# Patient Record
Sex: Female | Born: 1955 | Hispanic: Yes | Marital: Single | Smoking: Never smoker
Health system: Southern US, Community
[De-identification: ages and names within clinical notes are randomized; demographics above are authoritative.]

## PROBLEM LIST (undated history)

## (undated) DIAGNOSIS — J479 Bronchiectasis, uncomplicated: Secondary | ICD-10-CM

## (undated) DIAGNOSIS — I10 Essential (primary) hypertension: Secondary | ICD-10-CM

## (undated) HISTORY — PX: PLACEMENT OF BREAST IMPLANTS: SHX6334

## (undated) HISTORY — DX: Essential (primary) hypertension: I10

---

## 2016-11-26 DIAGNOSIS — A150 Tuberculosis of lung: Secondary | ICD-10-CM

## 2016-11-26 HISTORY — DX: Tuberculosis of lung: A15.0

## 2020-02-20 ENCOUNTER — Ambulatory Visit: Payer: Self-pay | Attending: Internal Medicine

## 2020-02-20 DIAGNOSIS — Z23 Encounter for immunization: Secondary | ICD-10-CM

## 2020-02-20 NOTE — Progress Notes (Signed)
   Covid-19 Vaccination Clinic  Name:  Victoria Mendoza    MRN: [id number] DOB: June 07, 1956  02/20/2020  [name] was observed post Covid-19 immunization for 15 minutes without incident. She was provided with Vaccine Information Sheet and instruction to access the V-Safe system.   [name] was instructed to call 911 with any severe reactions post vaccine: Marland Kitchen Difficulty breathing  . Swelling of face and throat  . A fast heartbeat  . A bad rash all over body  . Dizziness and weakness   '

## 2020-03-14 ENCOUNTER — Other Ambulatory Visit: Payer: Self-pay

## 2020-03-14 ENCOUNTER — Ambulatory Visit: Payer: 59 | Admitting: Pulmonary Disease

## 2020-03-14 ENCOUNTER — Encounter: Payer: Self-pay | Admitting: Pulmonary Disease

## 2020-03-14 ENCOUNTER — Ambulatory Visit (INDEPENDENT_AMBULATORY_CARE_PROVIDER_SITE_OTHER): Payer: 59

## 2020-03-14 VITALS — BP 122/66 | HR 87 | Temp 98.9°F | Ht 61.0 in | Wt 111.2 lb

## 2020-03-14 DIAGNOSIS — R059 Cough, unspecified: Secondary | ICD-10-CM

## 2020-03-14 DIAGNOSIS — R05 Cough: Secondary | ICD-10-CM | POA: Diagnosis not present

## 2020-03-14 LAB — BASIC METABOLIC PANEL
BUN: 9 mg/dL (ref 6–23)
CO2: 32 mEq/L (ref 19–32)
Calcium: 9.4 mg/dL (ref 8.4–10.5)
Chloride: 93 mEq/L — ABNORMAL LOW (ref 96–112)
Creatinine, Ser: 0.58 mg/dL (ref 0.40–1.20)
GFR: 104.24 mL/min (ref >60.00)
Glucose, Bld: 117 mg/dL — ABNORMAL HIGH (ref 70–99)
Potassium: 3.9 mEq/L (ref 3.5–5.1)
Sodium: 131 mEq/L — ABNORMAL LOW (ref 135–145)

## 2020-03-14 LAB — HEPATIC FUNCTION PANEL
ALT: 17 U/L (ref 0–35)
AST: 19 U/L (ref 0–37)
Albumin: 3.8 g/dL (ref 3.5–5.2)
Alkaline Phosphatase: 77 U/L (ref 39–117)
Bilirubin, Direct: 0 mg/dL (ref 0.0–0.3)
Total Bilirubin: 0.3 mg/dL (ref 0.2–1.2)
Total Protein: 8.4 g/dL — ABNORMAL HIGH (ref 6.0–8.3)

## 2020-03-14 LAB — CBC WITH DIFFERENTIAL/PLATELET
Basophils Absolute: 0.2 10*3/uL — ABNORMAL HIGH (ref 0.0–0.1)
Basophils Relative: 1.2 % (ref 0.0–3.0)
Eosinophils Absolute: 0.2 10*3/uL (ref 0.0–0.7)
Eosinophils Relative: 1.3 % (ref 0.0–5.0)
HCT: 40.1 % (ref 36.0–46.0)
Hemoglobin: 13.3 g/dL (ref 12.0–15.0)
Lymphocytes Relative: 16.9 % (ref 12.0–46.0)
Lymphs Abs: 2.2 10*3/uL (ref 0.7–4.0)
MCHC: 33.2 g/dL (ref 30.0–36.0)
MCV: 87.1 fl (ref 78.0–100.0)
Monocytes Absolute: 1.3 10*3/uL — ABNORMAL HIGH (ref 0.1–1.0)
Monocytes Relative: 10.2 % (ref 3.0–12.0)
Neutro Abs: 9.2 10*3/uL — ABNORMAL HIGH (ref 1.4–7.7)
Neutrophils Relative %: 70.4 % (ref 43.0–77.0)
Platelets: 492 10*3/uL — ABNORMAL HIGH (ref 150.0–400.0)
RBC: 4.6 Mil/uL (ref 3.87–5.11)
RDW: 13.2 % (ref 11.5–15.5)
WBC: 13 10*3/uL — ABNORMAL HIGH (ref 4.0–10.5)

## 2020-03-14 NOTE — Progress Notes (Signed)
Victoria Mendoza    [id number]    1956-08-16  Primary Care Physician:Patient, No Pcp Per  Referring Physician: No referring provider defined for this encounter.  Chief complaint:   Patient with chronic cough, fatigue  HPI: Patient was diagnosed with tuberculosis in 2018 Was treated with 6 months of antituberculous medications in [place] She stated she was given the all clear following her treatment  Chest x-ray at the time did reveal left lower lobe scarring  Cough has been for almost a couple years No significant weight loss No fevers, no chills Appetite is good No night sweats  Cough is productive of small amount of yellow occasionally green phlegm in the early morning She is not feeling acutely ill at present  She has not been around anybody with a chronic cough or febrile illness  She works as an Astronomer  Never smoked Social alcohol use, glass of wine here and there  History of hypertension-well-controlled  Outpatient Encounter Medications as of 03/14/2020  Medication Sig  . bisoprolol-hydrochlorothiazide (ZIAC) 2.5-6.25 MG tablet Take 1 tablet by mouth daily.   No facility-administered encounter medications on file as of 03/14/2020.    Allergies as of 03/14/2020  . (Not on File)    Past Medical History:  Diagnosis Date  . Hypertension     History reviewed. No pertinent surgical history.  History reviewed. No pertinent family history.  Social History   Socioeconomic History  . Marital status: Single    Spouse name: Not on file  . Number of children: Not on file  . Years of education: Not on file  . Highest education level: Not on file  Occupational History  . Not on file  Tobacco Use  . Smoking status: Never Smoker  . Smokeless tobacco: Never Used  Substance and Sexual Activity  . Alcohol use: Not on file  . Drug use: Not on file  . Sexual activity: Not on file  Other Topics Concern  . Not on file  Social History Narrative  .  Not on file   Social Determinants of Health   Financial Resource Strain:   . Difficulty of Paying Living Expenses:   Food Insecurity:   . Worried About Charity fundraiser in the Last Year:   . Arboriculturist in the Last Year:   Transportation Needs:   . Film/video editor (Medical):   Marland Kitchen Lack of Transportation (Non-Medical):   Physical Activity:   . Days of Exercise per Week:   . Minutes of Exercise per Session:   Stress:   . Feeling of Stress :   Social Connections:   . Frequency of Communication with Friends and Family:   . Frequency of Social Gatherings with Friends and Family:   . Attends Religious Services:   . Active Member of Clubs or Organizations:   . Attends Archivist Meetings:   Marland Kitchen Marital Status:   Intimate Partner Violence:   . Fear of Current or Ex-Partner:   . Emotionally Abused:   Marland Kitchen Physically Abused:   . Sexually Abused:     Review of Systems  Constitutional: Positive for fatigue.  HENT: Negative.   Eyes: Negative.   Respiratory: Positive for cough. Negative for chest tightness and wheezing.   Cardiovascular: Negative.   Gastrointestinal: Negative.   Endocrine: Negative.     Vitals:   03/14/20 1437  BP: 122/66  Pulse: 87  Temp: 98.9 F (37.2 C)  SpO2: 97%  Physical Exam  Constitutional: She appears well-developed.  HENT:  Head: Normocephalic and atraumatic.  Eyes: Pupils are equal, round, and reactive to light. Conjunctivae and EOM are normal.  Neck: No tracheal deviation present. No thyromegaly present.  Cardiovascular: Normal rate.  Pulmonary/Chest: Effort normal and breath sounds normal. No respiratory distress. She has no wheezes. She has no rales. She exhibits no tenderness.  Abdominal: Soft.  Musculoskeletal:     Cervical back: Normal range of motion and neck supple.  Neurological: She is alert. No cranial nerve deficit.  Skin: Skin is warm and dry.  Psychiatric: She has a normal mood and affect.   Assessment:    Cough Fatigue -Context of previous treatment for TB -Chronic cough -No weight loss, appetite maintained, no fevers no chills  -Has tried holistic therapy to no avail  Possibility of recurrence of TB Pneumonia Chronic bronchitis less likely  Plan/Recommendations: Sputum for AFB x3 Obtain chest x-ray Obtain BMP, CBC, LFT  CT scan will be considered depending on chest x-ray findings  Bronchoscopy discussed as an option if not able to produce adequate sputum's  Tentative follow-up in 4 weeks   Virl Diamond MD [facility] 03/14/2020, 3:03 PM  CC: No ref. provider found

## 2020-03-14 NOTE — Patient Instructions (Signed)
Cough, fatigue Previous history of treated tuberculosis  Sputum follow-up AFB and culture Chest x-ray  CBC, BMP, LFT  I will see you back in 4 weeks  Depending on chest x-ray findings, we may need a CT scan of the chest Bronchoscopy will also be considered if you are not able to provide adequate sputum samples, having the CT scan of the chest will help guide bronchoscopy sampling

## 2020-03-15 ENCOUNTER — Telehealth: Payer: Self-pay | Admitting: Pulmonary Disease

## 2020-03-15 DIAGNOSIS — J849 Interstitial pulmonary disease, unspecified: Secondary | ICD-10-CM

## 2020-03-15 NOTE — Telephone Encounter (Signed)
HRCT ordered per Dr Kary Kos request  Will await pt's call back since he has already left msg  Will try her again 4/21

## 2020-03-15 NOTE — Telephone Encounter (Signed)
Called patient, left message on generic voicemail for patient to call back  Abnormal chest x-ray with scarring Unclear what is new  As discussed during the office visit  We will go ahead and order a CT scan of the chest   Kindly go ahead and order a high-resolution CT scan of the chest-done without contrast

## 2020-03-15 NOTE — Telephone Encounter (Signed)
Called [place] [facility] and spoke to [place]. Called for a call report on pt's CXR that was completed 4/19. Impression below:   IMPRESSION: 1. Findings suggestive of extensive underlying chronic lung disease, however no prior exams are available for comparison. 2. Bilateral upper lobe bulla, left greater than right. Possible not definite fluid level in the left apical bulla. 3. Diffuse bronchial thickening and interstitial coarsening, of unknown acuity. Streaky bilateral suprahilar opacities may be atelectasis or scarring. 4. Normal heart size with bilateral hilar retraction. 5. In the absence of prior exams, consider further evaluation with chest CT. High-resolution chest CT could be considered for evaluation of interstitial lung disease.   Electronically Signed   By: Narda Rutherford M.D.   On: 03/15/2020 01:58    Dr. Wynona Neat, please advise. Thanks.

## 2020-03-16 NOTE — Telephone Encounter (Signed)
Called and spoke to pt. Informed her of the results and recs per Dr. Wynona Neat. Pt aware order has already been placed and she will be contacted soon to get the scan scheduled. Nothing further needed at this time. Will sign off.

## 2020-03-16 NOTE — Telephone Encounter (Signed)
Pt called back for results, please return call

## 2020-03-17 ENCOUNTER — Other Ambulatory Visit: Payer: 59

## 2020-03-17 DIAGNOSIS — R05 Cough: Secondary | ICD-10-CM

## 2020-03-17 DIAGNOSIS — R059 Cough, unspecified: Secondary | ICD-10-CM

## 2020-03-18 ENCOUNTER — Other Ambulatory Visit: Payer: 59

## 2020-03-18 DIAGNOSIS — R059 Cough, unspecified: Secondary | ICD-10-CM

## 2020-03-18 DIAGNOSIS — R05 Cough: Secondary | ICD-10-CM

## 2020-03-19 ENCOUNTER — Telehealth: Payer: Self-pay | Admitting: Family Medicine

## 2020-03-19 ENCOUNTER — Telehealth: Payer: Self-pay | Admitting: Pulmonary Disease

## 2020-03-19 NOTE — Telephone Encounter (Signed)
[facility] is calling to report a positive sputum Cx. 4+ acid-fast bacilli seen using fluorochrome method on 03/17/20. AFB Cx pending.  Pt does not have a PCP. Reviewing notes, she has followed with pulmonologist and testing was done because of cough for a couple of years. She has been previously treated for TB.  I could not find information about pulmonologist or ID on call.  I am routing information to Dr.Olalere.  Rosene Pilling [place], MD

## 2020-03-19 NOTE — Telephone Encounter (Signed)
Called patient today  Called LAB to request for probes - was told it will reflex to probe for all positive samples

## 2020-03-19 NOTE — Telephone Encounter (Signed)
Advised patient to isolate  She lives alone  Not in contact with anybody at present she stated  We will call the lab to request for probes for Mycobacterium and Mycobacterium TB

## 2020-03-21 ENCOUNTER — Telehealth: Payer: Self-pay | Admitting: Pulmonary Disease

## 2020-03-21 NOTE — Telephone Encounter (Signed)
Dr. O, please advise on this. 

## 2020-03-22 ENCOUNTER — Telehealth: Payer: Self-pay | Admitting: Pulmonary Disease

## 2020-03-22 NOTE — Telephone Encounter (Signed)
Spoke with [name] at [facility]. Made aware that a culture is being done as the report states that this is preliminary report and the final report could take up to 6 weeks to get back.

## 2020-03-22 NOTE — Telephone Encounter (Signed)
Pt has a f/u scheduled 5/20.  Due to the positive AFB smear, is there any current tx that pt needs to do or wait until her f/u with you to discuss?

## 2020-03-22 NOTE — Telephone Encounter (Signed)
Yes.  Patient was fully treated 2 years ago, but, risk of recurrence exists

## 2020-03-23 NOTE — Telephone Encounter (Signed)
Need to wait for the AFB probes before any intervention

## 2020-03-23 NOTE — Telephone Encounter (Signed)
Spoke with [name] and notified of response per Dr Wynona Neat  She verbalized understanding  Nothing further needed

## 2020-03-25 ENCOUNTER — Inpatient Hospital Stay: Admission: RE | Admit: 2020-03-25 | Payer: 59 | Source: Ambulatory Visit

## 2020-04-14 ENCOUNTER — Encounter: Payer: Self-pay | Admitting: Pulmonary Disease

## 2020-04-14 ENCOUNTER — Ambulatory Visit: Payer: 59 | Admitting: Pulmonary Disease

## 2020-04-14 ENCOUNTER — Other Ambulatory Visit: Payer: Self-pay

## 2020-04-14 VITALS — BP 122/68 | HR 69 | Temp 98.3°F | Ht 62.0 in | Wt 109.6 lb

## 2020-04-14 DIAGNOSIS — R0600 Dyspnea, unspecified: Secondary | ICD-10-CM

## 2020-04-14 NOTE — Patient Instructions (Signed)
Mycobacterium avium infection  We will get a CT scan of the chest Consultation with infectious disease  Will need treated with 3 drug regimen  Will follow in about 6 weeks

## 2020-04-14 NOTE — Progress Notes (Signed)
Victoria Mendoza    [id number]    [birth year]  Primary Care Physician:Patient, No Pcp Per  Referring Physician: No referring provider defined for this encounter.  Chief complaint:   Patient with chronic cough, fatigue  HPI: Symptoms are unchanged since her last visit She is still profoundly fatigued, chronic cough with expectoration's in the mornings  patient was diagnosed with tuberculosis in 2018 Was treated with 6 months of antituberculous medications in [place] She stated she was given the all clear following her treatment  Chest x-ray at the time did reveal left lower lobe scarring  Cough has been for almost a couple years No significant weight loss No fevers, no chills  Appetite is good No night sweats  Continues to feel well with no acute illness  She has not been around anybody with a chronic cough or febrile illness  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 bisoprolol-hydrochlorothiazide (ZIAC) 2.5-6.25 MG tablet Take 1 tablet by mouth daily.   No facility-administered encounter medications on file as of 04/14/2020.    Allergies as of 04/14/2020  . (Not on File)    Past Medical History:  Diagnosis Date  . Hypertension     History reviewed. No pertinent surgical history.  History reviewed. No pertinent family history.  Social History   Socioeconomic History  . Marital status: Single    Spouse name: Not on file  . Number of children: Not on file  . Years of education: Not on file  . Highest education level: Not on file  Occupational History  . Not on file  Tobacco Use  . Smoking status: Never Smoker  . Smokeless tobacco: Never Used  Substance and Sexual Activity  . Alcohol use: Not on file  . Drug use: Not on file  . Sexual activity: Not on file  Other Topics Concern  .  Not on file  Social History Narrative  . Not on file   Social Determinants of Health   Financial Resource Strain:   . Difficulty of Paying Living Expenses:   Food Insecurity:   . Worried About Charity fundraiser in the Last Year:   . Arboriculturist in the Last Year:   Transportation Needs:   . Film/video editor (Medical):   Marland Kitchen Lack of Transportation (Non-Medical):   Physical Activity:   . Days of Exercise per Week:   . Minutes of Exercise per Session:   Stress:   . Feeling of Stress :   Social Connections:   . Frequency of Communication with Friends and Family:   . Frequency of Social Gatherings with Friends and Family:   . Attends Religious Services:   . Active Member of Clubs or Organizations:   . Attends Archivist Meetings:   Marland Kitchen Marital Status:   Intimate Partner Violence:   . Fear of Current or Ex-Partner:   . Emotionally Abused:   Marland Kitchen Physically Abused:   . Sexually Abused:     Review of Systems  Constitutional: Positive for fatigue.  HENT: Negative.   Eyes: Negative.   Respiratory: Positive for cough. Negative for chest tightness and wheezing.   Cardiovascular: Negative.   Gastrointestinal: Negative.   Endocrine: Negative.     Vitals:   04/14/20 1336  BP: 122/68  Pulse: 69  Temp: 98.3 F (  36.8 C)  SpO2: 95%     Physical Exam  Constitutional: She appears well-developed.  HENT:  Head: Normocephalic and atraumatic.  Eyes: Pupils are equal, round, and reactive to light. Conjunctivae and EOM are normal.  Neck: No tracheal deviation present. No thyromegaly present.  Cardiovascular: Normal rate.  Pulmonary/Chest: Effort normal and breath sounds normal. No respiratory distress. She has no wheezes. She has no rales. She exhibits no tenderness.  Decreased air movement bilaterally  Abdominal: Soft.   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 provider found

## 2020-04-15 ENCOUNTER — Ambulatory Visit (INDEPENDENT_AMBULATORY_CARE_PROVIDER_SITE_OTHER)
Admission: RE | Admit: 2020-04-15 | Discharge: 2020-04-15 | Disposition: A | Discharge status: Home / Self Care | Payer: 59 | Source: Ambulatory Visit | Attending: Pulmonary Disease | Admitting: Pulmonary Disease

## 2020-04-15 DIAGNOSIS — J849 Interstitial pulmonary disease, unspecified: Secondary | ICD-10-CM

## 2020-04-19 ENCOUNTER — Telehealth: Payer: Self-pay | Admitting: Pulmonary Disease

## 2020-04-19 NOTE — Telephone Encounter (Signed)
Called [facility] and spoke with Nicole Cella in regards to the results of pt's mycobacteria culture. Per Nicole Cella, sputum was collected 03/17/20 and it showed many (4+) acid-fast bacilli seen.  Isolate 1: mycobacterium, avium-intracellulare group Isolate 2: Acid-fast basillus  These results can be seen under lab tab of pt's chart. Status is showing still as preliminary.  Routing to Dr. Wynona Neat.

## 2020-04-19 NOTE — Telephone Encounter (Signed)
Noted. Nothing further needed. 

## 2020-04-19 NOTE — Telephone Encounter (Signed)
Aware of results  Patient already referred to infectious disease to assist with treatment of Mycobacterium avium infection

## 2020-04-21 ENCOUNTER — Encounter: Payer: Self-pay | Admitting: Internal Medicine

## 2020-04-21 ENCOUNTER — Other Ambulatory Visit: Payer: Self-pay

## 2020-04-21 ENCOUNTER — Ambulatory Visit (INDEPENDENT_AMBULATORY_CARE_PROVIDER_SITE_OTHER): Payer: 59 | Admitting: Internal Medicine

## 2020-04-21 DIAGNOSIS — I10 Essential (primary) hypertension: Secondary | ICD-10-CM

## 2020-04-21 DIAGNOSIS — A310 Pulmonary mycobacterial infection: Secondary | ICD-10-CM

## 2020-04-21 DIAGNOSIS — I70209 Unspecified atherosclerosis of native arteries of extremities, unspecified extremity: Secondary | ICD-10-CM | POA: Diagnosis not present

## 2020-04-21 DIAGNOSIS — Z8611 Personal history of tuberculosis: Secondary | ICD-10-CM | POA: Diagnosis not present

## 2020-04-21 MED ORDER — AZITHROMYCIN 500 MG PO TABS: 500.0000 mg | ORAL_TABLET | Freq: Every day | ORAL | 11 refills | Status: DC

## 2020-04-21 MED ORDER — ETHAMBUTOL HCL 400 MG PO TABS: ORAL_TABLET | ORAL | 11 refills | Status: DC

## 2020-04-21 MED ORDER — RIFAMPIN 300 MG PO CAPS: 600.0000 mg | ORAL_CAPSULE | Freq: Every day | ORAL | 11 refills | Status: DC

## 2020-04-21 NOTE — Progress Notes (Signed)
[facility]  Reason for Consult: Mycobacterium avium pneumonia Referring Provider: Dr. 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  Lab to order antibiotic susceptibilities for her isolate.  She will follow-up in 4 to 6 weeks.  Plan: 1. Start daily azithromycin, rifampin and ethambutol (I have talked to her about potential side effects) 2. Antibiotic susceptibilities 3. Follow-up in 4 to 6 weeks  Patient Active Problem List   Diagnosis Date Noted  . 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 History of tuberculosis 04/21/2020  . Hypertension 04/21/2020  . Atherosclerotic peripheral vascular disease (HCC) 04/21/2020    Patient's Medications  New Prescriptions   AZITHROMYCIN (ZITHROMAX) 500 MG TABLET    Take 1 tablet (500 mg total) by mouth daily.   ETHAMBUTOL (MYAMBUTOL) 400 MG TABLET    Take 2 tablets (800 mg total) by mouth daily, THEN 2 tablets (800 mg total) daily.   RIFAMPIN (RIFADIN) 300 MG CAPSULE    Take 2 capsules (600 mg total) by mouth daily.  Previous Medications   BISOPROLOL-HYDROCHLOROTHIAZIDE (ZIAC) 2.5-6.25 MG TABLET    Take 1 tablet by mouth daily.  Modified Medications   No medications on file  Discontinued Medications   No medications on file    HPI: Victoria Mendoza is a 64 y.o. female with a history of hypertension and tuberculosis.  She developed active tuberculosis in 2005 and received 6 months of multidrug treatment.  She was deemed to have been cured.  However, she had a relapse of tuberculosis in 2018 and received a second 36-month course of therapy, again with a believe that she was cured.  She received these treatments while living in her native [place].  She now  works locally as a Research officer, trade union.  She says other than her hypertension and tuberculosis she has been very healthy throughout her life.  She loves to exercise and stays very active but several months ago began to notice that her chronic cough was getting worse and it was more productive of green sputum.  She has also noticed worsening dyspnea on exertion and fatigue.  She has had to give up many of her routine activities.  She has not had any hemoptysis.  She was evaluated last month and a chest x-ray showed extensive chronic lung disease bilaterally.  Chest CT scan showed extensive bilateral cavitation and nodularity.  Sputum AFB culture has grown Mycobacterium avium.  Review of Systems: Review of Systems  Constitutional: Positive for malaise/fatigue and weight loss. Negative for chills, diaphoresis and fever.  HENT: Negative for sore throat.   Respiratory: Positive for cough, sputum production, shortness of breath and wheezing. Negative for hemoptysis.   Cardiovascular: Negative for chest pain.  Gastrointestinal: Negative for abdominal pain, diarrhea, nausea and vomiting.  Genitourinary: Negative for dysuria.  Musculoskeletal: Negative for back pain.  Skin: Negative for rash.  Neurological: Negative for headaches.  Psychiatric/Behavioral: Negative for depression.      Past Medical History:  Diagnosis Date  . Hypertension     Social History   Tobacco Use  . Smoking status: Never Smoker  . Smokeless tobacco: Never Used  Substance Use Topics  . Alcohol use: Never  . Drug use: Never    History reviewed. No pertinent family history. No Known Allergies  OBJECTIVE: Vitals:   04/21/20 1450  BP: 138/72  Pulse: 90  Temp: 97.8 F (36.6 C)  SpO2: 98%  Weight: 111 lb (50.3 kg)  Height: 5\' 1"  (1.549 m)   Body mass index is 20.97 kg/m.   Physical Exam Constitutional:      Comments: She is very pleasant and in no distress.  Cardiovascular:     Rate and Rhythm: Normal  rate and regular rhythm.     Heart sounds: No murmur.  Pulmonary:     Effort: Pulmonary effort is normal.     Breath sounds: Wheezing and rales present.  Abdominal:     Palpations: Abdomen is soft.     Tenderness: There is no abdominal tenderness.  Musculoskeletal:        General: No swelling or tenderness.  Skin:    Findings: No rash.  Neurological:     General: No focal deficit present.  Psychiatric:        Mood and Affect: Mood normal.     Michel Bickers, MD [facility] for [place] [phone] pager   [phone] cell 04/21/2020, 3:11 PM

## 2020-04-22 ENCOUNTER — Telehealth: Payer: Self-pay | Admitting: *Deleted

## 2020-04-22 MED FILL — rifAMPin 300 MG CAPS: 300 | 30 days supply | Qty: 60 | Fill #0

## 2020-04-22 MED FILL — AZITHROMYCIN 500 MG TABS: 500 | 30 days supply | Qty: 30 | Fill #0

## 2020-04-22 NOTE — Telephone Encounter (Signed)
Received notice from [facility] pharmacy that ethambutol is not on formulary. Will send prior authorization request to [facility]. Andree Coss, RN

## 2020-04-22 NOTE — Telephone Encounter (Signed)
PA initiated via covermymeds for myambutol. Andree Coss, RN

## 2020-04-26 MED FILL — ETHAMBUTOL HCL 400 MG TAB: 400 | 15 days supply | Qty: 60 | Fill #0

## 2020-04-26 NOTE — Telephone Encounter (Signed)
[facility] Patient Advocate Encounter  Response from [facility] indicates the medication needs to be processed generic. I ran a claim at [facility] and it went through for $5.00. Communicated this information to the pharmacy and they are reprocessing. The medication in stock at [facility] may alter the patient's copay amount.   A new prior authorization is not needed at this time. Patient assistance can be looked at if needed through [facility].

## 2020-05-17 ENCOUNTER — Telehealth: Payer: Self-pay | Admitting: Pulmonary Disease

## 2020-05-17 NOTE — Telephone Encounter (Signed)
Called [facility]  The sputum culture for mycobacteria  fluorchrome smear from 03/17/20 was pos for MAC  Will forward to Dr Ander Slade to make him aware

## 2020-05-18 MED FILL — ETHAMBUTOL HCL 400 MG TAB: 400 | 15 days supply | Qty: 60 | Fill #1

## 2020-05-18 MED FILL — rifAMPin 300 MG CAPS: 300 | 30 days supply | Qty: 60 | Fill #1

## 2020-05-18 MED FILL — AZITHROMYCIN 500 MG TABS: 500 | 30 days supply | Qty: 30 | Fill #1

## 2020-05-18 NOTE — Telephone Encounter (Signed)
aware

## 2020-05-18 NOTE — Telephone Encounter (Signed)
ATC [facility], was placed on a long hold. Will try back.

## 2020-05-18 NOTE — Telephone Encounter (Signed)
Dawn from [employer] [facility] is calling with lab results. Reference number is [id number] N, Phone number is [phone].

## 2020-05-19 ENCOUNTER — Telehealth: Payer: Self-pay | Admitting: Pulmonary Disease

## 2020-05-19 NOTE — Telephone Encounter (Signed)
error 

## 2020-06-06 ENCOUNTER — Other Ambulatory Visit: Payer: Self-pay

## 2020-06-06 ENCOUNTER — Encounter: Payer: Self-pay | Admitting: Internal Medicine

## 2020-06-06 ENCOUNTER — Ambulatory Visit (INDEPENDENT_AMBULATORY_CARE_PROVIDER_SITE_OTHER): Payer: 59 | Admitting: Internal Medicine

## 2020-06-06 DIAGNOSIS — A310 Pulmonary mycobacterial infection: Secondary | ICD-10-CM

## 2020-06-06 NOTE — Assessment & Plan Note (Signed)
She is tolerating her new 3 drug regimen well and is better.  She will continue her antibiotics submit a new sputum sample for repeat culture.  She will follow-up here in 4 to 6 weeks.

## 2020-06-06 NOTE — Progress Notes (Signed)
[facility]  Patient Active Problem List   Diagnosis Date Noted  . 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 History of tuberculosis 04/21/2020  . Hypertension 04/21/2020  . Atherosclerotic peripheral vascular disease (HCC) 04/21/2020    Patient's Medications  New Prescriptions   No medications on file  Previous Medications   AZITHROMYCIN (ZITHROMAX) 500 MG TABLET    Take 1 tablet (500 mg total) by mouth daily.   BISOPROLOL-HYDROCHLOROTHIAZIDE (ZIAC) 2.5-6.25 MG TABLET    Take 1 tablet by mouth daily.   ETHAMBUTOL (MYAMBUTOL) 400 MG TABLET    Take 2 tablets (800 mg total) by mouth daily, THEN 2 tablets (800 mg total) daily.   RIFAMPIN (RIFADIN) 300 MG CAPSULE    Take 2 capsules (600 mg total) by mouth daily.  Modified Medications   No medications on file  Discontinued Medications   No medications on file    Subjective: Victoria Mendoza is in for her initial follow-up visit.  She started taking daily azithromycin, ethambutol and rifampin on 04/21/2020 for her Mycobacterium avium pneumonia.  She had some nausea and diarrhea during the first week but it resolved spontaneously and she has had no further problems tolerating her antibiotics.  She is feeling much better.  Her cough has lessened and she is only bringing up very small amount of clear sputum.  She still has some dyspnea on exertion but that is better as well when she is back working out in the gym.  Review of Systems: Review of Systems  Constitutional: Negative for chills, diaphoresis and fever.  Respiratory: Positive for cough, sputum production and shortness of breath. Negative for hemoptysis.   Cardiovascular: Negative for chest pain.  Gastrointestinal: Negative for abdominal pain, diarrhea, nausea and vomiting.    Past Medical History:  Diagnosis Date  . Hypertension     Social History   Tobacco Use  . Smoking status: Never Smoker  . Smokeless tobacco:  Never Used  Substance Use Topics  . Alcohol use: Never  . Drug use: Never    No family history on file.  No Known Allergies  Objective: Vitals:   06/06/20 1524  BP: 137/81  Pulse: 85  SpO2: 92%  Weight: 112 lb 3.2 oz (50.9 kg)   Body mass index is 21.2 kg/m.  Physical Exam Constitutional:      Comments: She is very cheerful and talkative.  Cardiovascular:     Rate and Rhythm: Normal rate and regular rhythm.     Heart sounds: No murmur heard.   Pulmonary:     Effort: Pulmonary effort is normal.     Breath sounds: Rales present. No wheezing.     Comments: He has a few scattered crackles bilaterally. Skin:    Findings: No rash.  Psychiatric:        Mood and Affect: Mood normal.     Lab Results    Problem List Items Addressed This Visit      High   Mycobacterium avium infection (HCC)    She is tolerating her new 3 drug regimen well and is better.  She will continue her antibiotics submit a new sputum sample for repeat culture.  She will follow-up here in 4 to 6 weeks.      Relevant Orders   MYCOBACTERIA, CULTURE, WITH FLUOROCHROME SMEAR       Cliffton Asters, MD [facility] for Infect[facility] [phone] pager  [phone] cell 06/06/2020, 3:42 PM

## 2020-06-09 ENCOUNTER — Other Ambulatory Visit: Payer: Self-pay

## 2020-06-09 ENCOUNTER — Telehealth: Payer: Self-pay

## 2020-06-09 ENCOUNTER — Other Ambulatory Visit: Payer: 59

## 2020-06-09 DIAGNOSIS — A310 Pulmonary mycobacterial infection: Secondary | ICD-10-CM

## 2020-06-09 LAB — SUSCEPT AFB, RAPID REFLEX
Amikacin: 8 ug/mL
Ceoxitin: 32 ug/mL
Ciprofloxacin: >4 ug/mL
Clarithromycin: >16 ug/mL
Doxycycline: >16 ug/mL
Imipenem: 16 ug/mL
Linezolid: 16 ug/mL
Minocycline: >8 ug/mL
Moxifloxxacin: >8 ug/mL
Tigecycline: 0.5 ug/mL

## 2020-06-09 LAB — MYCOBACTERIA,CULT W/FLUOROCHROME SMEAR
MICRO NUMBER:: 10394547
SPECIMEN QUALITY:: ADEQUATE

## 2020-06-09 LAB — MYCOBACTERIAL IDENTIFICATION AND SUSCEPTIBILITY

## 2020-06-09 NOTE — Telephone Encounter (Signed)
Patient came in today to drop off sputum culture and requested to speak to a nurse. Patient complains of having intermittent  dyspnea and SOB. Patient reports taking her antibiotics and directed. Patient has been advised if the SOB and the dyspnea worsens she needs to go to the ER. Patient wanted to make sure that her symptoms are normal for her with her current diagnosis of MAC. Patient reassured that this is normal and to continue antibiotics. We will call with sputum culture results Jenie Parish T Brooks Sailors

## 2020-06-13 LAB — M. AVIUM MIC PANEL
AMIKACIN: 32
CIPROFLOXACIN: 16
CLARITHROMYCIN: 1
ETHAMBUTOL: 8
ETHIONAMIDE: 10
ISONIAZID: 4
LINEZOLID: 32
MOXIFLOXACIN: 4
RIFABUTIN: <=0.25
RIFAMPIN: 2
STREPTOMYCIN: 64

## 2020-06-20 ENCOUNTER — Telehealth: Payer: Self-pay

## 2020-06-20 MED FILL — rifAMPin 300 MG CAPS: 300 | 30 days supply | Qty: 60 | Fill #2

## 2020-06-20 MED FILL — ETHAMBUTOL HCL 400 MG TAB: 400 | 15 days supply | Qty: 60 | Fill #2

## 2020-06-20 MED FILL — AZITHROMYCIN 500 MG TABS: 500 | 30 days supply | Qty: 30 | Fill #2

## 2020-06-20 NOTE — Telephone Encounter (Signed)
Received preliminary Sputum culture results. Routing to Dr. Orvan Falconer to review. Valarie Cones

## 2020-06-23 ENCOUNTER — Telehealth (HOSPITAL_COMMUNITY): Payer: Self-pay | Admitting: Pulmonary Disease

## 2020-06-30 ENCOUNTER — Telehealth: Payer: Self-pay | Admitting: *Deleted

## 2020-06-30 NOTE — Telephone Encounter (Signed)
[facility] called regarding critical lab value, MAC found growing in sputum. 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 Landis Gandy, RN

## 2020-07-15 ENCOUNTER — Encounter (HOSPITAL_COMMUNITY): Payer: Self-pay

## 2020-07-15 ENCOUNTER — Other Ambulatory Visit: Payer: Self-pay

## 2020-07-15 ENCOUNTER — Ambulatory Visit (HOSPITAL_COMMUNITY)
Admission: EM | Admit: 2020-07-15 | Discharge: 2020-07-15 | Disposition: A | Discharge status: Home / Self Care | Payer: 59 | Attending: Family Medicine | Admitting: Family Medicine

## 2020-07-15 DIAGNOSIS — R300 Dysuria: Secondary | ICD-10-CM | POA: Insufficient documentation

## 2020-07-15 LAB — POCT URINALYSIS DIPSTICK, ED / UC
Bilirubin Urine: NEGATIVE
Glucose, UA: NEGATIVE mg/dL
Hgb urine dipstick: NEGATIVE
Ketones, ur: NEGATIVE mg/dL
Leukocytes,Ua: NEGATIVE
Nitrite: NEGATIVE
Protein, ur: NEGATIVE mg/dL
Specific Gravity, Urine: 1.02 (ref 1.005–1.030)
Urobilinogen, UA: 0.2 mg/dL (ref 0.0–1.0)
pH: 7 (ref 5.0–8.0)

## 2020-07-15 MED ORDER — TRIAMCINOLONE ACETONIDE 0.025 % EX OINT: 1.0000 "application " | TOPICAL_OINTMENT | Freq: Two times a day (BID) | CUTANEOUS | 0 refills | Status: DC

## 2020-07-15 MED ORDER — TRIAMCINOLONE ACETONIDE 0.025 % EX OINT: 1.0000 "application " | TOPICAL_OINTMENT | Freq: Two times a day (BID) | CUTANEOUS | 0 refills | Status: AC

## 2020-07-15 NOTE — ED Provider Notes (Signed)
[facility]    CSN: [id number] Arrival date & time: 07/15/20  1851      History   Chief Complaint Chief Complaint  Patient presents with  . Dysuria    HPI Victoria Mendoza is a 64 y.o. female.   HPI  Patient is a some burning with urine for about 3 days.  It is minor.  It is different for her since she is never had a bladder infection.  No vaginal discharge.  She has not noticed any suprapubic pressure pain.  No fever chills.  No flank pain. She is under the care of infectious disease for a Mycobacterium infection.  On multiple antibiotics.  Tolerating them well.  Past Medical History:  Diagnosis Date  . Hypertension     Patient Active Problem List   Diagnosis Date Noted  . 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 History of tuberculosis 04/21/2020  . Hypertension 04/21/2020  . Atherosclerotic peripheral vascular disease (HCC) 04/21/2020    History reviewed. No pertinent surgical history.  OB History   No obstetric history on file.      Home Medications    Prior to Admission medications   Medication Sig Start Date End Date Taking? Authorizing Provider  azithromycin (ZITHROMAX) 500 MG tablet Take 1 tablet (500 mg total) by mouth daily. 04/21/20  Yes Cliffton Asters, MD  ethambutol (MYAMBUTOL) 400 MG tablet Take 2 tablets (800 mg total) by mouth daily, THEN 2 tablets (800 mg total) daily. 04/21/20 04/21/22 Yes Cliffton Asters, MD  rifampin (RIFADIN) 300 MG capsule Take 2 capsules (600 mg total) by mouth daily. 04/21/20  Yes Cliffton Asters, MD  bisoprolol-hydrochlorothiazide [facility]) 2.5-6.25 MG tablet Take 1 tablet by mouth daily.    [provider]  triamcinolone (KENALOG) 0.025 % ointment Apply 1 application topically 2 (two) times daily. 07/15/20   Eustace Moore, MD    Family History History reviewed. No pertinent family history.  Social History Social History   Tobacco Use  . Smoking status: Never Smoker  . Smokeless tobacco: Never  Used  Substance Use Topics  . Alcohol use: Never  . Drug use: Never     Allergies   Patient has no known allergies.   Review of Systems Review of Systems See HPI  Physical Exam Triage Vital Signs ED Triage Vitals  Enc Vitals Group     BP 07/15/20 2019 (!) 157/84     Pulse Rate 07/15/20 2019 82     Resp 07/15/20 2019 18     Temp 07/15/20 2019 98.9 F (37.2 C)     Temp Source 07/15/20 2019 Oral     SpO2 07/15/20 2019 97 %     Weight --      Height --      Head Circumference --      Peak Flow --      Pain Score 07/15/20 2018 0     Pain Loc --      Pain Edu? --      Excl. in GC? --    No data found.  Updated Vital Signs BP (!) 157/84 (BP Location: Right Arm)   Pulse 82   Temp 98.9 F (37.2 C) (Oral)   Resp 18   SpO2 97%      Physical Exam Constitutional:      General: She is not in acute distress.    Appearance: She is well-developed and normal weight.     Comments: Articulate and pleasant  HENT:  Head: Normocephalic and atraumatic.     Mouth/Throat:     Comments: Mask is in place Eyes:     Conjunctiva/sclera: Conjunctivae normal.     Pupils: Pupils are equal, round, and reactive to light.  Cardiovascular:     Rate and Rhythm: Normal rate.  Pulmonary:     Effort: Pulmonary effort is normal. No respiratory distress.  Musculoskeletal:        General: Normal range of motion.     Cervical back: Normal range of motion.  Skin:    General: Skin is warm and dry.  Neurological:     Mental Status: She is alert.  Psychiatric:        Mood and Affect: Mood normal.        Behavior: Behavior normal.      UC Treatments / Results  Labs (all labs ordered are listed, but only abnormal results are displayed) Labs Reviewed  URINE CULTURE  POCT URINALYSIS DIPSTICK, ED / UC    EKG   Radiology No results found.  Procedures Procedures (including critical care time)  Medications Ordered in UC Medications - No data to display  Initial Impression /  Assessment and Plan / UC Course  I have reviewed the triage vital signs and the nursing notes.  Pertinent labs & imaging results that were available during my care of the patient were reviewed by me and considered in my medical decision making (see chart for details).     Urinalysis is negative.  Discussed other causes for dysuria.  May have some atrophic vaginitis.  Recommend a trial of Kenalog ointment.  Follow-up with PCP.  I will culture her urine based on her symptoms Final Clinical Impressions(s) / UC Diagnoses   Final diagnoses:  Dysuria     Discharge Instructions     Drink more water Use ointment 2 x a day as needed for discomfort A prescription has been sent to your pharmacy    ED Prescriptions    Medication Sig Dispense Auth. Provider   triamcinolone (KENALOG) 0.025 % ointment Apply 1 application topically 2 (two) times daily. 30 g Eustace Moore, MD     PDMP not reviewed this encounter.   Eustace Moore, MD 07/15/20 [phone]

## 2020-07-15 NOTE — Discharge Instructions (Addendum)
Drink more water Use ointment 2 x a day as needed for discomfort A prescription has been sent to your pharmacy

## 2020-07-15 NOTE — ED Triage Notes (Signed)
Pt c/o burning with urination for approx 3 days. Denies urgency, frequency, n/v/d, fever, chills, abdominal/flank/back pain or vaginal discharge.

## 2020-07-18 LAB — URINE CULTURE
Culture: 40000 — AB
Special Requests: NORMAL

## 2020-07-19 ENCOUNTER — Encounter: Payer: Self-pay | Admitting: Internal Medicine

## 2020-07-19 ENCOUNTER — Ambulatory Visit (INDEPENDENT_AMBULATORY_CARE_PROVIDER_SITE_OTHER): Payer: 59 | Admitting: Internal Medicine

## 2020-07-19 ENCOUNTER — Other Ambulatory Visit: Payer: Self-pay

## 2020-07-19 DIAGNOSIS — A310 Pulmonary mycobacterial infection: Secondary | ICD-10-CM | POA: Diagnosis not present

## 2020-07-19 DIAGNOSIS — A318 Other mycobacterial infections: Secondary | ICD-10-CM

## 2020-07-19 DIAGNOSIS — A319 Mycobacterial infection, unspecified: Secondary | ICD-10-CM

## 2020-07-19 NOTE — Progress Notes (Addendum)
Victoria Mendoza for Infectious Disease  Patient Active Problem List   Diagnosis Date Noted  . 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 Mycobacterium avium infection ([place]) 04/21/2020    Priority: High  . History of tuberculosis 04/21/2020  . Hypertension 04/21/2020  . Atherosclerotic peripheral vascular disease ([place]) 04/21/2020    Patient's Medications  New Prescriptions   No medications on file  Previous Medications   AZITHROMYCIN (ZITHROMAX) 500 MG TABLET    Take 1 tablet (500 mg total) by mouth daily.   BISOPROLOL-HYDROCHLOROTHIAZIDE (ZIAC) 2.5-6.25 MG TABLET    Take 1 tablet by mouth daily.   ETHAMBUTOL (MYAMBUTOL) 400 MG TABLET    Take 2 tablets (800 mg total) by mouth daily, THEN 2 tablets (800 mg total) daily.   RIFAMPIN (RIFADIN) 300 MG CAPSULE    Take 2 capsules (600 mg total) by mouth daily.   TRIAMCINOLONE (KENALOG) 0.025 % OINTMENT    Apply 1 application topically 2 (two) times daily.  Modified Medications   No medications on file  Discontinued Medications   No medications on file    Subjective: Victoria Mendoza is in for her routine follow-up visit.  She started on azithromycin, ethambutol and rifampin on 04/21/2020 for Mycobacterium avium pneumonia.  She has had some problems with diarrhea that occurs 1 to 2 hours after she takes her medication in the morning.  She is not having any problems with nausea or vomiting.  She says her cough is about 90% improved.  She is only bringing up a very scant amount of sputum.  She has not had any improvement in her dyspnea on exertion but is able to go to the gym to exercise.  Repeat sputum culture on 06/09/2020 grew Mycobacterium avium again.  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  Review of Systems: Review of Systems  Constitutional: Positive for malaise/fatigue. Negative for chills, diaphoresis, fever and weight loss.  Respiratory: Positive for cough, sputum production, shortness of breath and  wheezing. Negative for hemoptysis.   Cardiovascular: Negative for chest pain.  Gastrointestinal: Positive for diarrhea. Negative for abdominal pain, nausea and vomiting.    Past Medical History:  Diagnosis Date  . Hypertension     Social History   Tobacco Use  . Smoking status: Never Smoker  . Smokeless tobacco: Never Used  Substance Use Topics  . Alcohol use: Never  . Drug use: Never    No family history on file.  No Known Allergies  Objective: Vitals:   07/19/20 1537  BP: (!) 149/80  Pulse: 84  SpO2: 96%  Weight: 113 lb (51.3 kg)   Body mass index is 21.35 kg/m.  Physical Exam Constitutional:      Comments: She is in good spirits as usual.  Her weight is up 2 pounds.  Cardiovascular:     Rate and Rhythm: Normal rate and regular rhythm.     Heart sounds: No murmur heard.   Pulmonary:     Effort: Pulmonary effort is normal.     Breath sounds: Wheezing present. No rhonchi or rales.  Abdominal:     Palpations: Abdomen is soft.     Tenderness: There is no abdominal tenderness.  Psychiatric:        Mood and Affect: Mood normal.       Problem List Items Addressed This Visit      High   Mycobacterium avium infection ([place])    Her sputum culture was still positive for mycobacterium avium  6 weeks into therapy.  She feels like she cannot tolerate the side effects of a.m. diarrhea.  She will get a repeat sputum culture today and continue her current regimen.  She will follow-up in 2 months.      Relevant Orders   MYCOBACTERIA, CULTURE, WITH FLUOROCHROME SMEAR   Mycobacterium abscessus infection    Her situation has just become significantly more complicated by the isolation of Mycobacterium abscessus.  I will need to review antibiotic susceptibilities before making recommendations about a change in her antibiotic regimen.        Addendum: Issues the E consult I received from [facility]  Dear Dr. Megan Salon, The first step is to make sure this  patient does not have TB thoroughly with 3 sputum cultures for AFB, 2 of which should have MTB PCR and preferably Xpert to determine if there is rifampin resistance. Recurrent and prior TB is a risk factor for having drug-resistant TB. I would also have her family in [place] obtain the records of her treatment and also susceptibility results on her TB organisms. 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 You need further sputum cultures (by induction if necessary) ASAP also for her NTM management. There are 2 ways forward - the first is to stop antibiotics until several other sputa have been obtained to try to identify a dominate pathogen to target. The other is to continue your MAC treatment - ordinarily I would not recommend this because 1) your treatment may mask growth once started making it difficult to tell if you are dealing with MAC or Abscessus (or both), and also the risk of inducing M. abscessus macrolide resistance with an incomplete regimen. In her case, you may wish to continue MAC therapy because, 1) she seems to have extensive disease and delaying could worsen this, and 2) her abscessus seems to be macrolide resistant already. With regard to the latter you also should make efforts to identify the presence of a functional erm gene for inducible macrolide resistance (either by direct molecular detection or 14-day incubation of the macrolide - you should request that one or both be done on the specimen you have now and also others going forward). This is because the ability to use macrolide substantially increases antibiotic effectiveness. We do have a way to treat both abscessus and MAC if there is ongoing growth of both but it is a complicated regimen - you may contact me again in that scenario. She should also be started on regular twice daily use of flutter valve and hypertonic saline (with teaching by respiratory therapy) which is the management of bronchiectasis  regardless of NT M - with her cavities a vest is also indicated. Clearance should be done before eating/drinking or 3 hours after to avoid reflux and aspiration. You should obtain baseline audiogram and testing of visual acuity and color vision at treatment start and then vision testing every 3 months on ethambutol. She also has indication for IV amikacin (for both MAC and M abscessus) with her cavitary lesion - usually dosed at 64m/kg MWF with adjustment by drug levels. She should have monthly audiograms on IV amikacin. It is important to confirm your amikacin MIC for the MAC with subsequent cultures as 32 is intermediate (we do not use amikacin IV at MIC 64 but can still do inhaled). Cure is unlikely without surgical resection of the cavity which should be contemplated assessment takes into account the location/focality of  cavitary or severe bronchiectasis, pulmonary and cardiac function, amongst other factors. IV amikacin is most important 6-8 weeks before and 6-8 weeks after surgery (i.e., it is sometimes not started until a surgical date is made since risk of hearing loss increases with cumulative use). If she continues to grow abscessus contact me for treatment recommendations. I can be reached at [email or web address] or [phone].      Michel Bickers, MD [facility] [phone] pager   [phone] cell 07/19/2020, 4:09 PM

## 2020-07-19 NOTE — Assessment & Plan Note (Addendum)
Her sputum culture was still positive for mycobacterium avium 6 weeks into therapy.  She feels like she cannot tolerate the side effects of a.m. diarrhea.  She will get a repeat sputum culture today and continue her current regimen.  She will follow-up in 2 months.

## 2020-07-19 NOTE — Assessment & Plan Note (Signed)
Her situation has just become significantly more complicated by the isolation of Mycobacterium abscessus.  I will need to review antibiotic susceptibilities before making recommendations about a change in her antibiotic regimen.

## 2020-07-20 ENCOUNTER — Ambulatory Visit: Payer: 59 | Admitting: Pulmonary Disease

## 2020-07-20 MED FILL — ETHAMBUTOL HCL 400 MG TAB: 400 | 15 days supply | Qty: 60 | Fill #3

## 2020-07-20 MED FILL — rifAMPin 300 MG CAPS: 300 | 30 days supply | Qty: 60 | Fill #3

## 2020-07-20 MED FILL — AZITHROMYCIN 500 MG TABS: 500 | 30 days supply | Qty: 30 | Fill #3

## 2020-07-22 ENCOUNTER — Telehealth (HOSPITAL_COMMUNITY): Payer: Self-pay | Admitting: Emergency Medicine

## 2020-07-22 ENCOUNTER — Other Ambulatory Visit: Payer: Self-pay

## 2020-07-22 ENCOUNTER — Other Ambulatory Visit: Payer: 59

## 2020-07-22 DIAGNOSIS — A310 Pulmonary mycobacterial infection: Secondary | ICD-10-CM

## 2020-07-22 MED ORDER — NITROFURANTOIN MONOHYD MACRO 100 MG PO CAPS: 100.0000 mg | ORAL_CAPSULE | Freq: Two times a day (BID) | ORAL | 0 refills | Status: DC

## 2020-07-22 MED ORDER — SULFAMETHOXAZOLE-TRIMETHOPRIM 400-80 MG PO TABS: 1.0000 | ORAL_TABLET | Freq: Two times a day (BID) | ORAL | 0 refills | Status: AC

## 2020-07-22 MED FILL — SULFAMETHOXAZOLE-TMP SS TAB: 400-80 | 3 days supply | Qty: 6 | Fill #0

## 2020-07-22 NOTE — Addendum Note (Signed)
Addended by: Harley Alto on: 07/22/2020 09:36 AM   Modules accepted: Orders

## 2020-07-22 NOTE — Telephone Encounter (Signed)
Patient called and states her insurance will not cover Macrobid.  Sent in Bactrim DS per protocol instead.

## 2020-07-27 LAB — MYCOBACTERIA,CULT W/FLUOROCHROME SMEAR
MICRO NUMBER:: 10714394
SPECIMEN QUALITY:: ADEQUATE

## 2020-07-27 LAB — CP MYCOBACTERIAL IDENTIFICATION

## 2020-08-04 ENCOUNTER — Telehealth: Payer: Self-pay

## 2020-08-04 NOTE — Telephone Encounter (Signed)
Opened in error

## 2020-08-04 NOTE — Telephone Encounter (Signed)
Patient states she has been feeling faint at times and her head is feeling "foggy" denies any SOB, cough, n/v/d. States she feels very tired more recently over the past two weeks.  States she has been going to the gym lately and maybe should increase her water intake. She thinks it could possibly be the medication that is making her feel this way. Patient states she is taking the medication on an empty stomach and recently treated herself with probiotics at home for yeast infection. Denies any recent covid test. States she received J&J vaccine back in march 2020.  Routing to MD for possible related to medication Valarie Cones

## 2020-08-05 NOTE — Telephone Encounter (Signed)
Called patient to discuss. No answer, will try again Monday

## 2020-08-08 ENCOUNTER — Ambulatory Visit (HOSPITAL_COMMUNITY)
Admission: EM | Admit: 2020-08-08 | Discharge: 2020-08-08 | Disposition: A | Discharge status: Home / Self Care | Payer: 59 | Attending: Emergency Medicine | Admitting: Emergency Medicine

## 2020-08-08 ENCOUNTER — Other Ambulatory Visit: Payer: Self-pay

## 2020-08-08 ENCOUNTER — Encounter (HOSPITAL_COMMUNITY): Payer: Self-pay | Admitting: Emergency Medicine

## 2020-08-08 DIAGNOSIS — R5383 Other fatigue: Secondary | ICD-10-CM | POA: Diagnosis not present

## 2020-08-08 LAB — CBC WITH DIFFERENTIAL/PLATELET
Abs Immature Granulocytes: 0.03 10*3/uL (ref 0.00–0.07)
Basophils Absolute: 0.1 10*3/uL (ref 0.0–0.1)
Basophils Relative: 1 %
Eosinophils Absolute: 0.3 10*3/uL (ref 0.0–0.5)
Eosinophils Relative: 4 %
HCT: 43.3 % (ref 36.0–46.0)
Hemoglobin: 14.6 g/dL (ref 12.0–15.0)
Immature Granulocytes: 0 %
Lymphocytes Relative: 21 %
Lymphs Abs: 1.9 10*3/uL (ref 0.7–4.0)
MCH: 30.1 pg (ref 26.0–34.0)
MCHC: 33.7 g/dL (ref 30.0–36.0)
MCV: 89.3 fL (ref 80.0–100.0)
Monocytes Absolute: 0.7 10*3/uL (ref 0.1–1.0)
Monocytes Relative: 9 %
Neutro Abs: 5.7 10*3/uL (ref 1.7–7.7)
Neutrophils Relative %: 65 %
Platelets: 386 10*3/uL (ref 150–400)
RBC: 4.85 MIL/uL (ref 3.87–5.11)
RDW: 13.2 % (ref 11.5–15.5)
WBC: 8.7 10*3/uL (ref 4.0–10.5)
nRBC: 0 % (ref 0.0–0.2)

## 2020-08-08 LAB — TSH: TSH: 2.819 u[IU]/mL (ref 0.350–4.500)

## 2020-08-08 LAB — COMPREHENSIVE METABOLIC PANEL
ALT: 34 U/L (ref 0–44)
AST: 27 U/L (ref 15–41)
Albumin: 3.9 g/dL (ref 3.5–5.0)
Alkaline Phosphatase: 76 U/L (ref 38–126)
Anion gap: 10 (ref 5–15)
BUN: 9 mg/dL (ref 8–23)
CO2: 28 mmol/L (ref 22–32)
Calcium: 9.4 mg/dL (ref 8.9–10.3)
Chloride: 91 mmol/L — ABNORMAL LOW (ref 98–111)
Creatinine, Ser: 0.66 mg/dL (ref 0.44–1.00)
GFR calc Af Amer: >60 mL/min (ref >60)
GFR calc non Af Amer: >60 mL/min (ref >60)
Glucose, Bld: 87 mg/dL (ref 70–99)
Potassium: 3.7 mmol/L (ref 3.5–5.1)
Sodium: 129 mmol/L — ABNORMAL LOW (ref 135–145)
Total Bilirubin: 0.7 mg/dL (ref 0.3–1.2)
Total Protein: 8.3 g/dL — ABNORMAL HIGH (ref 6.5–8.1)

## 2020-08-08 NOTE — ED Triage Notes (Signed)
Complains of fatigue.  Patient reports taking strong antibiotics June 2nd and at that time started having weakness and fatigue.  Patient's primary provider knows of her complaints.  Patient says provider is going to change antibiotics (treating a lung infection).  Patient is on Ethambutol Rifampin Azithromycin  Patient has been on medications for 2-3 months.  Patient has felt worse recently.  No pain

## 2020-08-08 NOTE — ED Provider Notes (Signed)
[facility]    CSN: [id number] Arrival date & time: 08/08/20  1607      History   Chief Complaint Chief Complaint  Patient presents with   Dizziness    HPI Victoria Mendoza is a 64 y.o. female.   Victoria Mendoza presents with complaints of increased fatigue over the past 2 weeks. Today she felt like she was "going to faint."  Currently feels a bit better. Spoke with I/D Dr. Orvan Falconer today, he is going to change her medications.  No chest pain , no shortness of breath . Feels a heaviness to her shoulders. She has felt this way since onset of medications in June, but worsening. She is currently taking rifampin, azithromycin, and ethambutol for mycobacterium abscessus. Has been taking probioitics which has helped with diarrhea associated with these antibiotics. Her stools have significantly improved. Abdominal cramping has diminished. Cough has much improved. No vomiting. No fevers. No cold sensitivity. Has lost a few pounds but states she recently improved her diet. She does not have a PCP.    ROS per HPI, negative if not otherwise mentioned.      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 No pertinent surgical history.  OB History   No obstetric history on file.      Home Medications    Prior to Admission medications   Medication Sig Start Date End Date Taking? Authorizing Provider  azithromycin (ZITHROMAX) 500 MG tablet Take 1 tablet (500 mg total) by mouth daily. 04/21/20   Cliffton Asters, MD  bisoprolol-hydrochlorothiazide [facility]) 2.5-6.25 MG tablet Take 1 tablet by mouth daily.    [provider]  ethambutol (MYAMBUTOL) 400 MG tablet Take  2 tablets (800 mg total) by mouth daily, THEN 2 tablets (800 mg total) daily. 04/21/20 04/21/22  Cliffton Asters, MD  rifampin (RIFADIN) 300 MG capsule Take 2 capsules (600 mg total) by mouth daily. 04/21/20   Cliffton Asters, MD  triamcinolone (KENALOG) 0.025 % ointment Apply 1 application topically 2 (two) times daily. Patient not taking: Reported on 07/19/2020 07/15/20   Eustace Moore, MD    Family History Family History  Problem Relation Age of Onset   Cancer Mother    Heart attack Father     Social History Social History   Tobacco Use   Smoking status: Never Smoker   Smokeless tobacco: Never Used  Substance Use Topics   Alcohol use: Never   Drug use: Never     Allergies   Patient has no known allergies.   Review of Systems Review of Systems   Physical Exam Triage Vital Signs ED Triage Vitals  Enc Vitals Group     BP 08/08/20 1822 117/78     Pulse Rate 08/08/20 1822 77     Resp 08/08/20 1822 20     Temp 08/08/20 1822 98.4 F (36.9 C)     Temp Source 08/08/20 1822 Oral     SpO2 08/08/20 1822 97 %     Weight --      Height --      Head Circumference --      Peak Flow --      Pain Score 08/08/20 1818 0  Pain Loc --      Pain Edu? --      Excl. in GC? --    No data found.  Updated Vital Signs BP 117/78 (BP Location: Right Arm)    Pulse 77    Temp 98.4 F (36.9 C) (Oral)    Resp 20    SpO2 97%   Visual Acuity Right Eye Distance:   Left Eye Distance:   Bilateral Distance:    Right Eye Near:   Left Eye Near:    Bilateral Near:     Physical Exam Constitutional:      General: She is not in acute distress.    Appearance: She is well-developed.  Cardiovascular:     Rate and Rhythm: Normal rate.  Pulmonary:     Effort: Pulmonary effort is normal.  Skin:    General: Skin is warm and dry.  Neurological:     Mental Status: She is alert and oriented to person, place, and time.      UC Treatments / Results  Labs (all labs ordered are  listed, but only abnormal results are displayed) Labs Reviewed  COMPREHENSIVE METABOLIC PANEL - Abnormal; Notable for the following components:      Result Value   Sodium 129 (*)    Chloride 91 (*)    Total Protein 8.3 (*)    All other components within normal limits  CBC WITH DIFFERENTIAL/PLATELET  TSH    EKG   Radiology No results found.  Procedures Procedures (including critical care time)  Medications Ordered in UC Medications - No data to display  Initial Impression / Assessment and Plan / UC Course  I have reviewed the triage vital signs and the nursing notes.  Pertinent labs & imaging results that were available during my care of the patient were reviewed by me and considered in my medical decision making (see chart for details).     Fatigue and sensation of going to pass out earlier today. 2 weeks of symptoms. No other new complaints. She is on long term antibiotics and follows with infectious disease, related to mycobacterium. Basic labs and tsh collected to rule out metabolic sources of fatigue. Emphasized close follow up with her provider, and encouraged establish with PCP as well. Patient verbalized understanding and agreeable to plan.  Return precautions provided. Ambulatory out of clinic without difficulty.    Final Clinical Impressions(s) / UC Diagnoses   Final diagnoses:  Fatigue, unspecified type     Discharge Instructions     I am collecting a few blood tests today to evaluate your symptoms.  I will call you if there are any concerning findings.  If normal or otherwise without concern to your results, we will not call you. Please log on to your MyChart to review your results if interested in so.   Please follow up with your Infectious Disease team or establish with a primary care provider if symptoms persist.  Please return for any worsening of symptoms    ED Prescriptions    None     PDMP not reviewed this encounter.   Georgetta Haber,  NP 08/09/20 2149

## 2020-08-08 NOTE — Discharge Instructions (Addendum)
I am collecting a few blood tests today to evaluate your symptoms.  I will call you if there are any concerning findings.  If normal or otherwise without concern to your results, we will not call you. Please log on to your MyChart to review your results if interested in so.   Please follow up with your Infectious Disease team or establish with a primary care provider if symptoms persist.  Please return for any worsening of symptoms

## 2020-08-09 ENCOUNTER — Telehealth: Payer: Self-pay

## 2020-08-09 NOTE — Telephone Encounter (Signed)
Incoming call from [facility] with Prelim reports of mycobacteria culture isolate acid-fast bacill present. Identification to follow. Routing to MD to review results in epic.  Victoria Mendoza

## 2020-08-10 ENCOUNTER — Telehealth: Payer: Self-pay | Admitting: Internal Medicine

## 2020-08-10 NOTE — Telephone Encounter (Signed)
I called and spoke to daughter Victoria Mendoza today.  She was seen in urgent care 2 days ago because of increasing weakness.  She was found to be hyponatremic with a sodium of 129.  She still feels very weak.  She is trying to increase her sodium intake.  She has not had any fever or change in her chronic cough and shortness of breath.  She feels like she is tolerating her antibiotics well.  I talked to her again about how difficult her current situation is.  She has pneumonia and AFB cultures have grown Mycobacterium avium and a very multidrug-resistant Mycobacterium abscessus.  I wonder if her history of "tuberculosis" in 2005 in 2018 may have been first episodes of her current pneumonia. Regardless coming up with a safe and effective treatment regimen is going to be extremely difficult.  I have asked for an E consult from [facility] in [place], [place].  I told [place] Mendoza that I will call her as soon as I hear from them.

## 2020-08-11 NOTE — Telephone Encounter (Signed)
Where you able to connect with patient?

## 2020-08-11 NOTE — Telephone Encounter (Signed)
Thank you :)

## 2020-08-11 NOTE — Telephone Encounter (Addendum)
Yes! I called her on Monday and discussed with Dr. Orvan Falconer. He put a phone note in yesterday regarding her treatment.   Very hard situation with Epifania and her cultures. Dr. Orvan Falconer will consult [facility]. I recommended either:    - IV amikacin + IV cefoxitin + oral omadacycline + oral clofazimine  Or  - IV amikacin + IV tigecycline + oral clofazimine + oral bedaquilline  He will follow up once consult from [place] has returned.

## 2020-08-17 MED FILL — rifAMPin 300 MG CAPS: 300 | 30 days supply | Qty: 60 | Fill #4

## 2020-08-17 MED FILL — AZITHROMYCIN 500 MG TABLET: 500 | 30 days supply | Qty: 30 | Fill #4

## 2020-08-18 ENCOUNTER — Other Ambulatory Visit: Payer: Self-pay | Admitting: Internal Medicine

## 2020-08-18 DIAGNOSIS — A310 Pulmonary mycobacterial infection: Secondary | ICD-10-CM

## 2020-08-18 MED ORDER — ETHAMBUTOL HCL 400 MG PO TABS: ORAL_TABLET | ORAL | 11 refills | Status: DC

## 2020-08-18 MED ORDER — AZITHROMYCIN 500 MG PO TABS: 500.0000 mg | ORAL_TABLET | Freq: Every day | ORAL | 11 refills | Status: DC

## 2020-08-18 MED ORDER — RIFAMPIN 300 MG PO CAPS: 600.0000 mg | ORAL_CAPSULE | Freq: Every day | ORAL | 11 refills | Status: DC

## 2020-08-18 MED FILL — ETHAMBUTOL HCL 400 MG TAB: 400 | 15 days supply | Qty: 60 | Fill #0

## 2020-08-31 ENCOUNTER — Ambulatory Visit: Payer: 59 | Admitting: Pulmonary Disease

## 2020-08-31 ENCOUNTER — Encounter: Payer: Self-pay | Admitting: Pulmonary Disease

## 2020-08-31 ENCOUNTER — Other Ambulatory Visit: Payer: Self-pay

## 2020-08-31 VITALS — BP 126/76 | HR 80 | Temp 97.4°F | Ht 62.0 in | Wt 112.0 lb

## 2020-08-31 DIAGNOSIS — A318 Other mycobacterial infections: Secondary | ICD-10-CM

## 2020-08-31 DIAGNOSIS — R0602 Shortness of breath: Secondary | ICD-10-CM

## 2020-08-31 DIAGNOSIS — A310 Pulmonary mycobacterial infection: Secondary | ICD-10-CM | POA: Diagnosis not present

## 2020-08-31 DIAGNOSIS — Z8611 Personal history of tuberculosis: Secondary | ICD-10-CM | POA: Diagnosis not present

## 2020-08-31 DIAGNOSIS — A319 Mycobacterial infection, unspecified: Secondary | ICD-10-CM | POA: Diagnosis not present

## 2020-08-31 NOTE — Patient Instructions (Signed)
Mycobacterium avium Mycobacterium abscessus  Check in weekly with infectious disease for any updates  Both organisms need treated, the difficulty is how to find a combination of medications that does not place you at risk and be able to treat both  Consulting with other physicians in this situation is appropriate as the infectious disease doctor is doing as this are not commonly occurring infections and they involve complex management decisions especially where multiple antibiotics are involved  Call with any significant concerns  I will follow-up with you in about 6 months, sooner if you have any questions or concerns

## 2020-08-31 NOTE — Progress Notes (Signed)
Victoria Mendoza    [id number]    05-25-1956  Primary Care Physician:Patient, No Pcp Per  Referring Physician: No referring provider defined for this encounter.  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 Orvan Falconer requested for second opinion regarding treatment, awaiting to hear back  She is tired, feels the current medications are sapping her energy but she remains compliant  She does have a cough Sputum production is significantly improved  Has been on antibiotics now for about 4 months  patient was diagnosed with tuberculosis in 2018 Was treated with 6 months of antituberculous medications in [place] She stated she was given the all clear following her treatment  Chest x-ray at the time did reveal left lower lobe scarring  Cough has been for almost a couple years No significant weight loss No fevers, no chills  Appetite is good No night sweats  Continues to feel well with no acute illness  She has not been around anybody with a chronic cough or febrile illness  She works as an Equities trader  Never smoked Social alcohol use, glass of wine here and there  History of hypertension-well-controlled  Outpatient Encounter Medications as of 08/31/2020  Medication Sig  . azithromycin (ZITHROMAX) 500 MG tablet Take 1 tablet (500 mg total) by mouth daily.  . bisoprolol-hydrochlorothiazide (ZIAC) 2.5-6.25 MG tablet Take 1 tablet by mouth daily.  Marland Kitchen ethambutol (MYAMBUTOL) 400 MG tablet Take 2 tablets (800 mg total) by mouth daily, THEN 2 tablets (800 mg total) daily.  . rifampin (RIFADIN) 300 MG capsule Take 2 capsules (600 mg total) by mouth daily.  Marland Kitchen triamcinolone (KENALOG) 0.025 % ointment Apply 1 application topically 2 (two) times daily.   No facility-administered encounter medications on file as of 08/31/2020.    Allergies  as of 08/31/2020  . (No Known Allergies)    Past Medical History:  Diagnosis Date  . Hypertension     No past surgical history on file.  Family History  Problem Relation Age of Onset  . Cancer Mother   . Heart attack Father     Social History   Socioeconomic History  . Marital status: Single    Spouse name: Not on file  . Number of children: Not on file  . Years of education: Not on file  . Highest education level: Not on file  Occupational History  . Not on file  Tobacco Use  . Smoking status: Never Smoker  . Smokeless tobacco: Never Used  Substance and Sexual Activity  . Alcohol use: Never  . Drug use: Never  . Sexual activity: Not on file  Other Topics Concern  . Not on file  Social History Narrative  . Not on file   Social Determinants of Health   Financial Resource Strain:   . Difficulty of Paying Living Expenses: Not on file  Food Insecurity:   . Worried About Programme researcher, broadcasting/film/video in the Last Year: Not on file  . Ran Out of Food in the Last Year: Not on file  Transportation Needs:   . Lack of Transportation (Medical): Not on file  . Lack of Transportation (Non-Medical): Not on file  Physical Activity:   . Days of Exercise per Week: Not on file  . Minutes of Exercise per Session: Not on file  Stress:   . Feeling of Stress : Not on file  Social Connections:   . Frequency of Communication with Friends and Family: Not on file  . Frequency of Social Gatherings with Friends and Family: Not on file  . Attends Religious Services: Not on file  . Active Member of Clubs or Organizations: Not on file  . Attends Banker Meetings: Not on file  . Marital Status: Not on file  Intimate Partner Violence:   . Fear of Current or Ex-Partner: Not on file  . Emotionally Abused: Not on file  . Physically Abused: Not on file  . Sexually Abused: Not on file    Review of Systems  Constitutional: Positive for fatigue.  HENT: Negative.   Eyes: Negative.     Respiratory: Positive for cough. Negative for chest tightness and wheezing.   Cardiovascular: Negative.   Gastrointestinal: Negative.   Endocrine: Negative.     Vitals:   08/31/20 1205  BP: 126/76  Pulse: 80  Temp: (!) 97.4 F (36.3 C)  SpO2: 95%     Physical Exam Constitutional:      Appearance: She is well-developed.  HENT:     Head: Normocephalic and atraumatic.  Eyes:     Pupils: Pupils are equal, round, and reactive to light.  Neck:     Thyroid: No thyromegaly.     Trachea: No tracheal deviation.  Cardiovascular:     Rate and Rhythm: Normal rate and regular rhythm.  Pulmonary:     Effort: Pulmonary effort is normal. No respiratory distress.     Breath sounds: Normal breath sounds. No wheezing or rales.  Chest:     Chest wall: No tenderness.    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 Blair Dolphin notes reviewed   Virl Diamond MD [facility] 08/31/2020, 12:33 PM  CC: No ref. provider found

## 2020-09-07 ENCOUNTER — Other Ambulatory Visit: Payer: Self-pay

## 2020-09-07 ENCOUNTER — Telehealth: Payer: Self-pay | Admitting: Internal Medicine

## 2020-09-07 ENCOUNTER — Telehealth: Payer: Self-pay | Admitting: Pulmonary Disease

## 2020-09-07 ENCOUNTER — Encounter: Payer: Self-pay | Admitting: Internal Medicine

## 2020-09-07 ENCOUNTER — Ambulatory Visit (INDEPENDENT_AMBULATORY_CARE_PROVIDER_SITE_OTHER): Payer: 59 | Admitting: Internal Medicine

## 2020-09-07 DIAGNOSIS — R5383 Other fatigue: Secondary | ICD-10-CM

## 2020-09-07 DIAGNOSIS — A310 Pulmonary mycobacterial infection: Secondary | ICD-10-CM | POA: Diagnosis not present

## 2020-09-07 DIAGNOSIS — A318 Other mycobacterial infections: Secondary | ICD-10-CM

## 2020-09-07 DIAGNOSIS — A319 Mycobacterial infection, unspecified: Secondary | ICD-10-CM

## 2020-09-07 NOTE — Telephone Encounter (Signed)
Call patient  as a follow-up to her recent visit to Dr. Orvan Falconer    .  Needs to come to the office for evaluation for oxygen supplementation -Needs to be walked to see if she qualifies for oxygen supplementation-portable concentrator will likely work best  .  Prescription for nebulizer .  Hypertonic saline to be used via nebulizer 3 times daily to aid with expectoration of secretions  .  Prescription for flutter device-through DME company  .  Follow-up appointment with one of the APPs or myself

## 2020-09-07 NOTE — Telephone Encounter (Signed)
-----   Message from Tomma Lightning, MD sent at 09/07/2020  2:56 PM EDT ----- Regarding: RE: We can try go get her to come in to qualify for oxygen- not sure it'll do much for her as she is getting frail from general failure thriving.  Poor appetite.  She does not like to use medications generally- saline nebulizations will only help if having difficulty clearing secretions and she had said when I last saw her- she's not having a lot of mucus production.  Will try and put things in place, anything that may help her some.   Thanks.  [place]. ----- Message ----- From: Cliffton Asters, MD Sent: 09/07/2020   2:43 PM EDT To: Tomma Lightning, MD  Thanks for seeing Topaz recently.  I saw her again today after recently receiving a response to my E consult request [facility].  I routed my note to you today.  Today her room air oxygen saturation at rest was 93% but dropped to 88% during a slow stroll around our clinic.  Do you think that she would benefit from supplemental oxygen, aerosolized saline and/or a flutter valve?  Kiah Vanalstine [phone]

## 2020-09-07 NOTE — Assessment & Plan Note (Addendum)
As noted in Dr. Sharrie Rothman response to my E consult request, this is a very complex and difficult situation.  I feel quite certain that Victoria Mendoza is very adherent with her current 3 drug antibiotic regimen.  Her initial isolate did not express any macrolide resistance but she has continued to grow Mycobacterium avium 4 months into antibiotic therapy.  Repeat antibiotic susceptibility results are pending.  I will obtain lab work and a repeat sputum AFB culture today.  She will continue on her current 3 drug regimen.  She develops oxygen desaturation with minimal exertion.  I will reach out to her pulmonologist to see if she would benefit from supplemental oxygen, aerosolized hypertonic saline and/or flutter valve.  She has applied for Medicaid/Medicare and would like to hold off on ophthalmology evaluation (for possible color vision changes secondary to ethambutol) until she has insurance. She will follow-up in 3 weeks.

## 2020-09-07 NOTE — Assessment & Plan Note (Signed)
I suspect that her worsening fatigue is due more to the oxygen desaturation with minimal exertion rather than side effects of her antibiotic.

## 2020-09-07 NOTE — Assessment & Plan Note (Signed)
It is unclear what role Mycobacterium abscessus is playing.  I am surprised that Mycobacterium abscessus was not isolated on her most recent culture although she is currently not on therapy for it.  I will obtain a repeat culture today and I will discuss the case by phone with Dr. Link Snuffer before making a final decision on treatment for Mycobacterium abscessus.  She has applied for Medicaid/Medicare and would like to hold off on audiogram testing until she has insurance.

## 2020-09-07 NOTE — Progress Notes (Addendum)
[facility]  Patient Active Problem List   Diagnosis Date Noted  . Fatigue 09/07/2020    Priority: High  . 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 Mycobacterium avium infection ([place]) 04/21/2020    Priority: High  . History of tuberculosis 04/21/2020  . Hypertension 04/21/2020  . Atherosclerotic peripheral vascular disease ([place]) 04/21/2020    Patient's Medications  New Prescriptions   No medications on file  Previous Medications   AZITHROMYCIN (ZITHROMAX) 500 MG TABLET    Take 1 tablet (500 mg total) by mouth daily.   BISOPROLOL-HYDROCHLOROTHIAZIDE (ZIAC) 2.5-6.25 MG TABLET    Take 1 tablet by mouth daily.   ETHAMBUTOL (MYAMBUTOL) 400 MG TABLET    Take 2 tablets (800 mg total) by mouth daily, THEN 2 tablets (800 mg total) daily.   RIFAMPIN (RIFADIN) 300 MG CAPSULE    Take 2 capsules (600 mg total) by mouth daily.   TRIAMCINOLONE (KENALOG) 0.025 % OINTMENT    Apply 1 application topically 2 (two) times daily.  Modified Medications   No medications on file  Discontinued Medications   No medications on file    Subjective: Victoria Mendoza is in for her routine follow-up visit.  She started on azithromycin, ethambutol and rifampin on 04/21/2020 for Mycobacterium avium pneumonia.  She is not having any problems with nausea or vomiting.  She says her cough is about 90% improved.  She is only bringing up a very scant amount of sputum.  She has not had any improvement in her dyspnea on exertion.  She exercises regularly at home.  She says that overall she is better since starting antibiotics except for worsening fatigue.   She does not think that there is any way to obtain records of her TB isolate and treatment while she was living in her native [place], [place].  However she says that she does not recall being told that there was any difficulty in treating and curing her infection.  She was not ever told that there was any  resistance to her isolate.  Her recent sputum culture results are confusing.    03/17/2020 Mycobacterium avium was isolated and reported first.  Mycobacterium abscessus was isolated later and addended at the bottom of the report without a call to [place].   06/09/2020 Mycobacterium avium and Mycobacterium abscessus were isolated again. 07/22/2020 Mycobacterium avium was the only isolate (repeat susceptibilities are pending).   I recently received this correspondence back from Dr. Thayer Jew, from [facility] in [employer] [place], [place].  Dear Dr. Megan Salon, The first step is to make sure this patient does not have TB thoroughly with 3 sputum cultures for AFB, 2 of which should have MTB PCR and preferably Xpert to determine if there is rifampin resistance. Recurrent and prior TB is a risk factor for having drug-resistant TB. I would also have her family in [place] obtain the records of her treatment and also susceptibility results on her TB organisms. 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 You need further sputum cultures (by induction if necessary) ASAP also for her NTM management. There are 2 ways forward - the first is to stop antibiotics until several other sputa have been obtained to try to identify a dominate pathogen to target. The other is to continue your MAC treatment - ordinarily I would not recommend this because 1) your treatment may mask growth once started making  it difficult to tell if you are dealing with MAC or Abscessus (or both), and also the risk of inducing M. abscessus macrolide resistance with an incomplete regimen. In her case, you may wish to continue MAC therapy because, 1) she seems to have extensive disease and delaying could worsen this, and 2) her abscessus seems to be macrolide resistant already. With regard to the latter you also should make efforts to identify the presence of a functional erm gene for inducible macrolide resistance (either by direct  molecular detection or 14-day incubation of the macrolide - you should request that one or both be done on the specimen you have now and also others going forward). This is because the ability to use macrolide substantially increases antibiotic effectiveness. We do have a way to treat both abscessus and MAC if there is ongoing growth of both but it is a complicated regimen - you may contact me again in that scenario. She should also be started on regular twice daily use of flutter valve and hypertonic saline (with teaching by respiratory therapy) which is the management of bronchiectasis regardless of NT M - with her cavities a vest is also indicated. Clearance should be done before eating/drinking or 3 hours after to avoid reflux and aspiration. You should obtain baseline audiogram and testing of visual acuity and color vision at treatment start and then vision testing every 3 months on ethambutol. She also has indication for IV amikacin (for both MAC and M abscessus) with her cavitary lesion - usually dosed at 42m/kg MWF with adjustment by drug levels. She should have monthly audiograms on IV amikacin. It is important to confirm your amikacin MIC for the MAC with subsequent cultures as 32 is intermediate (we do not use amikacin IV at MIC 64 but can still do inhaled). Cure is unlikely without surgical resection of the cavity which should be contemplated assessment takes into account the location/focality of cavitary or severe bronchiectasis, pulmonary and cardiac function, amongst other factors. IV amikacin is most important 6-8 weeks before and 6-8 weeks after surgery (i.e., it is sometimes not started until a surgical date is made since risk of hearing loss increases with cumulative use). If she continues to grow abscessus contact me for treatment recommendations. I can be reached at [email or web address] or [phone] Dr. JThayer Jew   Review of Systems: Review of Systems  Constitutional: Positive for  malaise/fatigue. Negative for chills, diaphoresis, fever and weight loss.  Respiratory: Positive for cough, sputum production and shortness of breath. Negative for hemoptysis and wheezing.   Cardiovascular: Negative for chest pain.  Gastrointestinal: Positive for diarrhea. Negative for abdominal pain, nausea and vomiting.  Psychiatric/Behavioral: Negative for depression.    Past Medical History:  Diagnosis Date  . Hypertension     Social History   Tobacco Use  . Smoking status: Never Smoker  . Smokeless tobacco: Never Used  Substance Use Topics  . Alcohol use: Never  . Drug use: Never    Family History  Problem Relation Age of Onset  . Cancer Mother   . Heart attack Father     No Known Allergies  Objective: Vitals:   09/07/20 1338  BP: (!) 150/84  Pulse: 88  Temp: (!) 97.4 F (36.3 C)  TempSrc: Oral  Weight: 112 lb 8 oz (51 kg)   Body mass index is 20.58 kg/m.  Physical Exam Constitutional:      Comments: She is in good spirits as usual.  Her weight is unchanged.  Cardiovascular:     Rate and Rhythm: Normal rate and regular rhythm.     Heart sounds: No murmur heard.   Pulmonary:     Effort: Pulmonary effort is normal.     Breath sounds: No wheezing, rhonchi or rales.     Comments: Her room air oxygen saturation was 93% at rest and dropped to 88% with a gentle walk around the clinic. Abdominal:     Palpations: Abdomen is soft.     Tenderness: There is no abdominal tenderness.  Psychiatric:        Mood and Affect: Mood normal.       Problem List Items Addressed This Visit      High   Mycobacterium avium infection ([place])    As noted in Dr. Elease Hashimoto response to my E consult request, this is a very complex and difficult situation.  I feel quite certain that Eric is very adherent with her current 3 drug antibiotic regimen.  Her initial isolate did not express any macrolide resistance but she has continued to grow Mycobacterium avium 4 months into antibiotic  therapy.  Repeat antibiotic susceptibility results are pending.  I will obtain lab work and a repeat sputum AFB culture today.  She will continue on her current 3 drug regimen.  She develops oxygen desaturation with minimal exertion.  I will reach out to her pulmonologist to see if she would benefit from supplemental oxygen, aerosolized hypertonic saline and/or flutter valve.  She has applied for Medicaid/Medicare and would like to hold off on ophthalmology evaluation (for possible color vision changes secondary to ethambutol) until she has insurance. She will follow-up in 3 weeks.      Relevant Orders   CBC   Comprehensive metabolic panel   MYCOBACTERIA, CULTURE, WITH FLUOROCHROME SMEAR   Mycobacterium abscessus infection    It is unclear what role Mycobacterium abscessus is playing.  I am surprised that Mycobacterium abscessus was not isolated on her most recent culture although she is currently not on therapy for it.  I will obtain a repeat culture today and I will discuss the case by phone with Dr. Ludwig Clarks before making a final decision on treatment for Mycobacterium abscessus.  She has applied for Medicaid/Medicare and would like to hold off on audiogram testing until she has insurance.      Fatigue    I suspect that her worsening fatigue is due more to the oxygen desaturation with minimal exertion rather than side effects of her antibiotic.        Plan: 1. Continue current 3 drug antibiotic regimen for now 2. Await results of susceptibility testing on 07/22/2020 Mycobacterium avium isolate 3. Repeat sputum AFB culture today 4. Check CBC and CMP 5. Reviewed potential benefit of supplemental oxygen, aerosolized hypertonic saline and flutter valve with her pulmonologist, Dr.Adewale Olalere 6. Call and discuss the case with Dr. Minerva Fester 7. Hold off on ophthalmology exam and audiogram until she has insurance coverage 8. Follow-up here in 3 weeks     Michel Bickers, MD [facility] for  [place] [phone] pager   [phone] cell 09/07/2020, 2:38 PM

## 2020-09-08 ENCOUNTER — Other Ambulatory Visit: Payer: Self-pay | Admitting: Pulmonary Disease

## 2020-09-08 LAB — CBC
HCT: 44.2 % (ref 35.0–45.0)
Hemoglobin: 15.1 g/dL (ref 11.7–15.5)
MCH: 30.8 pg (ref 27.0–33.0)
MCHC: 34.2 g/dL (ref 32.0–36.0)
MCV: 90 fL (ref 80.0–100.0)
MPV: 9.7 fL (ref 7.5–12.5)
Platelets: 413 10*3/uL — ABNORMAL HIGH (ref 140–400)
RBC: 4.91 10*6/uL (ref 3.80–5.10)
RDW: 12.6 % (ref 11.0–15.0)
WBC: 8.3 10*3/uL (ref 3.8–10.8)

## 2020-09-08 LAB — COMPREHENSIVE METABOLIC PANEL
AG Ratio: 1.1 (calc) (ref 1.0–2.5)
ALT: 35 U/L — ABNORMAL HIGH (ref 6–29)
AST: 26 U/L (ref 10–35)
Albumin: 4.2 g/dL (ref 3.6–5.1)
Alkaline phosphatase (APISO): 79 U/L (ref 37–153)
BUN: 16 mg/dL (ref 7–25)
CO2: 30 mmol/L (ref 20–32)
Calcium: 9.7 mg/dL (ref 8.6–10.4)
Chloride: 96 mmol/L — ABNORMAL LOW (ref 98–110)
Creat: 0.64 mg/dL (ref 0.50–0.99)
Globulin: 3.7 g/dL (calc) (ref 1.9–3.7)
Glucose, Bld: 98 mg/dL (ref 65–99)
Potassium: 4.2 mmol/L (ref 3.5–5.3)
Sodium: 133 mmol/L — ABNORMAL LOW (ref 135–146)
Total Bilirubin: 0.6 mg/dL (ref 0.2–1.2)
Total Protein: 7.9 g/dL (ref 6.1–8.1)

## 2020-09-08 MED ORDER — SODIUM CHLORIDE 3 % IN NEBU: INHALATION_SOLUTION | RESPIRATORY_TRACT | 12 refills | Status: DC

## 2020-09-08 MED FILL — SODIUM CHLORIDE 3 % NEBU: 3 | 28 days supply | Qty: 720 | Fill #0

## 2020-09-08 NOTE — Telephone Encounter (Signed)
Spoke with the pt and notified of recommendations per Dr Wynona Neat  She verbalized understanding  Appt scheduled with [facility] to see if qualifies for o2  She will obtain flutter at her appt with Arlys John  Rx for hypertonic solution sent to [facility] and order to [facility] for nebs

## 2020-09-12 ENCOUNTER — Ambulatory Visit (INDEPENDENT_AMBULATORY_CARE_PROVIDER_SITE_OTHER): Payer: 59 | Admitting: Pulmonary Disease

## 2020-09-12 ENCOUNTER — Other Ambulatory Visit: Payer: Self-pay

## 2020-09-12 ENCOUNTER — Encounter: Payer: Self-pay | Admitting: Pulmonary Disease

## 2020-09-12 VITALS — BP 124/70 | HR 77 | Temp 97.2°F | Ht 62.0 in | Wt 113.0 lb

## 2020-09-12 DIAGNOSIS — A319 Mycobacterial infection, unspecified: Secondary | ICD-10-CM | POA: Diagnosis not present

## 2020-09-12 DIAGNOSIS — J479 Bronchiectasis, uncomplicated: Secondary | ICD-10-CM | POA: Diagnosis not present

## 2020-09-12 DIAGNOSIS — A310 Pulmonary mycobacterial infection: Secondary | ICD-10-CM | POA: Diagnosis not present

## 2020-09-12 DIAGNOSIS — A318 Other mycobacterial infections: Secondary | ICD-10-CM

## 2020-09-12 MED ORDER — SODIUM CHLORIDE 3 % IN NEBU: INHALATION_SOLUTION | Freq: Three times a day (TID) | RESPIRATORY_TRACT | 12 refills | Status: DC

## 2020-09-12 MED FILL — AZITHROMYCIN 500 MG TABS: 500 | 30 days supply | Qty: 30 | Fill #5

## 2020-09-12 MED FILL — ETHAMBUTOL HCL 400 MG TAB: 400 | 30 days supply | Qty: 60 | Fill #1

## 2020-09-12 MED FILL — rifAMPin 300 MG CAPS: 300 | 30 days supply | Qty: 60 | Fill #5

## 2020-09-12 NOTE — Patient Instructions (Addendum)
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 Megan Salon  Continue ethambutol Continue rifampin Continue azithromycin  When you obtain health insurance coverage please notify infectious disease as well as our office we would recommend at that time that you obtain ophthalmology evaluation as well as an audiology evaluation  3. Bronchiectasis without complication (HCC)  Bronchiectasis: This is the medical term which indicates that you have damage, dilated airways making you more susceptible to respiratory infection. Use a flutter valve 10 breaths three times a day to help clear mucus out Let [facility] Mendoza know if you have cough with change in mucus color or fevers or chills.  At that point you would need an antibiotic. Maintain a healthy nutritious diet, eating whole foods Take your medications as prescribed   Start hypertonic saline nebs every 8 hours  Send in prescription for nebulizer  Follow Up:    No follow-ups on file.   Notification of test results are managed in the following manner: If there are  any recommendations or changes to the  plan of care discussed in office today,  we will contact you and let you know what they are. If you do not hear from Victoria Mendoza, then your results are normal and you can view them through your  MyChart account , or a letter will be sent to you. Thank you again for trusting [facility] Mendoza with your care  - Thank you, [facility]    It is flu season:   >>> Best ways to protect herself from the flu: Receive the yearly flu vaccine, practice good hand hygiene washing with soap and also using hand sanitizer when available, eat a nutritious meals, get adequate rest, hydrate appropriately       Please contact the office if your symptoms worsen or you have concerns that you are not improving.   Thank you for choosing [facility] for  your healthcare, and for allowing [facility] to partner with you on your healthcare journey. I am thankful to be able to provide care to you today.   Wyn Quaker FNP-C    Bronchiectasis  Bronchiectasis is a condition in which the airways in the lungs (bronchi) are damaged and widened. The condition makes it hard for the lungs to get rid of mucus, and it causes mucus to gather in the bronchi. This condition often leads to lung infections, which can make the condition worse. What are the causes? You can be born with this condition or you can develop it later in life. Common causes of this condition include:  Cystic fibrosis.  Repeated lung infections, such as pneumonia or tuberculosis.  An object or other blockage in the lungs.  Breathing in fluid, food, or other objects (aspiration).  A problem with the immune system and lung structure that is present at birth (congenital). Sometimes the cause is not known. What are the signs or symptoms? Common symptoms of this condition include:  A daily cough that brings up mucus and lasts for more than 3 weeks.  Lung infections that happen often.  Shortness of breath and wheezing.  Weakness and fatigue. How is this diagnosed? This condition is diagnosed with tests, such as:  Chest X-rays or CT scans. These are done to check for changes in the lungs.  Breathing tests. These are done to check how well your lungs are working.  A test of a sample of  your saliva (sputum culture). This test is done to check for infection.  Blood tests and other tests. These are done to check for related diseases or causes. How is this treated? Treatment for this condition depends on the severity of the illness and its cause. Treatment may include:  Medicines that loosen mucus so it can be coughed up (expectorants).  Medicines that relax the muscles of the bronchi (bronchodilators).  Antibiotic medicines to prevent or treat infection.  Physical therapy to help  clear mucus from the lungs. Techniques may include: ? Postural drainage. This is when you sit or lie in certain positions so that mucus can drain by gravity. ? Chest percussion. This involves tapping the chest or back with a cupped hand. ? Chest vibration. For this therapy, a hand or special equipment vibrates your chest and back.  Surgery to remove the affected part of the lung. This may be done in severe cases. Follow these instructions at home: Medicines  Take over-the-counter and prescription medicines only as told by your health care provider.  If you were prescribed an antibiotic medicine, take it as told by your health care provider. Do not stop taking the antibiotic even if you start to feel better.  Avoid taking sedatives and antihistamines unless your health care provider tells you to take them. These medicines tend to thicken the mucus in the lungs. Managing symptoms  Perform breathing exercises or techniques to clear your lungs as told by your health care provider.  Consider using a cold steam vaporizer or humidifier in your room or home to help loosen secretions.  If you have a cough that gets worse at night, try sleeping in a semi-upright position. General instructions  Get plenty of rest.  Drink enough fluid to keep your urine clear or pale yellow.  Stay inside when pollution and ozone levels are high.  Stay up to date with vaccinations and immunizations.  Avoid cigarette smoke and other lung irritants.  Do not use any products that contain nicotine or tobacco, such as cigarettes and e-cigarettes. If you need help quitting, ask your health care provider.  Keep all follow-up visits as told by your health care provider. This is important. Contact a health care provider if:  You cough up more sputum than before and the sputum is yellow or green in color.  You have a fever.  You cannot control your cough and are losing sleep. Get help right away if:  You cough  up blood.  You have chest pain.  You have increasing shortness of breath.  You have pain that gets worse or is not controlled with medicines.  You have a fever and your symptoms suddenly get worse. Summary  Bronchiectasis is a condition in which the airways in the lungs (bronchi) are damaged and widened. The condition makes it hard for the lungs to get rid of mucus, and it causes mucus to gather in the bronchi.  Treatment usually includes therapy to help clear mucus from the lungs.  Stay up to date with vaccinations and immunizations. This information is not intended to replace advice given to you by your health care provider. Make sure you discuss any questions you have with your health care provider. Document Revised: 10/25/2017 Document Reviewed: 12/17/2016 Elsevier Patient Education  2020 [place].    Mycobacterium Avium Complex Mycobacterium avium complex (MAC) is an infection caused by two similar and very common types of bacteria. MAC causes two types of infection:  Local infection. This is limited  to one area. If you have a normal disease-fighting system (immune system), you are more likely to get this type of infection.  Disseminated infection. This is an infection that affects all parts of the body. It is more serious than a local infection. You are more likely to get this infection if you have a weak immune system. MAC infections most often affect the lungs. MAC is not contagious. This means that it does not spread from person to person. What are the causes? This condition is caused by two kinds of bacteria, Mycobacterium avium and Mycobacterium intracellulare. These bacteria are often found in drinking water, hot tubs, swimming pools, house dust, and animals. You may become infected from:  Breathing in water particles or dust particles that contain the bacteria (contaminated).  Eating or drinking something that is contaminated.  Drinking milk. MAC bacteria can  grow in pasteurized milk. What increases the risk? The following factors may make you more likely to develop this condition:  Having HIV or AIDS.  Being a child.  Being an older woman.  Smoking cigarettes.  Having long-term (chronic) lung diseases, such as: ? Tuberculosis. ? Chronic obstructive pulmonary disease (COPD). ? Lung cancer. ? Cystic fibrosis. What are the signs or symptoms? Symptoms vary depending on the type of MAC infection you have. Symptoms of a local infection  Lymph nodes that are bigger than usual (enlarged). Enlarged lymph nodes may be: ? On one side of the neck. ? Under the jaw. ? Around the ear.  Fatigue.  Shortness of breath.  A long-term cough that produces lots of mucus.  Abnormal sounds when breathing. A health care provider may hear this when listening to your lungs. Symptoms of a disseminated infection  Fever.  Night sweats.  Weight loss.  Loss of appetite.  Fatigue. How is this diagnosed? This condition may be diagnosed based on:  Your symptoms.  A physical exam.  Your medical history. You may also have tests, including:  Chest X-ray or CT scan.  Blood tests.  Test of mucus from your lungs (sputum) or other body fluids to see whether bacteria will grow (culture).  Removal of a piece of body tissue to be checked under a microscope (biopsy). How is this treated? Treatment for this condition depends on the type of infection. Treatment may include:  Taking antibiotic medicines. You may have to take antibiotics until MAC bacteria have stopped growing in cultures.  Surgery to remove infected lymph nodes. After the lymph nodes are removed, antibiotics are usually not needed. In some people with a disseminated infection, treatment with antibiotic medicines may continue for several years. Follow these instructions at home: Medicines   Take over-the-counter and prescription medicines only as told by your health care  provider.  Take your antibiotic medicine as told by your health care provider. Do not stop taking the antibiotic even if you start to feel better. Eating and drinking   Eat a healthy, well-balanced diet. Talk with your health care provider or a diet and nutrition specialist (dietitian) about what food choices are best for you.  Drink enough fluid to keep your urine pale yellow. General instructions  Seek medical care for any underlying health conditions you have. Follow your health care provider's instructions about how to manage those conditions.  Do not use any products that contain nicotine or tobacco, such as cigarettes and e-cigarettes. If you need help quitting, ask your health care provider.  Keep all follow-up visits as told by your health care provider.  This is important. How is this prevented?   Stay up-to-date on your vaccines.  Take all of your medicines as told by your health care provider, especially if you have problems with your immune system.  Avoid drinking water that may not be clean. [place], rivers, and other open water sources can contain germs that cause infections.  Wash your hands often with soap and water. If soap and water are not available, use hand sanitizer.  Practice safe food preparation, especially if your immune system is weak because of an underlying illness. ? Store foods at safe temperatures. ? Avoid eating raw or undercooked meats, poultry, fish, shellfish, and eggs. ? Wash and peel fruits and vegetables before eating or cooking them. ? Use separate food preparation surfaces and storage spaces for raw meat and for fruits and vegetables. ? Wash your hands, food preparation surfaces, and utensils thoroughly before and after you handle raw foods. Contact a health care provider if you:  Have chills or a fever.  Have a cough that does not go away.  Have loss of appetite or weight loss.  Feel very tired. Get help right away if you:  Have a  fever for more than 2-3 days.  Cough up blood.  Have trouble breathing.  Have chest pain. Summary  Mycobacterium avium complex (MAC) is an infection that is caused by two similar and very common types of bacteria.  If you have a normal disease-fighting system (immune system), you are more likely to get an infection that is limited to one area of your body (local infection).  If you have a weak immune system, you are likely to get infections in all parts of your body (disseminated infection).  Treatment for this condition is difficult because MAC does not respond to many common antibiotic medicines. Treatment depends on the type of infection, and may involve surgery or antibiotics. This information is not intended to replace advice given to you by your health care provider. Make sure you discuss any questions you have with your health care provider. Document Revised: 05/19/2018 Document Reviewed: 12/20/2017 Elsevier Patient Education  2020 [employer].

## 2020-09-12 NOTE — Assessment & Plan Note (Signed)
Plan: Start flutter valve Start hypertonic saline nebs 3 times daily Continue follow-up with infectious disease with sputum cultures Follow-up with our office in 2 months

## 2020-09-12 NOTE — Progress Notes (Signed)
@Patient  ID: , female    DOB: [birth year], 64 y.o.   MRN: [id number]  Chief Complaint  Patient presents with  . Follow-up    Patient has shortness of breath with exertion and feels tired/weak. Productive cough with clear sputum.     Referring provider: No ref. 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 76  09/12/2020  - Visit   64 year old female never smoker found our office for MAI. She is followed by Dr. 76. She was last seen in our office in October/2021. Plan of care from that office visit was as follows: Continue follow-up with infectious disease follow-up in 6 months. On 09/07/2020 is recommended that she obtain nebulizer and start hypertonic saline nebs 3 times daily. Start flutter valve. As well as walk in office to see if she will qualify for portable oxygen.  Patient was last seen by Dr. 09/09/2020 with infectious disease on 09/07/2020. The assessment and plan from that visit was as follows:  Plan: 1. Continue current 3 drug antibiotic regimen for now 2. Await results of susceptibility testing on 07/22/2020 Mycobacterium avium isolate 3. Repeat sputum AFB culture today 4. Check CBC and CMP 5. Reviewed potential benefit of supplemental oxygen, aerosolized hypertonic saline and flutter valve with her pulmonologist, Dr.Adewale Olalere 6. Call and discuss the case with Dr. 07/24/2020 7. Hold off on ophthalmology exam and audiogram until she has insurance coverage 8. Follow-up here in 3 weeks  Patient has not obtained a flutter valve yet also has not received her nebulizer or her hypertonic saline nebs prescription.  We will work on this today.  Patient is confused on how she is supposed to utilize these.  We will discuss this and review this today.  She  still is not obtained health insurance so is waiting for ophthalmology evaluation audiogram as indicated above.  She continues to report adherence to azithromycin, rifampin and ethambutol.  She still reports ongoing fatigue.  Patient was walked in office today completed 3 laps on room air without any oxygen desaturations.   Tests:   04/15/2020-CT chest high-res-very extensive clustered centrilobular nodularity throughout the lungs with areas of confluent nodularity and consolidation for example of the left lung base and lingula, there are areas of fibrotic bronchiectasis and large cavitary lesions in the upper lungs. Including a dominant cavitary lesion in the left pulmonary apex measuring at least 9 cm. Findings are in keeping with severe chronic atypical Mycobacterium infection. Aortic arthrosclerosis, prominent mediastinal lymph nodes   07/22/2020-Mycobacterium-Mycobacterium avium intracellular  06/09/2020-Mycobacterium-acid-fast bacillus, microtear nontuberculous, Mycobacterium avium intracellular group  FENO:  No results found for: NITRICOXIDE  PFT: No flowsheet data found.  WALK:  SIX MIN WALK 09/12/2020  Supplimental Oxygen during Test? (L/min) No  Tech Comments: Patient walked average pace and maintained good sats and completed all 3 laps    Imaging: No results found.  Lab Results:  CBC    Component Value Date/Time   WBC 8.3 09/07/2020 1410   RBC 4.91 09/07/2020 1410   HGB 15.1 09/07/2020 1410   HCT 44.2 09/07/2020 1410   PLT 413 (H) 09/07/2020 1410   MCV 90.0 09/07/2020 1410   MCH 30.8 09/07/2020 1410   MCHC 34.2 09/07/2020 1410   RDW 12.6 09/07/2020 1410   LYMPHSABS 1.9 08/08/2020 1844   MONOABS 0.7 08/08/2020 1844   EOSABS 0.3  08/08/2020 1844   BASOSABS 0.1 08/08/2020 1844    BMET    Component Value Date/Time   NA 133 (L) 09/07/2020 1410   K 4.2 09/07/2020 1410   CL 96 (L) 09/07/2020 1410   CO2 30 09/07/2020 1410   GLUCOSE 98 09/07/2020 1410   BUN 16  09/07/2020 1410   CREATININE 0.64 09/07/2020 1410   CALCIUM 9.7 09/07/2020 1410   GFRNONAA >60 08/08/2020 1844   GFRAA >60 08/08/2020 1844    BNP No results found for: BNP  ProBNP No results found for: PROBNP  Specialty Problems      Pulmonary Problems   Bronchiectasis without complication (HCC)      No Known Allergies  Immunization History  Administered Date(s) Administered  . Influenza,inj,Quad PF,6-35 Mos 08/27/2019  . Janssen (J&J) SARS-COV-2 Vaccination 02/20/2020    Past Medical History:  Diagnosis Date  . Hypertension     Tobacco History: Social History   Tobacco Use  Smoking Status Never Smoker  Smokeless Tobacco Never Used   Counseling given: Yes   Continue to not smoke  Outpatient Encounter Medications as of 09/12/2020  Medication Sig  . azithromycin (ZITHROMAX) 500 MG tablet Take 1 tablet (500 mg total) by mouth daily.  . bisoprolol-hydrochlorothiazide (ZIAC) 2.5-6.25 MG tablet Take 1 tablet by mouth daily.  Marland Kitchen ethambutol (MYAMBUTOL) 400 MG tablet Take 2 tablets (800 mg total) by mouth daily, THEN 2 tablets (800 mg total) daily.  . rifampin (RIFADIN) 300 MG capsule Take 2 capsules (600 mg total) by mouth daily.  . sodium chloride HYPERTONIC 3 % nebulizer solution 1 vial in neb three times per day  . triamcinolone (KENALOG) 0.025 % ointment Apply 1 application topically 2 (two) times daily.   No facility-administered encounter medications on file as of 09/12/2020.     Review of Systems  Review of Systems  Constitutional: Positive for fatigue (stable ). Negative for activity change and fever.  HENT: Positive for congestion. Negative for sinus pressure, sinus pain and sore throat.   Respiratory: Positive for cough. Negative for shortness of breath and wheezing.   Cardiovascular: Negative for chest pain and palpitations.  Gastrointestinal: Negative for diarrhea, nausea and vomiting.  Musculoskeletal: Negative for arthralgias.  Neurological:  Negative for dizziness.  Psychiatric/Behavioral: Negative for sleep disturbance. The patient is not nervous/anxious.      Physical Exam  BP 124/70 (BP Location: Left Arm, Patient Position: Sitting, Cuff Size: Normal)   Pulse 77   Temp (!) 97.2 F (36.2 C) (Temporal)   Ht 5\' 2"  (1.575 m)   Wt 113 lb (51.3 kg)   SpO2 96%   BMI 20.67 kg/m   Wt Readings from Last 5 Encounters:  09/12/20 113 lb (51.3 kg)  09/07/20 112 lb 8 oz (51 kg)  08/31/20 112 lb (50.8 kg)  07/19/20 113 lb (51.3 kg)  06/06/20 112 lb 3.2 oz (50.9 kg)    BMI Readings from Last 5 Encounters:  09/12/20 20.67 kg/m  09/07/20 20.58 kg/m  08/31/20 20.49 kg/m  07/19/20 21.35 kg/m  06/06/20 21.20 kg/m     Physical Exam Vitals and nursing note reviewed.  Constitutional:      General: She is not in acute distress.    Appearance: Normal appearance. She is normal weight.  HENT:     Head: Normocephalic and atraumatic.     Right Ear: External ear normal.     Left Ear: External ear normal.     Nose:     Comments: Deferred due to  masking requirement    Mouth/Throat:     Comments: Deferred due to masking requirement Eyes:     Pupils: Pupils are equal, round, and reactive to light.  Cardiovascular:     Rate and Rhythm: Normal rate and regular rhythm.     Pulses: Normal pulses.     Heart sounds: Normal heart sounds. No murmur heard.   Pulmonary:     Effort: Pulmonary effort is normal. No respiratory distress.     Breath sounds: No decreased air movement. No decreased breath sounds, wheezing or rales.     Comments: Scattered squeaks throughout exam, greater in right middle and right upper lobe Musculoskeletal:     Cervical back: Normal range of motion.  Skin:    General: Skin is warm and dry.     Capillary Refill: Capillary refill takes less than 2 seconds.  Neurological:     General: No focal deficit present.     Mental Status: She is alert and oriented to person, place, and time. Mental status is at  baseline.     Gait: Gait (Tolerated walk in office) normal.  Psychiatric:        Mood and Affect: Mood normal.        Behavior: Behavior normal.        Thought Content: Thought content normal.        Judgment: Judgment normal.       Assessment & Plan:   Bronchiectasis without complication (HCC) Plan: Start flutter valve Start hypertonic saline nebs 3 times daily Continue follow-up with infectious disease with sputum cultures Follow-up with our office in 2 months  Mycobacterium abscessus infection Plan: Continue to follow sputum culture results as managed by infectious disease Obtain ophthalmology and audiology evaluation once insurance is obtained Continue to work with infectious disease on status of Mycobacterium abscessus infection  Mycobacterium avium infection (HCC) Per infectious disease notes from October/2021 there is concerned about whether or not patient has been adherent with her 3 drug antibiotic regimen Initial isolate did not express any macrolide resistance but she is continue to grow Mycobacterium avium 4 months and antibiotic therapy Patient currently has pending sputum cultures from October/13/2021 that showed positive AFB for preliminary results She has not yet obtained hypertonic saline nebs She has not obtained a flutter valve Walk today in office patient did not qualify for oxygen Patient needs baseline eye exam as well as audiology evaluation when she obtains insurance  Plan: Continue ethambutol Continue azithromycin Continue rifampin Continue follow-up with infectious disease Start hypertonic saline nebs 3 times daily Obtain nebulizer Start flutter valve     Return in about 2 months (around 11/12/2020), or if symptoms worsen or fail to improve, for Follow up with Dr. Wynona Neat.   Coral Ceo, NP 09/12/2020   This appointment required 46 minutes of patient care (this includes precharting, chart review, review of results, face-to-face care,  etc.).

## 2020-09-12 NOTE — Assessment & Plan Note (Signed)
Plan: Continue to follow sputum culture results as managed by infectious disease Obtain ophthalmology and audiology evaluation once insurance is obtained Continue to work with infectious disease on status of Mycobacterium abscessus infection

## 2020-09-12 NOTE — Assessment & Plan Note (Signed)
Per infectious disease notes from October/2021 there is concerned about whether or not patient has been adherent with her 3 drug antibiotic regimen Initial isolate did not express any macrolide resistance but she is continue to grow Mycobacterium avium 4 months and antibiotic therapy Patient currently has pending sputum cultures from October/13/2021 that showed positive AFB for preliminary results She has not yet obtained hypertonic saline nebs She has not obtained a flutter valve Walk today in office patient did not qualify for oxygen Patient needs baseline eye exam as well as audiology evaluation when she obtains insurance  Plan: Continue ethambutol Continue azithromycin Continue rifampin Continue follow-up with infectious disease Start hypertonic saline nebs 3 times daily Obtain nebulizer Start flutter valve

## 2020-09-15 ENCOUNTER — Telehealth: Payer: Self-pay

## 2020-09-15 NOTE — Telephone Encounter (Signed)
Lab report as follows STATUS: PRELIMINARY P   SMEAR: Rare (1 +) acid-fast bacilli seen using the fluorochrome method.Abnormal P   ISOLATE 1: acid-fast bacillus Abnormal P   Comment: Acid-fast bacilli present in liquid culture media. Identification to follow.

## 2020-09-20 ENCOUNTER — Encounter: Payer: Self-pay | Admitting: Internal Medicine

## 2020-09-20 ENCOUNTER — Other Ambulatory Visit: Payer: Self-pay

## 2020-09-20 ENCOUNTER — Ambulatory Visit (INDEPENDENT_AMBULATORY_CARE_PROVIDER_SITE_OTHER): Payer: 59 | Admitting: Internal Medicine

## 2020-09-20 DIAGNOSIS — A319 Mycobacterial infection, unspecified: Secondary | ICD-10-CM | POA: Diagnosis not present

## 2020-09-20 DIAGNOSIS — A318 Other mycobacterial infections: Secondary | ICD-10-CM

## 2020-09-20 DIAGNOSIS — A310 Pulmonary mycobacterial infection: Secondary | ICD-10-CM

## 2020-09-20 NOTE — Progress Notes (Signed)
Patient requested Flu vaccine today. High does flu is currently on back order and is not available in the office today. Patient advised to contact pharmacy to schedule flu vaccine.  Valarie Cones

## 2020-09-20 NOTE — Assessment & Plan Note (Signed)
I will hold off on rearranging her antibiotic therapy to also include coverage for multidrug-resistant Mycobacterium abscessus pending further culture results and input from Dr. Illene Labrador.

## 2020-09-20 NOTE — Assessment & Plan Note (Signed)
Even though recent smears and cultures remain positive for Mycobacterium abscessus she is clinically improving on her current 3 drug regimen.  She will follow-up in 1 month.

## 2020-09-20 NOTE — Progress Notes (Signed)
[facility]  Patient Active Problem List   Diagnosis Date Noted  . 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 Bronchiectasis without complication ([employer]) 91/91/6606  . History of tuberculosis 04/21/2020  . Hypertension 04/21/2020  . Atherosclerotic peripheral vascular disease ([place]) 04/21/2020    Patient's Medications  New Prescriptions   No medications on file  Previous Medications   AZITHROMYCIN (ZITHROMAX) 500 MG TABLET    Take 1 tablet (500 mg total) by mouth daily.   BISOPROLOL-HYDROCHLOROTHIAZIDE (ZIAC) 2.5-6.25 MG TABLET    Take 1 tablet by mouth daily.   ETHAMBUTOL (MYAMBUTOL) 400 MG TABLET    Take 2 tablets (800 mg total) by mouth daily, THEN 2 tablets (800 mg total) daily.   RIFAMPIN (RIFADIN) 300 MG CAPSULE    Take 2 capsules (600 mg total) by mouth daily.   SODIUM CHLORIDE HYPERTONIC 3 % NEBULIZER SOLUTION    Take by nebulization every 8 (eight) hours.   TRIAMCINOLONE (KENALOG) 0.025 % OINTMENT    Apply 1 application topically 2 (two) times daily.  Modified Medications   No medications on file  Discontinued Medications   No medications on file    Subjective: Victoria Mendoza is in for her routine follow-up visit.  She started on azithromycin, ethambutol and rifampin on 04/21/2020 for Mycobacterium avium pneumonia.  She is not having any problems with nausea or vomiting.  She says her cough is about 90% improved.  She is only bringing up a very scant amount of sputum.  She has not had any improvement in her dyspnea on exertion.  She exercises regularly at home.  She says that overall she is better since starting antibiotics except for worsening fatigue.   She does not think that there is any way to obtain records of her TB isolate and treatment while she was living in [place] native [place], [place].  However she says that she does not recall being told  that there was any difficulty in treating and curing her infection.  She was not ever told that there was any resistance to her isolate.  Her recent sputum culture results are confusing.    03/17/2020 Mycobacterium avium was isolated and reported first.  Mycobacterium abscessus was isolated later and addended at the bottom of the report without a call to [place].   06/09/2020 Mycobacterium avium and Mycobacterium abscessus were isolated again. 07/22/2020 Mycobacterium avium was the only isolate (repeat susceptibilities are pending).  09/07/2020 AFB smear was 1+ positive.  A culture is pending   I recently received this correspondence back from Victoria Mendoza, from [facility] in [place], [place].  Dear Victoria Mendoza, The first step is to make sure this patient does not have TB thoroughly with 3 sputum cultures for AFB, 2 of which should have MTB PCR and preferably Xpert to determine if there is rifampin resistance. Recurrent and prior TB is a risk factor for having drug-resistant TB. I would also have her family in [place] obtain the records of her treatment and also susceptibility results on her TB organisms. 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 You need further sputum cultures (by induction if necessary) ASAP also for her NTM management. There are 2 ways forward - the first is to stop antibiotics until several other sputa have been obtained to try to identify a  dominate pathogen to target. The other is to continue your MAC treatment - ordinarily I would not recommend this because 1) your treatment may mask growth once started making it difficult to tell if you are dealing with MAC or Abscessus (or both), and also the risk of inducing M. abscessus macrolide resistance with an incomplete regimen. In her case, you may wish to continue MAC therapy because, 1) she seems to have extensive disease and delaying could worsen this, and 2) her abscessus seems to be macrolide  resistant already. With regard to the latter you also should make efforts to identify the presence of a functional erm gene for inducible macrolide resistance (either by direct molecular detection or 14-day incubation of the macrolide - you should request that one or both be done on the specimen you have now and also others going forward). This is because the ability to use macrolide substantially increases antibiotic effectiveness. We do have a way to treat both abscessus and MAC if there is ongoing growth of both but it is a complicated regimen - you may contact me again in that scenario. She should also be started on regular twice daily use of flutter valve and hypertonic saline (with teaching by respiratory therapy) which is the management of bronchiectasis regardless of NT M - with her cavities a vest is also indicated. Clearance should be done before eating/drinking or 3 hours after to avoid reflux and aspiration. You should obtain baseline audiogram and testing of visual acuity and color vision at treatment start and then vision testing every 3 months on ethambutol. She also has indication for IV amikacin (for both MAC and M abscessus) with her cavitary lesion - usually dosed at 2m/kg MWF with adjustment by drug levels. She should have monthly audiograms on IV amikacin. It is important to confirm your amikacin MIC for the MAC with subsequent cultures as 32 is intermediate (we do not use amikacin IV at MIC 64 but can still do inhaled). Cure is unlikely without surgical resection of the cavity which should be contemplated assessment takes into account the location/focality of cavitary or severe bronchiectasis, pulmonary and cardiac function, amongst other factors. IV amikacin is most important 6-8 weeks before and 6-8 weeks after surgery (i.e., it is sometimes not started until a surgical date is made since risk of hearing loss increases with cumulative use). If she continues to grow abscessus contact me for  treatment recommendations. I can be reached at [email or web address] or [phone] Victoria Mendoza I emailed Dr. EMinerva Fester1 week ago asking for more guidance about treatment but have not heard back from him yet.  She was recently evaluated by pulmonary and started on aerosolized saline with a flutter valve.  She was also evaluated for the possible need for supplemental oxygen but was found that she did not.  She is feeling better.  She is not as fatigued and notes that her cough is better.  She is not bringing up as much sputum.  She is not having any problems tolerating her antibiotics    Review of Systems: Review of Systems  Constitutional: Negative for chills, diaphoresis, fever and weight loss.  Respiratory: Positive for cough, sputum production and shortness of breath. Negative for hemoptysis and wheezing.   Cardiovascular: Negative for chest pain.  Gastrointestinal: Negative for abdominal pain, diarrhea, nausea and vomiting.  Psychiatric/Behavioral: Negative for depression.    Past Medical History:  Diagnosis Date  . Hypertension     Social History   Tobacco  Use  . Smoking status: Never Smoker  . Smokeless tobacco: Never Used  Vaping Use  . Vaping Use: Never used  Substance Use Topics  . Alcohol use: Never  . Drug use: Never    Family History  Problem Relation Age of Onset  . Cancer Mother   . Heart attack Father     No Known Allergies  Objective: Vitals:   09/20/20 1451  BP: 132/79  Pulse: 85  Temp: 98 F (36.7 C)  SpO2: 96%  Weight: 112 lb (50.8 kg)  Height: _0  (1.575 m)   Body mass index is 20.49 kg/m.  Physical Exam Constitutional:      Comments: She is in good spirits as usual.  Her weight is unchanged.  Cardiovascular:     Rate and Rhythm: Normal rate and regular rhythm.     Heart sounds: No murmur heard.   Pulmonary:     Effort: Pulmonary effort is normal.     Breath sounds: Normal breath sounds. No wheezing, rhonchi or rales.      Comments: Her room air oxygen saturation was 93% at rest and dropped to 88% with a gentle walk around the clinic. Abdominal:     Palpations: Abdomen is soft.     Tenderness: There is no abdominal tenderness.  Psychiatric:        Mood and Affect: Mood normal.       Problem List Items Addressed This Visit      High   Mycobacterium avium infection ([place])    Even though recent smears and cultures remain positive for Mycobacterium abscessus she is clinically improving on her current 3 drug regimen.  She will follow-up in 1 month.      Mycobacterium abscessus infection    I will hold off on rearranging her antibiotic therapy to also include coverage for multidrug-resistant Mycobacterium abscessus pending further culture results and input from Victoria Mendoza.        Plan: 1. Continue current 3 drug antibiotic regimen for now 2. Await results of susceptibility testing on 07/22/2020 Mycobacterium avium isolate 3. Repeat sputum AFB culture today 4. Check CBC and CMP 5. Reviewed potential benefit of supplemental oxygen, aerosolized hypertonic saline and flutter valve with her pulmonologist, Victoria Mendoza 6. Call and discuss the case with Victoria Mendoza 7. Hold off on ophthalmology exam and audiogram until she has insurance coverage 8. Follow-up here in 3 weeks     Victoria Bickers, MD [facility] [phone] pager   [phone] cell 09/20/2020, 3:07 PM

## 2020-09-27 ENCOUNTER — Telehealth: Payer: Self-pay

## 2020-09-27 NOTE — Telephone Encounter (Signed)
Call from [facility] regarding  MYCOBACTERIA, CULTURE, WITH FLUOROCHROME SMEAR  STATUS: PRELIMINARY P  SMEAR: Rare (1 +) acid-fast bacilli seen using the fluorochrome method.Abnormal P  ISOLATE 1: mycobacterium, non-tuberculosis Abnormal P  Comment: Mycobacterium species, not M.tuberculosis complex Isolate forwarded to [facility] for Identification. DNA probe result negative for M. avium complex.  Valarie Cones

## 2020-09-28 ENCOUNTER — Telehealth: Payer: Self-pay | Admitting: Internal Medicine

## 2020-09-28 NOTE — Telephone Encounter (Signed)
I called and spoke with Dr. Sigurd Sos, at [facility] in [place], today.  He had provided an E consult for me about 1 month ago.  He agreed that Victoria Mendoza's case was quite complex.  He said that when two nontuberculous mycobacteria grow from sputum, one of the isolates is usually more important.  Given that Mycobacterium abscesses grew relatively late on the initial culture it may be there in lower quantities.  This is also supported by the fact that the repeat sputum culture in August did not yield Mycobacterium abscessus.  He feels it is reasonable to continue her current 3 drug regimen focusing on Mycobacterium avium.  He also emphasized the importance of airway clearance.  She is currently getting some improvement since starting aerosolized saline and use of a flutter valve.  He said that in patients with reduced FEV1 they use the Valera machine, sometimes in combination with a vibratory vest.  If she worsens and continues to isolate both organisms he would like to have her on at least 3 active drugs for each isolate.  If I decide to treat both isolates he suggested the following regimen:  Azithromycin (targeting M avium) Ethambutol (targeting M avium) Clofazimine (targeting both) IV amikacin (targeting M abscessus) and transitioning to aerosolized amikacin after several months IV imipenem (targeting M abscessus) and transitioning to oral omadacycline after several months  He emphasized the need for monthly sputum cultures, audiograms and frequent eye exams.  He offered further phone consultation in the future.  I called and reviewed this plan with [name].

## 2020-10-11 MED FILL — AZITHROMYCIN 500 MG TABS: 500 | 30 days supply | Qty: 30 | Fill #6

## 2020-10-11 MED FILL — ETHAMBUTOL HCL 400 MG TAB: 400 | 30 days supply | Qty: 60 | Fill #2

## 2020-10-11 MED FILL — rifAMPin 300 MG CAPS: 300 | 30 days supply | Qty: 60 | Fill #6

## 2020-10-17 ENCOUNTER — Telehealth: Payer: Self-pay | Admitting: *Deleted

## 2020-10-17 NOTE — Telephone Encounter (Signed)
[facility] calling regarding preliminary positive sputum culture.  Patient on treatment, following with Dr Orvan Falconer this month. Andree Coss, RN

## 2020-10-18 ENCOUNTER — Ambulatory Visit (INDEPENDENT_AMBULATORY_CARE_PROVIDER_SITE_OTHER): Payer: 59 | Admitting: Internal Medicine

## 2020-10-18 ENCOUNTER — Other Ambulatory Visit: Payer: Self-pay

## 2020-10-18 DIAGNOSIS — A318 Other mycobacterial infections: Secondary | ICD-10-CM

## 2020-10-18 DIAGNOSIS — A319 Mycobacterial infection, unspecified: Secondary | ICD-10-CM

## 2020-10-18 NOTE — Progress Notes (Addendum)
[facility]  Patient Active Problem List   Diagnosis Date Noted  . 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 Bronchiectasis without complication ([place]) [id number]/[id number]  . History of tuberculosis 04/21/2020  . Hypertension 04/21/2020  . Atherosclerotic peripheral vascular disease ([place]) 04/21/2020    Patient's Medications  New Prescriptions   No medications on file  Previous Medications   AZITHROMYCIN (ZITHROMAX) 500 MG TABLET    Take 1 tablet (500 mg total) by mouth daily.   BISOPROLOL-HYDROCHLOROTHIAZIDE (ZIAC) 2.5-6.25 MG TABLET    Take 1 tablet by mouth daily.   ETHAMBUTOL (MYAMBUTOL) 400 MG TABLET    Take 2 tablets (800 mg total) by mouth daily, THEN 2 tablets (800 mg total) daily.   RIFAMPIN (RIFADIN) 300 MG CAPSULE    Take 2 capsules (600 mg total) by mouth daily.   SODIUM CHLORIDE HYPERTONIC 3 % NEBULIZER SOLUTION    Take by nebulization every 8 (eight) hours.   TRIAMCINOLONE (KENALOG) 0.025 % OINTMENT    Apply 1 application topically 2 (two) times daily.  Modified Medications   No medications on file  Discontinued Medications   No medications on file    Subjective: Victoria Mendoza is in for her routine follow-up visit.  She started on azithromycin, ethambutol and rifampin on 04/21/2020 for Mycobacterium avium pneumonia. I was not notified of the late report of coinfection with Mycobacterium abscessus until she was 2 months into therapy.  She also has a history of treatment for active TB pneumonia several years ago while living in [place] native [place], [place].  She does not recall being told that there was any difficulty in treating and curing her infection.  She was not ever told that there was any resistance to her isolate.  Her recent sputum culture results are confusing.    03/17/2020 Mycobacterium avium was isolated and reported first.  Mycobacterium  abscessus was isolated later and addended at the bottom of the report without a call to [place].   06/09/2020 Mycobacterium avium and Mycobacterium abscessus were isolated again. 07/22/2020 Mycobacterium avium was the only isolate.  09/07/2020 Mycobacterium abscessus (repeat susceptibilities are pending).   I recently received this correspondence back from Dr. Thayer Jew, from [facility] in [place], [place].  Dear Dr. Megan Salon, The first step is to make sure this patient does not have TB thoroughly with 3 sputum cultures for AFB, 2 of which should have MTB PCR and preferably Xpert to determine if there is rifampin resistance. Recurrent and prior TB is a risk factor for having drug-resistant TB. I would also have her family in [place] obtain the records of her treatment and also susceptibility results on her TB organisms. 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 You need further sputum cultures (by induction if necessary) ASAP also for her NTM management. There are 2 ways forward - the first is to stop antibiotics until several other sputa have been obtained to try to identify a dominate pathogen to target. The other is to continue your MAC treatment - ordinarily I would not recommend this because 1) your treatment may mask growth once started making it difficult to tell if you are dealing with MAC or Abscessus (or both), and also the risk of inducing M. abscessus macrolide resistance with an incomplete regimen. In her case, you may wish to  continue MAC therapy because, 1) she seems to have extensive disease and delaying could worsen this, and 2) her abscessus seems to be macrolide resistant already. With regard to the latter you also should make efforts to identify the presence of a functional erm gene for inducible macrolide resistance (either by direct molecular detection or 14-day incubation of the macrolide - you should request that one or both be done on the specimen you  have now and also others going forward). This is because the ability to use macrolide substantially increases antibiotic effectiveness. We do have a way to treat both abscessus and MAC if there is ongoing growth of both but it is a complicated regimen - you may contact me again in that scenario. She should also be started on regular twice daily use of flutter valve and hypertonic saline (with teaching by respiratory therapy) which is the management of bronchiectasis regardless of NT M - with her cavities a vest is also indicated. Clearance should be done before eating/drinking or 3 hours after to avoid reflux and aspiration. You should obtain baseline audiogram and testing of visual acuity and color vision at treatment start and then vision testing every 3 months on ethambutol. She also has indication for IV amikacin (for both MAC and M abscessus) with her cavitary lesion - usually dosed at 77m/kg MWF with adjustment by drug levels. She should have monthly audiograms on IV amikacin. It is important to confirm your amikacin MIC for the MAC with subsequent cultures as 32 is intermediate (we do not use amikacin IV at MIC 64 but can still do inhaled). Cure is unlikely without surgical resection of the cavity which should be contemplated assessment takes into account the location/focality of cavitary or severe bronchiectasis, pulmonary and cardiac function, amongst other factors. IV amikacin is most important 6-8 weeks before and 6-8 weeks after surgery (i.e., it is sometimes not started until a surgical date is made since risk of hearing loss increases with cumulative use). If she continues to grow abscessus contact me for treatment recommendations. I can be reached at [email or web address] or [phone] Dr. JThayer Jew          [facility]  He agreed that Victoria Mendoza's case was quite complex.  He said that when two  nontuberculous mycobacteria grow from sputum, one of the isolates is usually more important. Given that Mycobacterium abscesses grew relatively late on the initial culture it may be there in lower quantities.  This is also supported by the fact that the repeat sputum culture in August did not yield Mycobacterium abscessus.  He feels it is reasonable to continue her current 3 drug regimen focusing on Mycobacterium avium.  He also emphasized the importance of airway clearance.  She is currently getting some improvement since starting aerosolized saline and use of a flutter valve.  He said that in patients with reduced FEV1 they use the Valera machine, sometimes in combination with a vibratory vest.  If she worsens and continues to isolate both organisms he would like to have her on at least 3 active drugs for each isolate.  If I decide to treat both isolates he suggested the following regimen:  Azithromycin (targeting M avium) Ethambutol (targeting M avium) Clofazimine (targeting both) IV amikacin (targeting M abscessus) and transitioning to aerosolized amikacin after several months IV imipenem (targeting M abscessus) and transitioning to oral omadacycline after several months  He emphasized  the need for monthly sputum cultures, audiograms and frequent eye exams.  He offered further phone consultation in the future.        She was recently evaluated by pulmonary and started on aerosolized saline with a flutter valve.  She was also evaluated for the possible need for supplemental oxygen but was found that she did not.  She does not feel like the aerosolized saline is helping very much and is not using it on a regular basis.  She does use the flutter valve.  She is feeling much weaker.  She is still walking 1 to 2 miles each day on a treadmill but says that she must pace herself to manage her dyspnea.  She is coughing more and bringing up more clear sputum.  She has not had any hemoptysis, fever,  chills or sweats.  She has tolerating her current antibiotics.   I called Dr. Minerva Fester on 09/28/2020. He agreed that Victoria Mendoza's case was quite complex. He said that went to nontuberculous mycobacteria grow from sputum, one of the isolate is usually more important. At the time that I spoke with him the latest culture result was not back yet. He recommended continuing therapy targeting Mycobacterium avium since Mycobacterium abscessus was not isolated on the culture in August.. He also emphasized the importance of airway clearance. She recently started using aerosolized saline and a flutter valve.  He suggested that if she worsens over continues to grow Mycobacterium abscessus, ideally, she would be receiving at least 3 active drugs for each isolate. He suggested the following regimen.  Azithromycin (targeting Mycobacterium avium) Ethambutol (targeting Mycobacterium avium) Clofazimine (targeting both) IV amikacin (targeting Mycobacterium abscessus) and transitioning to aerosolized amikacin after several months The imipenem (targeting Mycobacterium abscessus) and transitioning to oral omadacycline after several months  He emphasized the need for monthly sputum cultures, audiograms and frequent eye exams.  She is feeling worse today. She is having more problems with fatigue, cough productive of large amounts of white phlegm and shortness of breath.   Review of Systems: Review of Systems  Constitutional: Negative for chills, diaphoresis, fever and weight loss.  Respiratory: Positive for cough, sputum production and shortness of breath. Negative for hemoptysis and wheezing.   Cardiovascular: Negative for chest pain.  Gastrointestinal: Negative for abdominal pain, diarrhea, nausea and vomiting.  Psychiatric/Behavioral: Negative for depression.    Past Medical History:  Diagnosis Date  . Hypertension     Social History   Tobacco Use  . Smoking status: Never Smoker  . Smokeless tobacco: Never Used   Vaping Use  . Vaping Use: Never used  Substance Use Topics  . Alcohol use: Never  . Drug use: Never    Family History  Problem Relation Age of Onset  . Cancer Mother   . Heart attack Father     No Known Allergies  Objective: Vitals:   10/18/20 1439  BP: 140/80  Pulse: 82  Temp: (!) 97.5 F (36.4 C)  TempSrc: Oral  SpO2: 95%  Weight: 114 lb (51.7 kg)   Body mass index is 20.85 kg/m.  Physical Exam Constitutional:      Comments: Her weight is unchanged.  Cardiovascular:     Rate and Rhythm: Normal rate and regular rhythm.     Heart sounds: No murmur heard.   Pulmonary:     Effort: Pulmonary effort is normal.     Breath sounds: Normal breath sounds. No wheezing, rhonchi or rales.  Abdominal:     Palpations: Abdomen is soft.  Tenderness: There is no abdominal tenderness.  Psychiatric:        Mood and Affect: Mood normal.    Sputum culture 09/07/2020 Growing Mycobacterium abscessus again with susceptibilities pending   Problem List Items Addressed This Visit      High   Mycobacterium abscessus infection    Unfortunately her most recent sputum culture has grown Mycobacterium abscessus again.  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  I reiterated that her infection is not curable and that the best we can hope for is to simply try to moderate her symptoms.  She has new health insurance but says that it does not cover eye exams or audiograms.  She says that she does not believe that she can tolerate a combination of oral and IV antibiotic therapy to treat both organisms.  I talked to her about the possibility of a palliative care consult.  She asked whether or not she would be a candidate for lung transplant.  She admits that she is having a great deal of difficulty making decisions.  She wants to know if there is anyone else that she could get an opinion from. I will try to  arrange evaluation by Dr. Brigitte Pulse at [facility].      Relevant Orders   Ambulatory referral to Infectious Disease     Plan: 1. Continue current 3 drug antibiotic regimen for now 2. Await results of susceptibility testing on 07/22/2020 Mycobacterium abscessus isolate 3. Referral for second opinion from Dr. Brigitte Pulse at [facility] 4. Follow-up here in 1 month   Michel Bickers, MD [facility] for [place] [phone] pager   [phone] cell 10/19/2020, 9:08 AM

## 2020-10-18 NOTE — Assessment & Plan Note (Addendum)
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  I reiterated that her infection is not curable and that the best we can hope for is to simply try to moderate her symptoms.  She has new health insurance but says that it does not cover eye exams or audiograms.  She says that she does not believe that she can tolerate a combination of oral and IV antibiotic therapy to treat both organisms.  I talked to her about the possibility of a palliative care consult.  She asked whether or not she would be a candidate for lung transplant.  She admits that she is having a great deal of difficulty making decisions.  She wants to know if there is anyone else that she could get an opinion from. I will try to arrange evaluation by Dr. Zackery Barefoot at [facility].

## 2020-10-22 LAB — MYCOBACTERIA,CULT W/FLUOROCHROME SMEAR
MICRO NUMBER:: 10882706
SMEAR:: NONE SEEN
SPECIMEN QUALITY:: ADEQUATE

## 2020-10-22 LAB — M. AVIUM MIC PANEL
AMIKACIN: 32
CIPROFLOXACIN: >16
CLARITHROMYCIN: 2
ETHAMBUTOL: 8
ETHIONAMIDE: 20
ISONIAZID: 8
LINEZOLID: 32
MOXIFLOXACIN: 4
RIFABUTIN: <=0.25
RIFAMPIN: 4
STREPTOMYCIN: >64

## 2020-10-24 LAB — MYCOBACTERIA,CULT W/FLUOROCHROME SMEAR

## 2020-10-24 LAB — TIQ-NTM

## 2020-11-05 LAB — MYCOBACTERIA,CULT W/FLUOROCHROME SMEAR
MICRO NUMBER:: 11069138
SPECIMEN QUALITY:: ADEQUATE

## 2020-11-05 LAB — SUSCEPT, AFB RAPID
Amikacin: 16 ug/mL
Ceoxitin: 64 ug/mL
Ciprofloxacin: >4 ug/mL
Clarithromycin: >16 ug/mL
Doxycycline: >16 ug/mL
Imipenem: 16 ug/mL
Linezolid: 32 ug/mL
Minocycline: >8 ug/mL
Moxifloxxacin: >8 ug/mL
Tigecycline: 1 ug/mL

## 2020-11-05 LAB — TEST AUTHORIZATION

## 2020-11-05 LAB — CP MYCOBACTERIAL IDENTIFICATION

## 2020-11-14 MED FILL — ETHAMBUTOL HCL 400 MG TAB: 400 | 30 days supply | Qty: 60 | Fill #3

## 2020-11-14 MED FILL — rifAMPin 300 MG CAPS: 300 | 30 days supply | Qty: 60 | Fill #7

## 2020-11-14 MED FILL — AZITHROMYCIN 500 MG TABS: 500 | 30 days supply | Qty: 30 | Fill #7

## 2020-11-15 ENCOUNTER — Other Ambulatory Visit: Payer: Self-pay

## 2020-11-15 ENCOUNTER — Ambulatory Visit (INDEPENDENT_AMBULATORY_CARE_PROVIDER_SITE_OTHER): Payer: 59 | Admitting: Internal Medicine

## 2020-11-15 ENCOUNTER — Encounter: Payer: Self-pay | Admitting: Internal Medicine

## 2020-11-15 DIAGNOSIS — A310 Pulmonary mycobacterial infection: Secondary | ICD-10-CM

## 2020-11-15 NOTE — Assessment & Plan Note (Signed)
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  She is not willing to consider proceeding with a more complicated regimen at this time.  She would like to meet with Dr. Valentina Lucks at [facility] before making any other decisions.

## 2020-11-15 NOTE — Progress Notes (Signed)
[facility] for Infectious Disease  Patient Active Problem List   Diagnosis Date Noted  . 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 Bronchiectasis without complication ([place]) 16/08/9603  . History of tuberculosis 04/21/2020  . Hypertension 04/21/2020  . Atherosclerotic peripheral vascular disease ([place]) 04/21/2020    Patient's Medications  New Prescriptions   No medications on file  Previous Medications   AZITHROMYCIN (ZITHROMAX) 500 MG TABLET    Take 1 tablet (500 mg total) by mouth daily.   BISOPROLOL-HYDROCHLOROTHIAZIDE (ZIAC) 2.5-6.25 MG TABLET    Take 1 tablet by mouth daily.   ETHAMBUTOL (MYAMBUTOL) 400 MG TABLET    Take 2 tablets (800 mg total) by mouth daily, THEN 2 tablets (800 mg total) daily.   RIFAMPIN (RIFADIN) 300 MG CAPSULE    Take 2 capsules (600 mg total) by mouth daily.   SODIUM CHLORIDE HYPERTONIC 3 % NEBULIZER SOLUTION    Take by nebulization every 8 (eight) hours.   TRIAMCINOLONE (KENALOG) 0.025 % OINTMENT    Apply 1 application topically 2 (two) times daily.  Modified Medications   No medications on file  Discontinued Medications   No medications on file    Subjective: Loura is in for her routine follow-up visit.  She started on azithromycin, ethambutol and rifampin on 04/21/2020 for Mycobacterium avium pneumonia. I was not notified of the late report of coinfection with Mycobacterium abscessus until she was 2 months into therapy.  She also has a history of treatment for active TB pneumonia several years ago while living in her native [place], [place].  She does not recall being told that there was any difficulty in treating and curing her infection.  She was not ever told that there was any resistance to her isolate.  Her recent sputum culture results are confusing.    03/17/2020 Mycobacterium avium was isolated and reported first.  Mycobacterium  abscessus was isolated later and addended at the bottom of the report without a call to [place].   06/09/2020 Mycobacterium avium and Mycobacterium abscessus were isolated again. 07/22/2020 Mycobacterium avium was the only isolate.  09/07/2020 Mycobacterium abscessus (repeat susceptibilities are pending).   I recently received this correspondence back from Dr. Thayer Jew, from [facility] in [place], [place].  Dear Dr. Megan Salon, The first step is to make sure this patient does not have TB thoroughly with 3 sputum cultures for AFB, 2 of which should have MTB PCR and preferably Xpert to determine if there is rifampin resistance. Recurrent and prior TB is a risk factor for having drug-resistant TB. I would also have her family in [place] obtain the records of her treatment and also susceptibility results on her TB organisms. 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 You need further sputum cultures (by induction if necessary) ASAP also for her NTM management. There are 2 ways forward - the first is to stop antibiotics until several other sputa have been obtained to try to identify a dominate pathogen to target. The other is to continue your MAC treatment - ordinarily I would not recommend this because 1) your treatment may mask growth once started making it difficult to tell if you are dealing with MAC or Abscessus (or both), and also the risk of inducing M. abscessus macrolide resistance with an incomplete regimen. In her case, you may wish to  continue MAC therapy because, 1) she seems to have extensive disease and delaying could worsen this, and 2) her abscessus seems to be macrolide resistant already. With regard to the latter you also should make efforts to identify the presence of a functional erm gene for inducible macrolide resistance (either by direct molecular detection or 14-day incubation of the macrolide - you should request that one or both be done on the specimen you  have now and also others going forward). This is because the ability to use macrolide substantially increases antibiotic effectiveness. We do have a way to treat both abscessus and MAC if there is ongoing growth of both but it is a complicated regimen - you may contact me again in that scenario. She should also be started on regular twice daily use of flutter valve and hypertonic saline (with teaching by respiratory therapy) which is the management of bronchiectasis regardless of NT M - with her cavities a vest is also indicated. Clearance should be done before eating/drinking or 3 hours after to avoid reflux and aspiration. You should obtain baseline audiogram and testing of visual acuity and color vision at treatment start and then vision testing every 3 months on ethambutol. She also has indication for IV amikacin (for both MAC and M abscessus) with her cavitary lesion - usually dosed at 99m/kg MWF with adjustment by drug levels. She should have monthly audiograms on IV amikacin. It is important to confirm your amikacin MIC for the MAC with subsequent cultures as 32 is intermediate (we do not use amikacin IV at MIC 64 but can still do inhaled). Cure is unlikely without surgical resection of the cavity which should be contemplated assessment takes into account the location/focality of cavitary or severe bronchiectasis, pulmonary and cardiac function, amongst other factors. IV amikacin is most important 6-8 weeks before and 6-8 weeks after surgery (i.e., it is sometimes not started until a surgical date is made since risk of hearing loss increases with cumulative use). If she continues to grow abscessus contact me for treatment recommendations. I can be reached at [email or web address] or [phone] Dr. JThayer Jew          [facility] [place]  He agreed that Mykelle's case was quite complex.  He said that when two  nontuberculous mycobacteria grow from sputum, one of the isolates is usually more important. Given that Mycobacterium abscesses grew relatively late on the initial culture it may be there in lower quantities.  This is also supported by the fact that the repeat sputum culture in August did not yield Mycobacterium abscessus.  He feels it is reasonable to continue her current 3 drug regimen focusing on Mycobacterium avium.  He also emphasized the importance of airway clearance.  She is currently getting some improvement since starting aerosolized saline and use of a flutter valve.  He said that in patients with reduced FEV1 they use the Valera machine, sometimes in combination with a vibratory vest.  If she worsens and continues to isolate both organisms he would like to have her on at least 3 active drugs for each isolate.  If I decide to treat both isolates he suggested the following regimen:  Azithromycin (targeting M avium) Ethambutol (targeting M avium) Clofazimine (targeting both) IV amikacin (targeting M abscessus) and transitioning to aerosolized amikacin after several months IV imipenem (targeting M abscessus) and transitioning to oral omadacycline after several months  He emphasized  the need for monthly sputum cultures, audiograms and frequent eye exams.  He offered further phone consultation in the future.        She was recently evaluated by pulmonary and started on aerosolized saline with a flutter valve.  She was also evaluated for the possible need for supplemental oxygen but was found that she did not.  She does not feel like the aerosolized saline is helping very much and is not using it on a regular basis.  She does use the flutter valve.  She is feeling much weaker.  She is still walking 1 to 2 miles each day on a treadmill but says that she must pace herself to manage her dyspnea.  She is coughing more and bringing up more clear sputum.  She has not had any hemoptysis, fever,  chills or sweats.  She has tolerating her current antibiotics.   I called Dr. Minerva Fester on 09/28/2020. He agreed that Keirston's case was quite complex. He said that went to nontuberculous mycobacteria grow from sputum, one of the isolate is usually more important. At the time that I spoke with him the latest culture result was not back yet. He recommended continuing therapy targeting Mycobacterium avium since Mycobacterium abscessus was not isolated on the culture in August.. He also emphasized the importance of airway clearance. She recently started using aerosolized saline and a flutter valve.  He suggested that if she worsens over continues to grow Mycobacterium abscessus, ideally, she would be receiving at least 3 active drugs for each isolate. He suggested the following regimen.  Azithromycin (targeting Mycobacterium avium) Ethambutol (targeting Mycobacterium avium) Clofazimine (targeting both) IV amikacin (targeting Mycobacterium abscessus) and transitioning to aerosolized amikacin after several months The imipenem (targeting Mycobacterium abscessus) and transitioning to oral omadacycline after several months  He emphasized the need for monthly sputum cultures, audiograms and frequent eye exams.  At the time of her last visit 1 month ago she was slightly overwhelmed and is not interested in proceeding with a more complicated antibiotic regimen.  She tells me now that she has not thought about it anymore.  She feels like she needs a lung transplant.  She has a visit scheduled with Dr. Brigitte Pulse at [facility] early next year.  She says that she is actually feeling a little bit better recently with decreased cough and decreased shortness of breath.  She is walking about 2 miles daily.   Review of Systems: Review of Systems  Constitutional: Negative for chills, diaphoresis, fever and weight loss.  Respiratory: Positive for cough, sputum production and shortness of breath. Negative for hemoptysis and  wheezing.   Cardiovascular: Negative for chest pain.  Gastrointestinal: Negative for abdominal pain, diarrhea, nausea and vomiting.  Psychiatric/Behavioral: Negative for depression.    Past Medical History:  Diagnosis Date  . Hypertension     Social History   Tobacco Use  . Smoking status: Never Smoker  . Smokeless tobacco: Never Used  Vaping Use  . Vaping Use: Never used  Substance Use Topics  . Alcohol use: Never  . Drug use: Never    Family History  Problem Relation Age of Onset  . Cancer Mother   . Heart attack Father     No Known Allergies  Objective: Vitals:   11/15/20 1406  BP: (!) 156/90  Pulse: 80  Temp: 97.8 F (36.6 C)  TempSrc: Oral  Weight: 116 lb (52.6 kg)   Body mass index is 21.22 kg/m.  Physical Exam Constitutional:  Comments: Her weight is unchanged.  Cardiovascular:     Rate and Rhythm: Normal rate and regular rhythm.     Heart sounds: No murmur heard.   Pulmonary:     Effort: Pulmonary effort is normal.     Breath sounds: Normal breath sounds. No wheezing, rhonchi or rales.  Abdominal:     Palpations: Abdomen is soft.     Tenderness: There is no abdominal tenderness.  Psychiatric:        Mood and Affect: Mood normal.    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  She is not willing to consider proceeding with a more complicated regimen at this time.  She would like to meet with Dr. Lorenda Cahill at [facility] before making any other decisions.      Relevant Orders   CBC   Comprehensive metabolic panel   MYCOBACTERIA, CULTURE, WITH FLUOROCHROME SMEAR      Plan: 1. Continue current 3 drug antibiotic regimen for now 2. Await results of susceptibility testing on 07/22/2020 Mycobacterium abscessus isolate 3. Referral for second opinion from Dr. Brigitte Pulse at [facility] 4. Follow-up here in 1 month   Michel Bickers, MD [facility] for [place] [phone] pager   [phone] cell 11/15/2020, 2:22 PM

## 2020-11-16 LAB — CBC
HCT: 43.4 % (ref 35.0–45.0)
Hemoglobin: 15.1 g/dL (ref 11.7–15.5)
MCH: 31.9 pg (ref 27.0–33.0)
MCHC: 34.8 g/dL (ref 32.0–36.0)
MCV: 91.6 fL (ref 80.0–100.0)
MPV: 9.5 fL (ref 7.5–12.5)
Platelets: 412 10*3/uL — ABNORMAL HIGH (ref 140–400)
RBC: 4.74 10*6/uL (ref 3.80–5.10)
RDW: 12.3 % (ref 11.0–15.0)
WBC: 8.6 10*3/uL (ref 3.8–10.8)

## 2020-11-16 LAB — COMPREHENSIVE METABOLIC PANEL
AG Ratio: 1.3 (calc) (ref 1.0–2.5)
ALT: 25 U/L (ref 6–29)
AST: 24 U/L (ref 10–35)
Albumin: 4.4 g/dL (ref 3.6–5.1)
Alkaline phosphatase (APISO): 82 U/L (ref 37–153)
BUN: 11 mg/dL (ref 7–25)
CO2: 31 mmol/L (ref 20–32)
Calcium: 10 mg/dL (ref 8.6–10.4)
Chloride: 91 mmol/L — ABNORMAL LOW (ref 98–110)
Creat: 0.69 mg/dL (ref 0.50–0.99)
Globulin: 3.3 g/dL (calc) (ref 1.9–3.7)
Glucose, Bld: 76 mg/dL (ref 65–99)
Potassium: 4.5 mmol/L (ref 3.5–5.3)
Sodium: 131 mmol/L — ABNORMAL LOW (ref 135–146)
Total Bilirubin: 0.7 mg/dL (ref 0.2–1.2)
Total Protein: 7.7 g/dL (ref 6.1–8.1)

## 2020-12-26 ENCOUNTER — Telehealth: Payer: Self-pay

## 2020-12-26 NOTE — Telephone Encounter (Signed)
Voice mail  11:30 am :  Please call regarding critical labs .    Case umber : [id number]   Return call 12:11   Critical result references mycobacterial culture  taken on 11-15-20. This result is entered in Epic.  I notified agent critical results should not be left on voice mail. They should use option to speak with a clinical staff person.   Laurell Josephs, RN

## 2020-12-29 LAB — CP MYCOBACTERIAL IDENTIFICATION

## 2020-12-29 LAB — MYCOBACTERIA,CULT W/FLUOROCHROME SMEAR
MICRO NUMBER:: 11352504
SPECIMEN QUALITY:: ADEQUATE

## 2021-01-17 ENCOUNTER — Other Ambulatory Visit: Payer: Self-pay

## 2021-01-17 ENCOUNTER — Ambulatory Visit (INDEPENDENT_AMBULATORY_CARE_PROVIDER_SITE_OTHER): Payer: 59 | Admitting: Internal Medicine

## 2021-01-17 ENCOUNTER — Encounter: Payer: Self-pay | Admitting: Internal Medicine

## 2021-01-17 DIAGNOSIS — A319 Mycobacterial infection, unspecified: Secondary | ICD-10-CM | POA: Diagnosis not present

## 2021-01-17 DIAGNOSIS — A318 Other mycobacterial infections: Secondary | ICD-10-CM

## 2021-01-17 NOTE — Progress Notes (Signed)
[facility]  Patient Active Problem List   Diagnosis Date Noted  . 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 Bronchiectasis without complication ([place]) 08/65/7846  . History of tuberculosis 04/21/2020  . Hypertension 04/21/2020  . Atherosclerotic peripheral vascular disease ([place]) 04/21/2020    Patient's Medications  New Prescriptions   No medications on file  Previous Medications   AZITHROMYCIN (ZITHROMAX) 500 MG TABLET    Take 1 tablet (500 mg total) by mouth daily.   BISOPROLOL-HYDROCHLOROTHIAZIDE (ZIAC) 2.5-6.25 MG TABLET    Take 1 tablet by mouth daily.   ETHAMBUTOL (MYAMBUTOL) 400 MG TABLET    Take 2 tablets (800 mg total) by mouth daily, THEN 2 tablets (800 mg total) daily.   RIFAMPIN (RIFADIN) 300 MG CAPSULE    Take 2 capsules (600 mg total) by mouth daily.   SODIUM CHLORIDE HYPERTONIC 3 % NEBULIZER SOLUTION    Take by nebulization every 8 (eight) hours.   TRIAMCINOLONE (KENALOG) 0.025 % OINTMENT    Apply 1 application topically 2 (two) times daily.  Modified Medications   No medications on file  Discontinued Medications   No medications on file    Subjective: [facility] Mendoza is in for her routine follow-up visit.  She met with Dr. Brigitte Pulse recently at [facility].  He has arranged an evaluation by thoracic surgery, Dr. Elenor Quinones, at [facility] next week to consider partial lung resection.  If surgery is an option she says that she would certainly consider it.  He recommended the following antibiotic regimen for 3 months prior to any planned surgery.  - Azithromycin 250 mg daily - Rifampin 600 mg daily - Ethambutol 1000 mg daily - Amikacin 1000 mg IV 3x/weekly - Imipenem 1000 mg IV q12h - Tigecycline 50 mg IV daily - N-acetylcysteine 600 mg twice daily (for potential otoprotective effect while on amikacin)  She says that she is now  willing to consider PICC placement and IV antibiotic therapy if it is needed.  She says that her cough is actually been much better over the last several weeks.  She says that she has not been coughing at all but remains quite short of breath.  She has been able to walk about a mile and a half each day on the treadmill.  She breaks up her walking into the 1/4 mile segments.  Review of Systems: Review of Systems  Constitutional: Negative for chills, diaphoresis and fever.  Respiratory: Positive for shortness of breath. Negative for cough, hemoptysis and sputum production.   Cardiovascular: Negative for chest pain.    Past Medical History:  Diagnosis Date  . Hypertension     Social History   Tobacco Use  . Smoking status: Never Smoker  . Smokeless tobacco: Never Used  Vaping Use  . Vaping Use: Never used  Substance Use Topics  . Alcohol use: Never  . Drug use: Never    Family History  Problem Relation Age of Onset  . Cancer Mother   . Heart attack Father     No Known Allergies  Objective: Vitals:   01/17/21 1427  BP: 128/80  Pulse: 75  Temp: 98 F (36.7 C)  TempSrc: Oral  SpO2: 97%  Weight: 116 lb (52.6 kg)  Height: 5' 1"  (1.549 m)   Body mass index is 21.92 kg/m.  Physical Exam Constitutional:  Comments: Her spirits are good.  Cardiovascular:     Rate and Rhythm: Normal rate and regular rhythm.     Heart sounds: No murmur heard.   Pulmonary:     Effort: Pulmonary effort is normal.     Breath sounds: Rales present.  Psychiatric:        Mood and Affect: Mood normal.     Lab Results Sputum culture 11/15/2021 Mycobacterium abscessus  Problem List Items Addressed This Visit      High   Mycobacterium abscessus infection    She will continue azithromycin, ethambutol and rifampin for now.  I will request extra susceptibility testing for her recent Mycobacterium abscessus isolate including clofazimine and bedaquiline.  I will see her back in a few  weeks after she meets with Dr. Elenor Quinones.          Michel Bickers, [place] for [place] [phone] pager   [phone] cell 01/17/2021, 2:52 PM

## 2021-01-17 NOTE — Assessment & Plan Note (Signed)
She will continue azithromycin, ethambutol and rifampin for now.  I will request extra susceptibility testing for her recent Mycobacterium abscessus isolate including clofazimine and bedaquiline.  I will see her back in a few weeks after she meets with Dr. Ewing Schlein.

## 2021-01-31 ENCOUNTER — Telehealth: Payer: Self-pay

## 2021-01-31 ENCOUNTER — Ambulatory Visit (INDEPENDENT_AMBULATORY_CARE_PROVIDER_SITE_OTHER): Payer: 59 | Admitting: Internal Medicine

## 2021-01-31 ENCOUNTER — Other Ambulatory Visit: Payer: Self-pay

## 2021-01-31 ENCOUNTER — Encounter: Payer: Self-pay | Admitting: Internal Medicine

## 2021-01-31 DIAGNOSIS — A319 Mycobacterial infection, unspecified: Secondary | ICD-10-CM | POA: Diagnosis not present

## 2021-01-31 DIAGNOSIS — A318 Other mycobacterial infections: Secondary | ICD-10-CM

## 2021-01-31 DIAGNOSIS — A310 Pulmonary mycobacterial infection: Secondary | ICD-10-CM

## 2021-01-31 MED ORDER — ETHAMBUTOL HCL 400 MG PO TABS: ORAL_TABLET | ORAL | 5 refills | Status: DC

## 2021-01-31 MED ORDER — TIGECYCLINE 50 MG IV SOLR: 50.0000 mg | Freq: Every day | INTRAVENOUS | Status: DC

## 2021-01-31 MED ORDER — IMIPENEM-CILASTATIN 500 MG IV SOLR: 1000.0000 mg | Freq: Two times a day (BID) | INTRAVENOUS | Status: DC

## 2021-01-31 MED ORDER — ACETYLCYSTEINE 600 MG PO CAPS: 600.0000 mg | ORAL_CAPSULE | Freq: Two times a day (BID) | ORAL | 5 refills | Status: DC

## 2021-01-31 MED ORDER — AZITHROMYCIN 250 MG PO TABS: 250.0000 mg | ORAL_TABLET | Freq: Every day | ORAL | 5 refills | Status: DC

## 2021-01-31 MED ORDER — RIFAMPIN 300 MG PO CAPS: 600.0000 mg | ORAL_CAPSULE | Freq: Every day | ORAL | 5 refills | Status: DC

## 2021-01-31 MED ORDER — AMIKACIN IV (FOR PTA / DISCHARGE USE ONLY): 1000.0000 mg | INTRAVENOUS | Status: DC

## 2021-01-31 NOTE — Progress Notes (Signed)
[facility] for Infectious Disease  Patient Active Problem List   Diagnosis Date Noted  . 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 Bronchiectasis without complication ([place]) 37/90/2409  . History of tuberculosis 04/21/2020  . Hypertension 04/21/2020  . Atherosclerotic peripheral vascular disease ([place]) 04/21/2020    Patient's Medications  New Prescriptions   No medications on file  Previous Medications   AZITHROMYCIN (ZITHROMAX) 500 MG TABLET    Take 1 tablet (500 mg total) by mouth daily.   BISOPROLOL-HYDROCHLOROTHIAZIDE (ZIAC) 2.5-6.25 MG TABLET    Take 1 tablet by mouth daily.   ETHAMBUTOL (MYAMBUTOL) 400 MG TABLET    Take 2 tablets (800 mg total) by mouth daily, THEN 2 tablets (800 mg total) daily.   RIFAMPIN (RIFADIN) 300 MG CAPSULE    Take 2 capsules (600 mg total) by mouth daily.   SODIUM CHLORIDE HYPERTONIC 3 % NEBULIZER SOLUTION    Take by nebulization every 8 (eight) hours.   TRIAMCINOLONE (KENALOG) 0.025 % OINTMENT    Apply 1 application topically 2 (two) times daily.  Modified Medications   No medications on file  Discontinued Medications   No medications on file    Subjective: [facility] Mendoza is in for her routine follow-up visit.  She met with Dr. Brigitte Pulse recently at [facility].  He recommended the following antibiotic regimen for 2-3 months prior to any planned surgery.  - Azithromycin 250 mg daily - Rifampin 600 mg daily - Ethambutol 1000 mg daily - Amikacin 1000 mg IV 3x/weekly - Imipenem 1000 mg IV q12h - Tigecycline 50 mg IV daily - N-acetylcysteine 600 mg twice daily (for potential otoprotective effect while on amikacin)  He has arranged an evaluation by thoracic surgery, Dr. Elenor Quinones, at [facility].  She saw him last week and he recommended surgery. She says that she is now willing to consider PICC placement and IV antibiotic therapy if it  is needed.    Review of Systems: Review of Systems  Constitutional: Positive for malaise/fatigue. Negative for chills, diaphoresis and fever.  Respiratory: Positive for shortness of breath. Negative for cough, hemoptysis and sputum production.   Cardiovascular: Negative for chest pain.    Past Medical History:  Diagnosis Date  . Hypertension     Social History   Tobacco Use  . Smoking status: Never Smoker  . Smokeless tobacco: Never Used  Vaping Use  . Vaping Use: Never used  Substance Use Topics  . Alcohol use: Never  . Drug use: Never    Family History  Problem Relation Age of Onset  . Cancer Mother   . Heart attack Father     No Known Allergies  Objective: Vitals:   01/31/21 1414  BP: 134/79  Pulse: 74  Temp: 98.1 F (36.7 C)  TempSrc: Oral  SpO2: 96%  Weight: 115 lb (52.2 kg)  Height: _0  (1.549 m)   Body mass index is 21.73 kg/m.  Physical Exam Constitutional:      Comments: Her spirits are good.  She has many questions.  Cardiovascular:     Rate and Rhythm: Normal rate and regular rhythm.     Heart sounds: No murmur heard.   Pulmonary:     Effort: Pulmonary effort is normal.     Breath sounds: Rales present.  Psychiatric:        Mood and Affect: Mood normal.  Lab Results Sputum culture 11/15/2021 Mycobacterium abscessus  Problem List Items Addressed This Visit      High   Mycobacterium abscessus infection    I will order PICC placement and a baseline audiogram before starting her on her 3 new intravenous antibiotics to the long with her current 3 drug oral regimen.  Talked to her at length about potential side effects and complications.  I will see her back next week.          Michel Bickers, MD [facility] for [facility] [phone] pager   [phone] cell 01/31/2021, 2:33 PM

## 2021-01-31 NOTE — Assessment & Plan Note (Signed)
I will order PICC placement and a baseline audiogram before starting her on her 3 new intravenous antibiotics to the long with her current 3 drug oral regimen.  Talked to her at length about potential side effects and complications.  I will see her back next week.

## 2021-01-31 NOTE — Telephone Encounter (Signed)
New antibiotic orders per Dr. Orvan Falconer. Patient scheduled for PICC placement 02/06/21 at 12pm. Patient made aware of appointment time and location and is agreeable, she knows she will receive the first doses of her medication at short stay after PICC placement. Orders faxed to [facility] short stay with receipt of successful fax transmission.   Patient has [facility], RN faxed orders to [facility] at [phone] with receipt of successful transmission. RN left message for Vernona Rieger with [facility] requesting callback to make her aware of new PICC orders and PICC appointment time.   Patient needs baseline audiogram before starting antibiotics, this is scheduled for 02/01/21. Patient aware of appointment time and location and is agreeable.   Sandie Ano, RN

## 2021-02-01 ENCOUNTER — Telehealth: Payer: Self-pay | Admitting: Internal Medicine

## 2021-02-01 ENCOUNTER — Ambulatory Visit: Payer: 59 | Attending: Internal Medicine | Admitting: Audiologist

## 2021-02-01 DIAGNOSIS — Z79899 Other long term (current) drug therapy: Secondary | ICD-10-CM | POA: Diagnosis present

## 2021-02-01 DIAGNOSIS — Z5181 Encounter for therapeutic drug level monitoring: Secondary | ICD-10-CM | POA: Diagnosis present

## 2021-02-01 DIAGNOSIS — H9193 Unspecified hearing loss, bilateral: Secondary | ICD-10-CM | POA: Diagnosis present

## 2021-02-01 LAB — COMPREHENSIVE METABOLIC PANEL
AG Ratio: 1.2 (calc) (ref 1.0–2.5)
ALT: 21 U/L (ref 6–29)
AST: 22 U/L (ref 10–35)
Albumin: 4.4 g/dL (ref 3.6–5.1)
Alkaline phosphatase (APISO): 87 U/L (ref 37–153)
BUN: 11 mg/dL (ref 7–25)
CO2: 30 mmol/L (ref 20–32)
Calcium: 10.6 mg/dL — ABNORMAL HIGH (ref 8.6–10.4)
Chloride: 94 mmol/L — ABNORMAL LOW (ref 98–110)
Creat: 0.68 mg/dL (ref 0.50–0.99)
Globulin: 3.7 g/dL (calc) (ref 1.9–3.7)
Glucose, Bld: 82 mg/dL (ref 65–99)
Potassium: 4.8 mmol/L (ref 3.5–5.3)
Sodium: 133 mmol/L — ABNORMAL LOW (ref 135–146)
Total Bilirubin: 0.5 mg/dL (ref 0.2–1.2)
Total Protein: 8.1 g/dL (ref 6.1–8.1)

## 2021-02-01 LAB — CBC
HCT: 45.7 % — ABNORMAL HIGH (ref 35.0–45.0)
Hemoglobin: 15.5 g/dL (ref 11.7–15.5)
MCH: 31.3 pg (ref 27.0–33.0)
MCHC: 33.9 g/dL (ref 32.0–36.0)
MCV: 92.1 fL (ref 80.0–100.0)
MPV: 9.5 fL (ref 7.5–12.5)
Platelets: 431 10*3/uL — ABNORMAL HIGH (ref 140–400)
RBC: 4.96 10*6/uL (ref 3.80–5.10)
RDW: 11.7 % (ref 11.0–15.0)
WBC: 8.5 10*3/uL (ref 3.8–10.8)

## 2021-02-01 NOTE — Telephone Encounter (Signed)
Black, Anson Fret, AUD  Cliffton Asters, MD Dr. Mirian Mo has essentially normal hearing. The range of her hearing that is most sensitive to ototoxic damage is at a higher pitch than needed to understand speech. Please see report for details and message with questions or concerns. The audiogram and print out of her DPOAE responses are scanned into media.  Ammie Ferrier Au.D. CCC-A  Audiologist

## 2021-02-01 NOTE — Telephone Encounter (Signed)
Yes, weekly.  Thanks.

## 2021-02-01 NOTE — Telephone Encounter (Signed)
RN faxed updated orders to include Amikacin trough per Dr. Orvan Falconer to [facility].   Sandie Ano, RN

## 2021-02-01 NOTE — Procedures (Signed)
Outpatient Audiology and [facility] [address] [place], [place]  [address] [phone]  Ototoxic Monitoring Baseline Audiologic Evaluation   NAME: Victoria Mendoza     DOB:   12-Oct-1956      MRN: [id number]                                                                                     DATE: 02/01/2021     REFERENT: Dr. Cliffton Asters STATUS: Outpatient DIAGNOSIS: Audiologic Evaluation for the Purpose of Ototoxic Monitoring  History: Victoria Mendoza was seen for an audiological evaluation. Victoria Mendoza is receiving a hearing evaluation to establish their baseline hearing thresholds before treatment with ototoxic mediation.  Victoria Mendoza has been diagnosed with the following:     Patient Active Problem List   Diagnosis Date Noted  . 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 Bronchiectasis without complication (HCC) 09/12/2020  . History of tuberculosis 04/21/2020  . Hypertension 04/21/2020  . Atherosclerotic peripheral vascular disease (HCC) 04/21/2020    Victoria Mendoza is currently prescribed the following:  Current Outpatient Medications  Medication Sig Dispense Refill  . Acetylcysteine 600 MG CAPS Take 1 capsule (600 mg total) by mouth 2 (two) times daily. 60 capsule 5  . amikacin (AMIKIN) IVPB Inject 1,000 mg into the vein 3 (three) times a week.    Marland Kitchen azithromycin (ZITHROMAX) 250 MG tablet Take 1 tablet (250 mg total) by mouth daily. 30 tablet 5  . bisoprolol-hydrochlorothiazide (ZIAC) 2.5-6.25 MG tablet Take 1 tablet by mouth daily.    Marland Kitchen ethambutol (MYAMBUTOL) 400 MG tablet Take 2-1/2 tablets by mouth daily 75 tablet 5  . imipenem-cilastatin (PRIMAXIN) 500 MG injection Inject 1,000 mg into the vein every 12 (twelve) hours. 1 each   . rifampin (RIFADIN) 300 MG capsule Take 2 capsules (600 mg total) by mouth daily. 60 capsule 5  . sodium chloride HYPERTONIC 3 % nebulizer solution Take by  nebulization every 8 (eight) hours. (Patient not taking: No sig reported) 750 mL 12  . tigecycline (TYGACIL) 50 MG injection Inject 50 mg into the vein daily. 1 each   . triamcinolone (KENALOG) 0.025 % ointment Apply 1 application topically 2 (two) times daily. (Patient not taking: No sig reported) 30 g 0   No current facility-administered medications for this visit.    Results from today's evaluation will be used to measure changes in patient's hearing thresholds during and six months after ototoxic medication exposure.   Evaluation:   Otoscopy showed a clear view of the tympanic membranes, bilaterally  Tympanometry results were consistent with normal middle ear function, bilaterally   High Frequency Distortion Product Otoacoustic Emissions (DPOAE's) were as follows:   Right ear present 1.5k-9k Hz and absent 10k-12k Hz.   Left ear present 3k-9k Hz and absent 1.5k, 2k, and 10k-12k Hz.   Audiometric testing was completed using conventional and high frequency audiometry with supraural high frequency transducer. Speech Detection Thresholds were consistent with pure tone averages. Word Recognition was excellent at conversation level. Pure tone thresholds show normal hearing in both ears. High frequency audiometry showed sloping thresholds after 8k  Hz    Sensitive Range to Ototoxicity (SRO)  is 10 k to 12.5 k Hz in both ears  three highest frequency thresholds below 100dB    Results:  The test results were reviewed with Victoria Mendoza.  Today's baseline audiological evaluation showed mostly normal hearing 250-8k Hz in both ears. Thresholds show slight hearing loss at to 8k Hz..  At future appointments only SRO threshold testing, DPOAEs and tympanometry will be performed to monitor hearing. Results will be compared to monitor change in hearing due to ototoxic medication.   Currenlty Mendoza SRO is at higher pitches than is needed to understand speech. Meaning if there is damage to her hearing it much  less likely impact her ability to understand speech in daily conversation.   During treatment with ototoxic medication hearing is more vulnerable to damage from high levels of noise. Make sure to keep headphone volume below 60% and wear headphones no longer than 60 minutes at a time. Avoid any noise louder than 85B, which is roughly equivalent to busy traffic noise. Anytime exposure to levels exceed 85dB wear well fitting earplugs with an NRR rating of at least 25dB. NRR stands for Noise Reduction Rating, and is how much the earplug will reduce the sound volume around you. At a loud concert or at a movie theatre volume can reach 100dB. Wearing an earplug with NRR of 25 will reduce the concert volume to a safe level of 75dB. Proper insertion of a generic foam earplug is necessary to get the full reduction in volume. Over the ear muffs with an NRR of 25 are also recommended and are not susceptible to leakage from improper insertion.  For guidance on how to wear earplugs the correct way visit: [email or web address]   Recommendations: 1.   Due to normal hearing today and use of Acetylcysteine:  - audiological screening is recommended once a month during treatment with aminoglycosides   - full evaluation recommended again 6 months after end of treatment. Hearing evaluation should be performed earlier if a change in hearing is perceived or patient develops tinnitus.  2.  Wear hearing protection with an NRR of at least 25dB whenever exposed to loud noise. Loud noise is any level above 85dB. For example if people are raising their voices to be heard, the volume is louder than 85dB.  3.  Wear headphones for only 60 minutes at a time, and never louder than 60% volume.    Ammie Ferrier  Audiologist, Au.D., CCC-A  02/01/2021  4:33 PM  Cc: Dr. Cliffton Asters

## 2021-02-01 NOTE — Telephone Encounter (Signed)
Received call from Vernona Rieger at [facility]. She received patient's orders, requesting most recent BUN and Creatinine. RN faxed requested labs to [facility]. She is wondering if provider would like to add an Amikacin trough, will route to provider.   RN relayed patient's PICC appointment time and location to [facility]. Charisse March that patient will receive first doses of medication at [facility] Stay. RN requested that nurse educator meet patient at [facility] appointment, Vernona Rieger states she will try to arrange that.   RN spoke to La[facility] at short stay to schedule first dose appointment.   Sandie Ano, RN

## 2021-02-02 NOTE — Telephone Encounter (Addendum)
RN spoke to Vernona Rieger at [facility]. Patient's copay was going to be $737, but they were able to get the patient [facility] copay assistance and the patient will not owe anything out of pocket. Per Vernona Rieger, [facility] will go to patient's home on 02/07/21 for home teaching and training. RN relayed to Vernona Rieger that patient will be getting first doses in short stay after PICC placement on 02/06/21.   Sandie Ano, RN

## 2021-02-03 ENCOUNTER — Other Ambulatory Visit (HOSPITAL_COMMUNITY): Payer: Self-pay

## 2021-02-06 ENCOUNTER — Ambulatory Visit (HOSPITAL_COMMUNITY)
Admission: RE | Admit: 2021-02-06 | Discharge: 2021-02-06 | Disposition: A | Discharge status: Home / Self Care | Payer: 59 | Source: Ambulatory Visit | Attending: Internal Medicine | Admitting: Internal Medicine

## 2021-02-06 ENCOUNTER — Other Ambulatory Visit: Payer: Self-pay | Admitting: Internal Medicine

## 2021-02-06 ENCOUNTER — Encounter (HOSPITAL_COMMUNITY)
Admission: RE | Admit: 2021-02-06 | Discharge: 2021-02-06 | Disposition: A | Discharge status: Home / Self Care | Payer: 59 | Source: Ambulatory Visit | Attending: Internal Medicine | Admitting: Internal Medicine

## 2021-02-06 ENCOUNTER — Other Ambulatory Visit: Payer: Self-pay

## 2021-02-06 ENCOUNTER — Telehealth: Payer: Self-pay

## 2021-02-06 DIAGNOSIS — A319 Mycobacterial infection, unspecified: Secondary | ICD-10-CM | POA: Diagnosis not present

## 2021-02-06 DIAGNOSIS — A318 Other mycobacterial infections: Secondary | ICD-10-CM

## 2021-02-06 MED ORDER — LIDOCAINE HCL 1 % IJ SOLN
INTRAMUSCULAR | Status: AC | PRN
Start: 2021-02-06 — End: 2021-02-06
  Administered 2021-02-06: 10 mL via INTRADERMAL

## 2021-02-06 MED ORDER — TIGECYCLINE 50 MG IV SOLR
50.0000 mg | Freq: Once | INTRAVENOUS | Status: AC
  Administered 2021-02-06: 50 mg via INTRAVENOUS
  Filled 2021-02-06: qty 50

## 2021-02-06 MED ORDER — HEPARIN SOD (PORK) LOCK FLUSH 100 UNIT/ML IV SOLN: 250.0000 [IU] | INTRAVENOUS | Status: DC | PRN

## 2021-02-06 MED ORDER — LIDOCAINE HCL 1 % IJ SOLN
INTRAMUSCULAR | Status: AC
  Filled 2021-02-06: qty 20

## 2021-02-06 MED ORDER — HEPARIN SOD (PORK) LOCK FLUSH 100 UNIT/ML IV SOLN: 250.0000 [IU] | Freq: Every day | INTRAVENOUS | Status: DC

## 2021-02-06 MED ORDER — SODIUM CHLORIDE 0.9 % IV SOLN
1000.0000 mg | Freq: Once | INTRAVENOUS | Status: AC
  Administered 2021-02-06: 1000 mg via INTRAVENOUS
  Filled 2021-02-06: qty 1000

## 2021-02-06 MED ORDER — DEXTROSE 5 % IV SOLN
1000.0000 mg | Freq: Once | INTRAVENOUS | Status: AC
  Administered 2021-02-06: 1000 mg via INTRAVENOUS
  Filled 2021-02-06: qty 4

## 2021-02-06 MED ORDER — HEPARIN SOD (PORK) LOCK FLUSH 100 UNIT/ML IV SOLN
INTRAVENOUS | Status: AC
  Administered 2021-02-06: 250 [IU]
  Filled 2021-02-06: qty 5

## 2021-02-06 NOTE — Telephone Encounter (Signed)
RN faxed signed lab orders to [facility]. Received receipt of successful fax transmission.   Sandie Ano, RN

## 2021-02-06 NOTE — Procedures (Signed)
Successful placement of single lumen PICC line to right basilic vein. Length 32cm Tip at lower SVC/RA PICC capped No complications Ready for use.  EBL < 5 mL   Hoyt Koch PA-C 02/06/2021 2:31 PM

## 2021-02-06 NOTE — Telephone Encounter (Signed)
Yes, peak and trough levels.

## 2021-02-06 NOTE — Discharge Instructions (Signed)
Amikacin injection What is this medicine? AMIKACIN (am i KAY sin) is an aminoglycoside antibiotic. It is used to treat certain kinds of bacterial infections. It will not work for colds, flu, or other viral infections. This medicine may be used for other purposes; ask your health care provider or pharmacist if you have questions. COMMON BRAND NAME(S): Amikin, Amikin Pediatric What should I tell my health care provider before I take this medicine? They need to know if you have any of these conditions:  dehydrated  hearing problems  kidney disease  myasthenia gravis  Parkinson's disease  an unusual or allergic reaction to amikacin or other antibiotics, sulfites, foods, dyes or preservatives  pregnant or trying to get pregnant  breast-feeding How should I use this medicine? This medicine is infused into a vein or injected into a muscle. It is usually given by a health care professional in a hospital or clinic setting. If you get this medicine at home, you will be taught how to prepare and give this medicine. Use exactly as directed. Take your medicine at regular intervals. Do not take your medicine more often than directed. It is important that you put your used needles and syringes in a special sharps container. Do not put them in a trash can. If you do not have a sharps container, call your pharmacist or healthcare provider to get one. Talk to your pediatrician regarding the use of this medicine in children. Special care may be needed. Overdosage: If you think you have taken too much of this medicine contact a poison control center or emergency room at once. NOTE: This medicine is only for you. Do not share this medicine with others. What if I miss a dose? If you miss a dose, use it as soon as you can. If it is almost time for your next dose, use only that dose. Do not use double or extra doses. What may interact with this medicine? Do not take this medicine with any of the following  medications:  cidofovir This medicine may also interact with the following medications:  amphotericin B  bacitracin  birth control pills  cisplatin  colistin  diuretics like ethacrynic acid or furosemide  other antibiotics This list may not describe all possible interactions. Give your health care provider a list of all the medicines, herbs, non-prescription drugs, or dietary supplements you use. Also tell them if you smoke, drink alcohol, or use illegal drugs. Some items may interact with your medicine. What should I watch for while using this medicine? Tell your doctor if your symptoms do not improve. You may need to have your blood checked while you are taking this medicine. Report any side effects to your doctor or healthcare professional. Be aware that side effects may occur in the weeks after you finish taking this medicine. You may get drowsy or dizzy. Do not drive, use machinery, or do anything that needs mental alertness until you know how this medicine affects you. Do not stand or sit up quickly, especially if you are an older patient. This reduces the risk of dizzy or fainting spells. This medicine may cause a decrease in vitamin B6 and vitamin B12. You should make sure that you get enough vitamin B6 and vitamin B12 while you are taking this medicine. Discuss the foods you eat and the vitamins you take with your health care professional. What side effects may I notice from receiving this medicine? Side effects that you should report to your doctor or health care professional  as soon as possible:  allergic reactions like skin rash, itching or hives, swelling of the face, lips, or tongue  difficulty breathing  hearing loss, ringing in the ears  dizziness, loss of balance  fever  less urine  low blood pressure  numbness, tingling  tremor  unusually weak or tired Side effects that usually do not require medical attention (report to your doctor or health care  professional if they continue or are bothersome):  headache  joint pains  nausea, vomiting  pain, irritation at site of injection This list may not describe all possible side effects. Call your doctor for medical advice about side effects. You may report side effects to FDA at 1-800-FDA-1088. Where should I keep my medicine? Keep out of the reach of children. If you are using this medicine at home, you will be instructed on how to store this medicine. Throw away any unused medicine after the expiration date on the label. Do not use if the solution is cloudy or contains any solids. NOTE: This sheet is a summary. It may not cover all possible information. If you have questions about this medicine, talk to your doctor, pharmacist, or health care provider.  2021 Elsevier/Gold Standard (2017-06-21 08:24:35)   Imipenem; Cilastatin Injection What is this medicine? IMIPENEM; CILASTATIN (i mi PEN em; sye la STAT in) is a carbapenem antibiotic. It treats some infections caused by bacteria. It will not work for colds, the flu, or other viruses. This medicine may be used for other purposes; ask your health care provider or pharmacist if you have questions. COMMON BRAND NAME(S): Primaxin What should I tell my health care provider before I take this medicine? They need to know if you have any of these conditions:  brain tumor or lesion  kidney disease  seizure disorder  an unusual or allergic reaction to imipenem, cilastatin, other antibiotics or medicines, foods, dyes, or preservatives  pregnant or trying to get pregnant  breast-feeding How should I use this medicine? This drug is injected into a vein. It is usually given by a health care provider in a hospital or clinic setting. If you get this drug at home, you will be taught how to prepare and give it. Use exactly as directed. Take it as directed on the prescription label at the same time every day. Keep taking it unless your health care  provider tells you to stop. It is important that you put your used needles and syringes in a special sharps container. Do not put them in a trash can. If you do not have a sharps container, call your pharmacist or healthcare provider to get one. Talk to your health care provider about the use of this drug in children. While it may be prescribed for children as young as newborns for selected conditions, precautions do apply. Overdosage: If you think you have taken too much of this medicine contact a poison control center or emergency room at once. NOTE: This medicine is only for you. Do not share this medicine with others. What if I miss a dose? It is important not to miss your dose. Call your health care provider if you are unable to keep an appointment. If you give yourself this drug at home and you miss a dose, take it as soon as you can. If it is almost time for your next dose, take only that dose. Do not take double or extra doses. What may interact with this medicine?  birth control pills  ganciclovir  probenecid  This list may not describe all possible interactions. Give your health care provider a list of all the medicines, herbs, non-prescription drugs, or dietary supplements you use. Also tell them if you smoke, drink alcohol, or use illegal drugs. Some items may interact with your medicine. What should I watch for while using this medicine? Tell your doctor or health care provider if your symptoms do not start to get better, if they get worse, or if you get new symptoms. Your doctor will monitor your condition and blood work as needed. This medicine may cause serious skin reactions. They can happen weeks to months after starting the medicine. Contact your health care provider right away if you notice fevers or flu-like symptoms with a rash. The rash may be red or purple and then turn into blisters or peeling of the skin. Or, you might notice a red rash with swelling of the face, lips or  lymph nodes in your neck or under your arms. Do not treat diarrhea with over the counter products. Contact your doctor if you have diarrhea that lasts more than 2 days or if it is severe and watery. What side effects may I notice from receiving this medicine? Side effects that you should report to your doctor or health care professional as soon as possible:  allergic reactions like skin rash, itching or hives, swelling of the face, lips, or tongue  breathing problems  changes in blood pressure  change in hearing  confusion  dark urine  dizziness  fast, irregular heartbeat  fever  general ill feeling or flu-like symptoms  light-colored stools  loss of appetite, nausea  muscle stiffness or twitch  pain at site where injected  redness, blistering, peeling or loosening of the skin, including inside the mouth  right upper belly pain  seizures  trouble passing urine or change in the amount of urine  unusually weak or tired  vomiting  yellowing of eyes or skin Side effects that usually do not require medical attention (report to your doctor or health care professional if they continue or are bothersome):  diarrhea  headache  stomach upset or pain  too much saliva in the mouth This list may not describe all possible side effects. Call your doctor for medical advice about side effects. You may report side effects to FDA at 1-800-FDA-1088. Where should I keep my medicine? Keep out of the reach of children and pets. You will be instructed on how to store this drug. Throw away any unused drug after the expiration date. NOTE: This sheet is a summary. It may not cover all possible information. If you have questions about this medicine, talk to your doctor, pharmacist, or health care provider.  2021 Elsevier/Gold Standard (2019-06-19 14:56:15)  Tigecycline injection What is this medicine? TIGECYCLINE (tye ge SYE kleen) is a glycylcycline antibiotic. It is used to  treat certain kinds of bacterial infections. It will not work for colds, flu, or other viral infections. This medicine may be used for other purposes; ask your health care provider or pharmacist if you have questions. COMMON BRAND NAME(S): Tygacil What should I tell my health care provider before I take this medicine? They need to know if you have any of these conditions:  liver disease  an unusual or allergic reaction to tigecycline, tetracycline antibiotics, other medicines, foods, dyes, or preservatives  pregnant or trying to get pregnant  breast-feeding How should I use this medicine? This medicine is for infusion into a vein. It is usually given  by a health care professional in a hospital or clinic setting. If you get this medicine at home, you will be taught how to prepare and give this medicine. Use exactly as directed. Take your medicine at regular intervals. Do not take your medicine more often than directed. It is important that you put your used needles and syringes in a special sharps container. Do not put them in a trash can. If you do not have a sharps container, call your pharmacist or health care provider to get one. Talk to your pediatrician regarding the use of this medicine in children. Special care may be needed. Overdosage: If you think you have taken too much of this medicine contact a poison control center or emergency room at once. NOTE: This medicine is only for you. Do not share this medicine with others. What if I miss a dose? If you miss a dose, take it as soon as you can. If it is almost time for your next dose, take only that dose. Do not take double or extra doses. What may interact with this medicine? This medicine may interact with the following medications:  birth control pills  certain medicines that treat or prevent blood clots like warfarin  cyclosporine  tacrolimus This list may not describe all possible interactions. Give your health care  provider a list of all the medicines, herbs, non-prescription drugs, or dietary supplements you use. Also tell them if you smoke, drink alcohol, or use illegal drugs. Some items may interact with your medicine. What should I watch for while using this medicine? Tell your health care professional if your symptoms do not start to get better or if they get worse. Do not treat diarrhea with over the counter products. Contact your health care professional if you have diarrhea that lasts more than 2 days or if it is severe and watery. This medicine can make you more sensitive to the sun. Keep out of the sun. If you cannot avoid being in the sun, wear protective clothing and sunscreen. Do not use sun lamps or tanning beds/booths. You may need blood work done while you are taking this medicine. Birth control may not work properly while you are taking this medicine. Talk to your health care professional about using an extra method of birth control. What side effects may I notice from receiving this medicine? Side effects that you should report to your doctor or health care professional as soon as possible:  allergic reactions like skin rash, itching or hives, swelling of the face, lips, or tongue  bloody or watery diarrhea  fever  signs and symptoms of liver injury like dark yellow or brown urine; general ill feeling or flu-like symptoms; light-colored stools; loss of appetite; nausea; right upper belly pain; unusually weak or tired; yellowing of the eyes or skin  signs and symptoms of low red blood cells or anemia such as unusually weak or tired; feeling faint or lightheaded; falls; breathing problems Side effects that usually do not require medical attention (report to your doctor or health care professional if they continue or are bothersome):  changes in taste  diarrhea  dizziness  headache  nausea, vomiting  pain, redness or irritation at site where injected  stomach pain  upset  stomach  vaginal discharge, itching or odor This list may not describe all possible side effects. Call your doctor for medical advice about side effects. You may report side effects to FDA at 1-800-FDA-1088. Where should I keep my medicine? Keep out  of the reach of children. If you are using this medicine at home, you will be instructed on how to store it. Throw away any unused medicine after the expiration date on the label. NOTE: This sheet is a summary. It may not cover all possible information. If you have questions about this medicine, talk to your doctor, pharmacist, or health care provider.  2021 Elsevier/Gold Standard (2019-05-21 16:58:58)

## 2021-02-06 NOTE — Telephone Encounter (Signed)
Received call from Vernona Rieger at [facility], pharmacy is requesting orders for an amikacin peak x 1 and weekly amikacin trough. Will route to provider.   Sandie Ano, RN

## 2021-02-09 ENCOUNTER — Other Ambulatory Visit: Payer: Self-pay

## 2021-02-09 ENCOUNTER — Encounter: Payer: Self-pay | Admitting: Internal Medicine

## 2021-02-09 ENCOUNTER — Telehealth (INDEPENDENT_AMBULATORY_CARE_PROVIDER_SITE_OTHER): Payer: 59 | Admitting: Internal Medicine

## 2021-02-09 DIAGNOSIS — A319 Mycobacterial infection, unspecified: Secondary | ICD-10-CM

## 2021-02-09 DIAGNOSIS — A318 Other mycobacterial infections: Secondary | ICD-10-CM

## 2021-02-09 NOTE — Progress Notes (Signed)
Virtual Visit via Telephone Note  I connected with Victoria Mendoza on 02/09/21 at 11:30 AM EDT by telephone and verified that I am speaking with the correct person using two identifiers.  Location: Patient: Home Provider: [facility]   I discussed the limitations, risks, security and privacy concerns of performing an evaluation and management service by telephone and the availability of in person appointments. I also discussed with the patient that there may be a patient responsible charge related to this service. The patient expressed understanding and agreed to proceed.   History of Present Illness: I called and spoke with Altheria today.  She continues to take oral azithromycin, ethambutol and rifabutin.  A PICC was placed and she started on IV imipenem, tigecycline and amikacin 3 days ago.  She says that she has been feeling much worse since starting the IV antibiotics.  She has a little bit of soreness around her PICC line.  He says that she feels very weak and shaky.  She had one episode of mild diarrhea this morning.  She is very thirsty.  She has not been coughing any this week but feels more short of breath.  She has not had any fever or nausea.   Observations/Objective:   Assessment and Plan: She has just started a very complicated antibiotic regimen for her combined Mycobacterium avium and abscessus pneumonia.  Given her current worsening symptoms she says she has been considering stopping the IV antibiotics.  If she has continued difficulty I will consider stopping the IV antibiotics and then restarting them one by one to see if we can determine the culprit and whether or not there is anything we can do to better manage her reaction.  Follow Up Instructions: She will continue her current 6 antibiotic regimen for now I will arrange phone follow-up in 1 week   I discussed the assessment and treatment plan with the patient. The patient was provided an opportunity to ask questions and all were  answered. The patient agreed with the plan and demonstrated an understanding of the instructions.   The patient was advised to call back or seek an in-person evaluation if the symptoms worsen or if the condition fails to improve as anticipated.  I provided 16 minutes of non-face-to-face time during this encounter.   Cliffton Asters, MD

## 2021-02-10 ENCOUNTER — Telehealth: Payer: Self-pay

## 2021-02-10 NOTE — Telephone Encounter (Signed)
RN faxed signed orders for weekly liver function panel to [facility] at [phone]. Received receipt of successful fax transmission.   RN spoke to Vernona Rieger at [facility] to make her aware that additional lab orders were being faxed.   Sandie Ano, RN

## 2021-02-10 NOTE — Telephone Encounter (Signed)
-----   Message from Blanchard Kelch, NP sent at 02/10/2021  1:06 PM EDT ----- Aundra Millet do you mind calling to add a weekly liver function panel for her? She is on quite a few things that could be damaging.   Thank you.

## 2021-02-13 ENCOUNTER — Telehealth: Payer: Self-pay

## 2021-02-13 NOTE — Telephone Encounter (Signed)
Received call today from [place], Rn regarding patient concerns. RN states that after getting labs patient informed her that she has not been taking IV antibiotics since Saturday. Patient is requesting she stop infusion and have picc line removed. Does not want to move forward with IV. Spoke with patient who states she was not able to tolerate how she felt after infusions.

## 2021-02-14 ENCOUNTER — Encounter: Payer: Self-pay | Admitting: Internal Medicine

## 2021-02-14 ENCOUNTER — Telehealth: Payer: Self-pay

## 2021-02-14 ENCOUNTER — Other Ambulatory Visit: Payer: Self-pay

## 2021-02-14 ENCOUNTER — Telehealth (INDEPENDENT_AMBULATORY_CARE_PROVIDER_SITE_OTHER): Payer: 59 | Admitting: Internal Medicine

## 2021-02-14 DIAGNOSIS — A319 Mycobacterial infection, unspecified: Secondary | ICD-10-CM

## 2021-02-14 DIAGNOSIS — A318 Other mycobacterial infections: Secondary | ICD-10-CM

## 2021-02-14 NOTE — Telephone Encounter (Signed)
RN spoke with Jonny Ruiz at [facility] to relay verbal orders per Dr. Orvan Falconer to continue with Tigecycline daily, if this goes well patient is to restart Amikacin three times a week (Monday/Wednesday/Friday) on 02/20/21, and to hold the imipenem for now. Orders repeated and verified. RN also requested that they provide patient with new PICC extension tubing as patient requests it.   Sandie Ano, RN

## 2021-02-14 NOTE — Progress Notes (Signed)
Virtual Visit via Telephone Note  I connected with Victoria Mendoza on 02/14/21 at  2:00 PM EDT by telephone and verified that I am speaking with the correct person using two identifiers.  Location: Patient: Home Provider: [facility]   I discussed the limitations, risks, security and privacy concerns of performing an evaluation and management service by telephone and the availability of in person appointments. I also discussed with the patient that there may be a patient responsible charge related to this service. The patient expressed understanding and agreed to proceed.   History of Present Illness: Victoria Mendoza called yesterday informing us that she was not tolerating her IV antibiotic therapy.  She tells me that every time she took one of her IV antibiotics she began to shake and quiver all over.  She became so fatigued that she could not walk around her house.  She stopped taking her IV antibiotics over the weekend and said that she wanted to have the PICC removed.  She is not sure if her symptoms are due to imipenem, tigecycline or amikacin.  She says that the PICC line is uncomfortable.   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  I told her that she would probably not be a candidate for lung surgery either.  I suggested an alternative to stopping IV therapy now.  I suggested restarting tigecycline daily now.  If she does well with that she will restart amikacin on 02/20/2021.  She will hold imipenem for now.  She will continue taking her oral azithromycin, ethambutol and rifampin.  She is in agreement with this plan.  Follow Up Instructions: Leave PICC in for now Continue azithromycin, ethambutol and rifampin Restart daily tigecycline Restart amikacin every Monday, Wednesday and Friday, 02/20/2021 Hold imipenem Phone follow-up visit on 02/21/2021    I discussed the assessment and treatment plan with the patient. The patient was provided an opportunity to ask questions and all were answered. The patient agreed with the plan and demonstrated an understanding of the instructions.   The patient was advised to call back or seek an in-person evaluation if the symptoms worsen or if the condition fails to improve as anticipated.  I provided 15 minutes of non-face-to-face time during this encounter.   Cliffton Asters, MD

## 2021-02-14 NOTE — Telephone Encounter (Signed)
Patient scheduled for 2 pm 02/14/21 phone visit.   Sandie Ano, RN

## 2021-02-14 NOTE — Telephone Encounter (Signed)
Please see if she can do a phone visit today at 2 PM.  Thanks.

## 2021-02-16 ENCOUNTER — Ambulatory Visit: Payer: 59 | Admitting: Internal Medicine

## 2021-02-17 NOTE — Telephone Encounter (Signed)
HH called office to confirm new orders for IV antibiotics. Relayed orders from 3/22. No additional information needed at this time.

## 2021-02-21 ENCOUNTER — Telehealth: Payer: Self-pay

## 2021-02-21 ENCOUNTER — Other Ambulatory Visit: Payer: Self-pay

## 2021-02-21 ENCOUNTER — Telehealth (INDEPENDENT_AMBULATORY_CARE_PROVIDER_SITE_OTHER): Payer: 59 | Admitting: Internal Medicine

## 2021-02-21 ENCOUNTER — Encounter: Payer: Self-pay | Admitting: Internal Medicine

## 2021-02-21 DIAGNOSIS — A318 Other mycobacterial infections: Secondary | ICD-10-CM

## 2021-02-21 DIAGNOSIS — A319 Mycobacterial infection, unspecified: Secondary | ICD-10-CM

## 2021-02-21 NOTE — Progress Notes (Signed)
Virtual Visit via Telephone Note  I connected with Victoria Mendoza on 02/21/21 at  2:00 PM EDT by telephone and verified that I am speaking with the correct person using two identifiers.  Location: Patient: Home Provider: [facility]   I discussed the limitations, risks, security and privacy concerns of performing an evaluation and management service by telephone and the availability of in person appointments. I also discussed with the patient that there may be a patient responsible charge related to this service. The patient expressed understanding and agreed to proceed.   History of Present Illness: Victoria Mendoza restarted IV tigecycline 1 week ago and so far has not had any problems tolerating it.  She was supposed to restart IV amikacin yesterday but did not because she noted that the supply that she had said that it was expired.   Observations/Objective:   Assessment and Plan: She will restart IV amikacin tomorrow when a new supply arrives and continue taking IV tigecycline.  Follow Up Instructions: Continue IV tigecycline Restart IV amikacin tomorrow Phone follow-up in 1 week   I discussed the assessment and treatment plan with the patient. The patient was provided an opportunity to ask questions and all were answered. The patient agreed with the plan and demonstrated an understanding of the instructions.   The patient was advised to call back or seek an in-person evaluation if the symptoms worsen or if the condition fails to improve as anticipated.  I provided 15 minutes of non-face-to-face time during this encounter.   Cliffton Asters, MD

## 2021-02-21 NOTE — Telephone Encounter (Signed)
Patient states she has not started Amikacin because the supply she has is expired. RN spoke with Amy at [facility], requested that they send new supply of Amikacin to patient as she will be restarting it tomorrow per Dr. Orvan Falconer. Confirmed with Amy that patient will now be taking tigecycline and Amikacin. Amy verbalized understanding, she states Amikacin will be sent today so that patient can start tomorrow.   Sandie Ano, RN

## 2021-02-28 ENCOUNTER — Other Ambulatory Visit (HOSPITAL_COMMUNITY): Payer: Self-pay

## 2021-02-28 ENCOUNTER — Encounter: Payer: Self-pay | Admitting: Internal Medicine

## 2021-02-28 ENCOUNTER — Telehealth (INDEPENDENT_AMBULATORY_CARE_PROVIDER_SITE_OTHER): Payer: 59 | Admitting: Internal Medicine

## 2021-02-28 ENCOUNTER — Other Ambulatory Visit: Payer: Self-pay

## 2021-02-28 DIAGNOSIS — A319 Mycobacterial infection, unspecified: Secondary | ICD-10-CM | POA: Diagnosis not present

## 2021-02-28 DIAGNOSIS — A310 Pulmonary mycobacterial infection: Secondary | ICD-10-CM | POA: Diagnosis not present

## 2021-02-28 DIAGNOSIS — A318 Other mycobacterial infections: Secondary | ICD-10-CM

## 2021-02-28 NOTE — Progress Notes (Signed)
Virtual Visit via Telephone Note  I connected with Victoria Mendoza on 02/28/21 at  3:30 PM EDT by telephone and verified that I am speaking with the correct person using two identifiers.  Location: Patient: Home Provider: [facility]   I discussed the limitations, risks, security and privacy concerns of performing an evaluation and management service by telephone and the availability of in person appointments. I also discussed with the patient that there may be a patient responsible charge related to this service. The patient expressed understanding and agreed to proceed.   History of Present Illness: I called and spoke with Victoria Mendoza today.  She has been back on IV tigecycline for 2 weeks and IV amikacin for 1 week.  She remains extremely fatigued with dyspnea on exertion but does not think that her symptoms are any worse since restarting IV antibiotics.  She tells me that she is very reluctant to restart IV imipenem.  She believes that was the cause of her leg more severe symptoms the first time she tried it and she is really struggling with the number of doses of IV antibiotic therapy she is required to take.  She is still taking oral azithromycin, ethambutol and rifampin.   Observations/Objective:   Assessment and Plan: Victoria Mendoza is really struggling clinically and psychologically.  She is now having fairly extreme difficulties with simple daily tasks because of fatigue and dyspnea.  She is also struggling under the burden of all of her antibiotic therapies.  She had difficulty tolerating imipenem.  She is also very reluctant to start multiple extra doses of IV antibiotic therapy that would be required with a repeat trial of imipenem or switch to meropenem.  Her ertapenem is unlikely to be very effective against Mycobacterium abscessus.  I will see if her insurance will cover oral omadacycline to add to her regimen.  Follow Up Instructions: Continue current antibiotic regimen consisting of overall  azithromycin, ethambutol and rifampin along with IV tigecycline and amikacin  We are requesting prior authorization of omadacycline from her insurance carrier   I discussed the assessment and treatment plan with the patient. The patient was provided an opportunity to ask questions and all were answered. The patient agreed with the plan and demonstrated an understanding of the instructions.   The patient was advised to call back or seek an in-person evaluation if the symptoms worsen or if the condition fails to improve as anticipated.  I provided 18 minutes of non-face-to-face time during this encounter.   Cliffton Asters, MD

## 2021-03-01 ENCOUNTER — Other Ambulatory Visit (HOSPITAL_COMMUNITY): Payer: Self-pay

## 2021-03-01 ENCOUNTER — Telehealth: Payer: Self-pay

## 2021-03-01 NOTE — Telephone Encounter (Signed)
[facility] Patient Advocate Encounter   Received notification from [employer] that prior authorization for Victoria Mendoza is required.   PA submitted on 03/01/21 Key [id number] Status is pending    [facility] will continue to follow.   Clearance Coots, CPhT Specialty Pharmacy Patient [facility] for Infectious Disease Phone: [phone] Fax:  [phone]

## 2021-03-03 ENCOUNTER — Telehealth: Payer: Self-pay

## 2021-03-03 ENCOUNTER — Other Ambulatory Visit (HOSPITAL_COMMUNITY): Payer: Self-pay

## 2021-03-03 NOTE — Telephone Encounter (Signed)
[facility] Patient Advocate Encounter  Prior Authorization for Victoria Mendoza has been approved.    PA# [id number] Effective dates: 03/03/21 through 03/02/22  Patients co-pay is $1,132.10.   I have not found any other funds to help pay with the copay .  [facility] will continue to follow.  Victoria Mendoza, CPhT Specialty Pharmacy Patient [facility] for Infectious Disease Phone: [phone] Fax:  [phone]

## 2021-03-04 NOTE — Telephone Encounter (Signed)
Please see if Irving Burton or Joselyn Glassman would call [place] and see if she can afford it. Thanks.  Jonny Ruiz

## 2021-03-06 NOTE — Telephone Encounter (Signed)
You are welcome. I did message Irving Burton.

## 2021-03-07 ENCOUNTER — Telehealth: Payer: Self-pay

## 2021-03-07 NOTE — Telephone Encounter (Signed)
Patient called stating she was going to stop the IV antibiotics. Patient is stating she is no longer able to tolerate the medication.  Patient is having a lot of fatigue and leg numbness.  Patient scheduled with Dr. Elinor Parkinson tomorrow. Jullisa Grigoryan T [name]

## 2021-03-08 ENCOUNTER — Encounter: Payer: Self-pay | Admitting: Infectious Diseases

## 2021-03-08 ENCOUNTER — Telehealth (INDEPENDENT_AMBULATORY_CARE_PROVIDER_SITE_OTHER): Payer: 59 | Admitting: Infectious Diseases

## 2021-03-08 ENCOUNTER — Other Ambulatory Visit: Payer: Self-pay

## 2021-03-08 DIAGNOSIS — A310 Pulmonary mycobacterial infection: Secondary | ICD-10-CM | POA: Diagnosis not present

## 2021-03-08 DIAGNOSIS — A319 Mycobacterial infection, unspecified: Secondary | ICD-10-CM | POA: Diagnosis not present

## 2021-03-08 DIAGNOSIS — A318 Other mycobacterial infections: Secondary | ICD-10-CM

## 2021-03-08 DIAGNOSIS — Z5181 Encounter for therapeutic drug level monitoring: Secondary | ICD-10-CM | POA: Diagnosis not present

## 2021-03-08 NOTE — Progress Notes (Signed)
Virtual Visit via Video Note  I connected with@ on 03/08/21 at  2:45 PM EDT by a video enabled telemedicine application and verified that I am speaking with the correct person using two identifiers.  Location: Patient: Home  Provider: [facility]   I discussed the limitations of evaluation and management by telemedicine and the availability of in person appointments. The patient expressed understanding and agreed to proceed.  [place] for Infectious Disease  Patient Active Problem List   Diagnosis Date Noted  . Bronchiectasis without complication ([place]) 51/12/5850  . 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 Mycobacterium avium infection ([place]) 04/21/2020  . History of tuberculosis 04/21/2020  . Hypertension 04/21/2020  . Atherosclerotic peripheral vascular disease ([place]) 04/21/2020    Patient's Medications  New Prescriptions   No medications on file  Previous Medications   ACETYLCYSTEINE 600 MG CAPS    Take 1 capsule (600 mg total) by mouth 2 (two) times daily.   AMIKACIN (AMIKIN) IVPB    Inject 1,000 mg into the vein 3 (three) times a week.   AZITHROMYCIN (ZITHROMAX) 250 MG TABLET    Take 1 tablet (250 mg total) by mouth daily.   BISOPROLOL-HYDROCHLOROTHIAZIDE (ZIAC) 2.5-6.25 MG TABLET    Take 1 tablet by mouth daily.   ETHAMBUTOL (MYAMBUTOL) 400 MG TABLET    Take 2-1/2 tablets by mouth daily   IMIPENEM-CILASTATIN (PRIMAXIN) 500 MG INJECTION    Inject 1,000 mg into the vein every 12 (twelve) hours.   RIFAMPIN (RIFADIN) 300 MG CAPSULE    Take 2 capsules (600 mg total) by mouth daily.   SODIUM CHLORIDE HYPERTONIC 3 % NEBULIZER SOLUTION    Take by nebulization every 8 (eight) hours.   TIGECYCLINE (TYGACIL) 50 MG INJECTION    Inject 50 mg into the vein daily.   TRIAMCINOLONE (KENALOG) 0.025 % OINTMENT    Apply 1 application topically 2 (two) times daily.  Modified Medications   No medications on file  Discontinued Medications   No medications on file    History of Present  Illness: Victoria Mendoza is a 65 year old female who is being followed by my partner Dr Megan Salon for Pulmonary MAC and M abscessus. She is currently on therapy with PO azithromycin, PO ethambutol and PO Rifampin along with IV tigecycline and IV amikacin. This is my first interaction with her. This was an urgent visit made at the request of the patient as she wanted to discontinue the IV antibiotics as she is having hard time tolerating it. She also has a history of treatment for active TB pneumonia several years ago while living in [place], [place].  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 She was started on IV imipenem, IV tigecycline and IV amikacin on 3/14 with PICC line placement. She was having difficulty tolerating IV antibiotics and hence, it seems IV imipenem was hold on 3/22 and she is on 2 IV antibiotics IV tigecycline and IV amikacin. She is planned to see thoracic surgery, Dr. Elenor Quinones at [facility] for lung resection after being on IV antibiotics for 2-3 months. She has also seen Dr Lorenda Cahill at [facility].   She tells me " she feels like she is about to die". She is having increasing SOB and fatigue. She says she did have these symptoms when she talked to Dr Bridget Hartshorn on 4/5 but says it has gotten progressively worse and she thinks it is all related to the antibiotics. She thinks this is more related  to her IV antibiotics more specifically related to Tigecycline. Denies fevers, chills and sweats. Denies nausea, vomiting, abdominal pain and diarrhea. She says she was supposed to get Omadacycline in place of Tigecycline but says her out of pocket cost was so high that she is not able to afford for it. She really wants to stop IV tigecycline as she cannot take it any more.  I have reviewed prior office notes from Dr Megan Salon and it seems she is supposed to follow up with Thoracic surgeon at [facility] for possible resection after being on M  abscessus therapy with IV antibiotics for at least 2-3 months. She is aware about this plan. I discussed with her that this is a very difficult infection to treat and she has 2 different mycobacterial infections in her lung which makes it more complicated. I explained to her that discontiuing her IV antibiotics might cause delay in her surgery and encouraged her to continue her tx as long as she can take it. She has made up her mind and wants to dc IV tigecycline for now and wants to talk to DR Cambell when he is back in clinic next week. I also encouraged her to come to the clinic in person if possible to which she said she is very weak and cannot come to the clinic. She feels so weak that she cannot even stand.   ROS 12 point ROS done with pertinent positives and negatives listed above   Past Medical History:  Diagnosis Date  . Hypertension     Social History   Tobacco Use  . Smoking status: Never Smoker  . Smokeless tobacco: Never Used  Vaping Use  . Vaping Use: Never used  Substance Use Topics  . Alcohol use: Never  . Drug use: Never    Family History  Problem Relation Age of Onset  . Cancer Mother   . Heart attack Father     No Known Allergies  Health Maintenance  Topic Date Due  . Hepatitis C Screening  Never done  . TETANUS/TDAP  Never done  . COLONOSCOPY (Pts 45-72yr Insurance coverage will need to be confirmed)  Never done  . MAMMOGRAM  Never done  . DEXA SCAN  Never done  . PNA vac Low Risk Adult (1 of 2 - PCV13) Never done  . COVID-19 Vaccine (2 - Booster for [employer]) 04/16/2020  . INFLUENZA VACCINE  06/26/2021  . HPV VACCINES  Aged Out    Observations/Objective:  Pertinent Microbiology Results for orders placed or performed in visit on 11/15/20  MYCOBACTERIA, CULTURE, WITH FLUOROCHROME SMEAR     Status: Abnormal   Collection Time: 11/15/20  3:31 PM   Specimen: Sputum  Result Value Ref Range Status   MICRO NUMBER: [id number] Final   SPECIMEN  QUALITY: Adequate  Final   Source: SPUTUM  Final   STATUS: FINAL  Final   SMEAR: (A)  Final    Rare (1 +) acid-fast bacilli seen using the fluorochrome method.   ISOLATE 1: mycobacterium, non-tuberculosis  (A)  Final    Comment: Mycobacterium species, not M.tuberculosis complex Isolate forwarded to [facility] for Identification. For susceptibility see REQUISITION NUMBER [id number]  CP Mycobacterial Identification     Status: None   Collection Time: 11/15/20  3:31 PM  Result Value Ref Range Status   Specimen Source (CAL[facility]) SPUTUM  Final   STATUS: FINAL  Final   AFB IDENTIFICATION MYCOBACTERIUM ABSCESSUS COMPLEX  Final    Comment: Identified by  MALDI-TOF    Pertinent Imagings FINDINGS: Cardiovascular: Aortic atherosclerosis. Normal heart size. No pericardial effusion.  Mediastinum/Nodes: Prominent mediastinal lymph nodes. thyroid gland, trachea, and esophagus demonstrate no significant findings.  Lungs/Pleura: Very extensive, clustered centrilobular nodularity throughout the lungs with areas of confluent nodularity and consolidation, for example in the left lung base and lingula. There are areas of fibrotic bronchiectasis and large cavitary lesions in the upper lungs, including a dominant cavitary lesion in the left pulmonary apex measuring at least 9 cm. No pleural effusion or pneumothorax.  Upper Abdomen: No acute abnormality.  Musculoskeletal: No chest wall mass or suspicious bone lesions identified. Bilateral breast implants.  IMPRESSION: 1. Very extensive, clustered centrilobular nodularity throughout the lungs with areas of confluent nodularity and consolidation, for example in the left lung base and lingula. There are areas of fibrotic bronchiectasis and large cavitary lesions in the upper lungs, including a dominant cavitary lesion in the left pulmonary apex measuring at least 9 cm. 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 2. Prominent mediastinal lymph nodes, likely reactive. 3.  Aortic Atherosclerosis (ICD10-I70.0).   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 The patient was provided an opportunity to ask questions and all were answered. The patient agreed with the plan and demonstrated an understanding of the instructions.   The patient was advised to call back or seek an in-person evaluation if the symptoms worsen or if the condition fails to improve as anticipated.  I provided 30 minutes of non-face-to-face time during this encounter.  Wilber Oliphant, [place] for [facility] [phone] pager   [phone] cell 03/08/2021, 2:17 PM

## 2021-03-11 ENCOUNTER — Emergency Department (HOSPITAL_COMMUNITY): Payer: 59

## 2021-03-11 ENCOUNTER — Inpatient Hospital Stay (HOSPITAL_COMMUNITY)
Admission: EM | Admit: 2021-03-11 | Discharge: 2021-03-20 | DRG: 191 | Disposition: A | Discharge status: Home / Self Care | Payer: 59 | Attending: Family Medicine | Admitting: Family Medicine

## 2021-03-11 ENCOUNTER — Other Ambulatory Visit: Payer: Self-pay

## 2021-03-11 ENCOUNTER — Encounter (HOSPITAL_COMMUNITY): Payer: Self-pay | Admitting: Emergency Medicine

## 2021-03-11 DIAGNOSIS — A318 Other mycobacterial infections: Secondary | ICD-10-CM

## 2021-03-11 DIAGNOSIS — Z79899 Other long term (current) drug therapy: Secondary | ICD-10-CM

## 2021-03-11 DIAGNOSIS — Z9882 Breast implant status: Secondary | ICD-10-CM

## 2021-03-11 DIAGNOSIS — J471 Bronchiectasis with (acute) exacerbation: Principal | ICD-10-CM | POA: Diagnosis present

## 2021-03-11 DIAGNOSIS — R0602 Shortness of breath: Secondary | ICD-10-CM | POA: Diagnosis not present

## 2021-03-11 DIAGNOSIS — Z8611 Personal history of tuberculosis: Secondary | ICD-10-CM

## 2021-03-11 DIAGNOSIS — Z8249 Family history of ischemic heart disease and other diseases of the circulatory system: Secondary | ICD-10-CM

## 2021-03-11 DIAGNOSIS — J479 Bronchiectasis, uncomplicated: Secondary | ICD-10-CM

## 2021-03-11 DIAGNOSIS — I10 Essential (primary) hypertension: Secondary | ICD-10-CM | POA: Diagnosis present

## 2021-03-11 DIAGNOSIS — Z66 Do not resuscitate: Secondary | ICD-10-CM | POA: Diagnosis present

## 2021-03-11 DIAGNOSIS — A310 Pulmonary mycobacterial infection: Secondary | ICD-10-CM

## 2021-03-11 DIAGNOSIS — E871 Hypo-osmolality and hyponatremia: Secondary | ICD-10-CM | POA: Diagnosis present

## 2021-03-11 DIAGNOSIS — E876 Hypokalemia: Secondary | ICD-10-CM | POA: Diagnosis present

## 2021-03-11 DIAGNOSIS — R739 Hyperglycemia, unspecified: Secondary | ICD-10-CM | POA: Diagnosis present

## 2021-03-11 DIAGNOSIS — A319 Mycobacterial infection, unspecified: Secondary | ICD-10-CM

## 2021-03-11 DIAGNOSIS — G44209 Tension-type headache, unspecified, not intractable: Secondary | ICD-10-CM | POA: Diagnosis present

## 2021-03-11 DIAGNOSIS — I70209 Unspecified atherosclerosis of native arteries of extremities, unspecified extremity: Secondary | ICD-10-CM

## 2021-03-11 DIAGNOSIS — I313 Pericardial effusion (noninflammatory): Secondary | ICD-10-CM | POA: Diagnosis present

## 2021-03-11 DIAGNOSIS — G629 Polyneuropathy, unspecified: Secondary | ICD-10-CM | POA: Diagnosis present

## 2021-03-11 DIAGNOSIS — Z20822 Contact with and (suspected) exposure to covid-19: Secondary | ICD-10-CM | POA: Diagnosis present

## 2021-03-11 HISTORY — DX: Bronchiectasis, uncomplicated: J47.9

## 2021-03-11 LAB — BASIC METABOLIC PANEL
Anion gap: 7 (ref 5–15)
BUN: 11 mg/dL (ref 8–23)
CO2: 26 mmol/L (ref 22–32)
Calcium: 8.8 mg/dL — ABNORMAL LOW (ref 8.9–10.3)
Chloride: 95 mmol/L — ABNORMAL LOW (ref 98–111)
Creatinine, Ser: 0.67 mg/dL (ref 0.44–1.00)
GFR, Estimated: >60 mL/min (ref >60)
Glucose, Bld: 121 mg/dL — ABNORMAL HIGH (ref 70–99)
Potassium: 3.4 mmol/L — ABNORMAL LOW (ref 3.5–5.1)
Sodium: 128 mmol/L — ABNORMAL LOW (ref 135–145)

## 2021-03-11 LAB — CBC
HCT: 41.9 % (ref 36.0–46.0)
Hemoglobin: 14.1 g/dL (ref 12.0–15.0)
MCH: 31.4 pg (ref 26.0–34.0)
MCHC: 33.7 g/dL (ref 30.0–36.0)
MCV: 93.3 fL (ref 80.0–100.0)
Platelets: 257 10*3/uL (ref 150–400)
RBC: 4.49 MIL/uL (ref 3.87–5.11)
RDW: 11.7 % (ref 11.5–15.5)
WBC: 6 10*3/uL (ref 4.0–10.5)
nRBC: 0 % (ref 0.0–0.2)

## 2021-03-11 LAB — RESP PANEL BY RT-PCR (FLU A&B, COVID) ARPGX2
Influenza A by PCR: NEGATIVE
Influenza B by PCR: NEGATIVE
SARS Coronavirus 2 by RT PCR: NEGATIVE

## 2021-03-11 LAB — TROPONIN I (HIGH SENSITIVITY)
Troponin I (High Sensitivity): 5 ng/L (ref <18)
Troponin I (High Sensitivity): 8 ng/L (ref <18)

## 2021-03-11 MED ORDER — ETHAMBUTOL HCL 400 MG PO TABS
1000.0000 mg | ORAL_TABLET | Freq: Every day | ORAL | Status: DC
  Administered 2021-03-12 – 2021-03-20 (×9): 1000 mg via ORAL
  Filled 2021-03-11 (×9): qty 2

## 2021-03-11 MED ORDER — POTASSIUM CHLORIDE CRYS ER 20 MEQ PO TBCR
40.0000 meq | EXTENDED_RELEASE_TABLET | Freq: Once | ORAL | Status: AC
  Administered 2021-03-11: 40 meq via ORAL
  Filled 2021-03-11: qty 2

## 2021-03-11 MED ORDER — AMIKACIN IV (FOR PTA / DISCHARGE USE ONLY): 1000.0000 mg | INTRAVENOUS | Status: DC

## 2021-03-11 MED ORDER — ENOXAPARIN SODIUM 40 MG/0.4ML ~~LOC~~ SOLN
40.0000 mg | SUBCUTANEOUS | Status: DC
  Administered 2021-03-11 – 2021-03-13 (×3): 40 mg via SUBCUTANEOUS
  Filled 2021-03-11 (×5): qty 0.4

## 2021-03-11 MED ORDER — AZITHROMYCIN 250 MG PO TABS
250.0000 mg | ORAL_TABLET | Freq: Every day | ORAL | Status: DC
  Administered 2021-03-12 – 2021-03-20 (×9): 250 mg via ORAL
  Filled 2021-03-11 (×9): qty 1

## 2021-03-11 MED ORDER — ACETAMINOPHEN 650 MG RE SUPP: 650.0000 mg | Freq: Four times a day (QID) | RECTAL | Status: DC | PRN

## 2021-03-11 MED ORDER — ALBUTEROL SULFATE HFA 108 (90 BASE) MCG/ACT IN AERS
1.0000 | INHALATION_SPRAY | RESPIRATORY_TRACT | Status: DC | PRN
  Administered 2021-03-12: 2 via RESPIRATORY_TRACT
  Filled 2021-03-11 (×2): qty 6.7

## 2021-03-11 MED ORDER — IOHEXOL 350 MG/ML SOLN
75.0000 mL | Freq: Once | INTRAVENOUS | Status: AC | PRN
  Administered 2021-03-11: 75 mL via INTRAVENOUS

## 2021-03-11 MED ORDER — ACETAMINOPHEN 325 MG PO TABS
650.0000 mg | ORAL_TABLET | Freq: Four times a day (QID) | ORAL | Status: DC | PRN
  Administered 2021-03-13: 325 mg via ORAL
  Filled 2021-03-11: qty 2

## 2021-03-11 MED ORDER — VITAMIN B-6 25 MG PO TABS
25.0000 mg | ORAL_TABLET | Freq: Every day | ORAL | Status: DC
  Administered 2021-03-12 – 2021-03-20 (×9): 25 mg via ORAL
  Filled 2021-03-11 (×9): qty 1

## 2021-03-11 MED ORDER — RIFAMPIN 300 MG PO CAPS
600.0000 mg | ORAL_CAPSULE | Freq: Every morning | ORAL | Status: DC
  Administered 2021-03-12 – 2021-03-20 (×9): 600 mg via ORAL
  Filled 2021-03-11 (×9): qty 2

## 2021-03-11 MED ORDER — POLYETHYLENE GLYCOL 3350 17 G PO PACK: 17.0000 g | PACK | Freq: Every day | ORAL | Status: DC | PRN

## 2021-03-11 NOTE — ED Provider Notes (Signed)
[facility] EMERGENCY DEPARTMENT Provider Note   CSN: [id number] Arrival date & time: 03/11/21  1409     History Chief Complaint  Patient presents with  . Shortness of Breath    Victoria Mendoza is a 65 y.o. female.  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  States that she has been undergoing IV antibiotic treatments for treatment of Mycobacterium abscessus.  She recently discontinued one of them that she has been feeling her tigecycline has been causing worsening of her shortness of breath.  Over the past 2 days she has been struggling to ambulate without feeling short of breath.  She denies fevers or chills, nausea vomiting syncope or chest pain at this time.  Past Medical History:  Diagnosis Date  . Hypertension     Patient Active Problem List   Diagnosis Date Noted  . Bronchiectasis without complication (HCC) 09/12/2020  . 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 History of tuberculosis 04/21/2020  . Hypertension 04/21/2020  . Atherosclerotic peripheral vascular disease (HCC) 04/21/2020    History reviewed. No pertinent surgical history.   OB History   No obstetric history on file.     Family History  Problem Relation Age of Onset  . Cancer Mother   . Heart attack Father     Social History   Tobacco Use  . Smoking status: Never Smoker  . Smokeless tobacco: Never Used  Vaping Use  . Vaping Use: Never used  Substance Use Topics  . Alcohol use: Never  . Drug use: Never    Home Medications Prior to Admission medications   Medication Sig Start Date End Date Taking? Authorizing Provider  amikacin (AMIKIN) IVPB Inject 1,000 mg into the vein 3 (three) times a week. Patient taking  differently: Inject 1,000 mg into the vein every Monday, Wednesday, and Friday. 02/01/21  Yes Cliffton Asters, MD  azithromycin (ZITHROMAX) 250 MG tablet Take 1 tablet (250 mg total) by mouth daily. 01/31/21  Yes Cliffton Asters, MD  azithromycin (ZITHROMAX) 500 MG tablet Take 500 mg by mouth daily. 02/15/21  Yes [provider]  B Complex Vitamins (VITAMIN B COMPLEX) TABS Take 1 tablet by mouth daily.   Yes [provider]  bisoprolol-hydrochlorothiazide (ZIAC) 2.5-6.25 MG tablet Take 1 tablet by mouth daily.   Yes [provider]  ethambutol (MYAMBUTOL) 400 MG tablet Take 2-1/2 tablets by mouth daily Patient taking differently: Take 800 mg by mouth every morning. 01/31/21  Yes Cliffton Asters, MD  Multiple Vitamin (MULTIVITAMIN WITH MINERALS) TABS tablet Take 1 tablet by mouth daily.   Yes [provider]  Omega-3 Fatty Acids (OMEGA 3 PO) Take 4 capsules by mouth daily.   Yes [provider]  polyvinyl alcohol (LIQUIFILM TEARS) 1.4 % ophthalmic solution Place 1 drop into both eyes daily as needed for dry eyes.   Yes [provider]  Pyridoxine HCl (VITAMIN B-6 PO) Take 1 tablet by mouth daily.   Yes [provider]  rifampin (RIFADIN) 300 MG capsule Take 2 capsules (600 mg total) by mouth daily. Patient taking differently: Take 600 mg by mouth every morning. 01/31/21  Yes Cliffton Asters, MD  tigecycline (TYGACIL) 50 MG injection Inject 50 mg into the vein daily. Patient taking differently: Inject 50 mg into the vein every evening. 01/31/21  Yes  Cliffton Asters, MD  Acetylcysteine 600 MG CAPS Take 1 capsule (600 mg total) by mouth 2 (two) times daily. Patient not taking: No sig reported 01/31/21   Cliffton Asters, MD  imipenem-cilastatin (PRIMAXIN) 500 MG injection Inject 1,000 mg into the vein every 12 (twelve) hours. Patient not taking: No sig reported 01/31/21   Cliffton Asters, MD  sodium chloride HYPERTONIC 3 % nebulizer solution Take by nebulization  every 8 (eight) hours. Patient not taking: No sig reported 09/12/20   Coral Ceo, NP  triamcinolone (KENALOG) 0.025 % ointment Apply 1 application topically 2 (two) times daily. Patient not taking: No sig reported 07/15/20   Eustace Moore, MD    Allergies    Patient has no known allergies.  Review of Systems   Review of Systems  Constitutional: Negative for chills and fever.  HENT: Negative for ear pain and sore throat.   Eyes: Negative for pain and visual disturbance.  Respiratory: Positive for shortness of breath. Negative for cough.   Cardiovascular: Negative for chest pain and palpitations.  Gastrointestinal: Negative for abdominal pain and vomiting.  Genitourinary: Negative for dysuria and hematuria.  Musculoskeletal: Negative for arthralgias and back pain.  Skin: Negative for color change and rash.  Neurological: Negative for seizures and syncope.  All other systems reviewed and are negative.   Physical Exam Updated Vital Signs BP 131/86   Pulse 98   Temp 97.7 F (36.5 C) (Oral)   Resp 20   SpO2 95%   Physical Exam Vitals and nursing note reviewed.  Constitutional:      General: She is not in acute distress.    Appearance: She is well-developed.  HENT:     Head: Normocephalic and atraumatic.  Eyes:     Conjunctiva/sclera: Conjunctivae normal.  Cardiovascular:     Rate and Rhythm: Normal rate and regular rhythm.     Heart sounds: No murmur heard.   Pulmonary:     Effort: Tachypnea and accessory muscle usage present. No respiratory distress.     Breath sounds: Normal breath sounds.  Abdominal:     General: There is no distension.     Palpations: Abdomen is soft.     Tenderness: There is no abdominal tenderness. There is no right CVA tenderness or left CVA tenderness.  Musculoskeletal:        General: No swelling or tenderness. Normal range of motion.     Cervical back: Neck supple.  Skin:    General: Skin is warm and dry.  Neurological:      General: No focal deficit present.     Mental Status: She is alert and oriented to person, place, and time. Mental status is at baseline.     Cranial Nerves: No cranial nerve deficit.     ED Results / Procedures / Treatments   Labs (all labs ordered are listed, but only abnormal results are displayed) Labs Reviewed  BASIC METABOLIC PANEL - Abnormal; Notable for the following components:      Result Value   Sodium 128 (*)    Potassium 3.4 (*)    Chloride 95 (*)    Glucose, Bld 121 (*)    Calcium 8.8 (*)    All other components within normal limits  CBC  TROPONIN I (HIGH SENSITIVITY)  TROPONIN I (HIGH SENSITIVITY)    EKG EKG Interpretation  Date/Time:  Saturday March 11 2021 14:30:28 EDT Ventricular Rate:  104 PR Interval:  252 QRS Duration: 130 QT Interval:  284 QTC Calculation:  373 R Axis:   88 Text Interpretation: Sinus tachycardia with 1st degree A-V block with Premature supraventricular complexes Biatrial enlargement Non-specific intra-ventricular conduction block Cannot rule out Anteroseptal infarct , age undetermined T wave abnormality, consider inferolateral ischemia No previous tracing Confirmed by Gwyneth SproutPlunkett, Whitney ([id number]) on 03/11/2021 5:32:52 PM   Radiology DG Chest 2 View  Result Date: 03/11/2021 CLINICAL DATA:  Shortness of breath, follow-up PICC line EXAM: CHEST - 2 VIEW COMPARISON:  03/14/2020 FINDINGS: Cardiac shadow is within normal limits. Aortic calcifications are again noted. Right-sided PICC line is noted with catheter tip in the mid superior vena cava. This is within normal limits. The lungs are well aerated bilaterally with biapical scarring and bullous changes. These changes are stable in appearance from the prior CT examination. Breast implants are noted. IMPRESSION: Chronic changes in the upper lobes bilaterally consistent with prior infection and scarring. No new focal abnormality is seen. PICC line in satisfactory position. Electronically Signed   By:  Alcide CleverMark  Lukens M.D.   On: 03/11/2021 15:26   CT Angio Chest PE W and/or Wo Contrast  Result Date: 03/11/2021 CLINICAL DATA:  Shortness of breath EXAM: CT ANGIOGRAPHY CHEST WITH CONTRAST TECHNIQUE: Multidetector CT imaging of the chest was performed using the standard protocol during bolus administration of intravenous contrast. Multiplanar CT image reconstructions and MIPs were obtained to evaluate the vascular anatomy. CONTRAST:  75mL OMNIPAQUE IOHEXOL 350 MG/ML SOLN COMPARISON:  04/15/2020 FINDINGS: Cardiovascular: No filling defects in the pulmonary arteries to suggest pulmonary emboli. Heart is normal size. Aorta is normal caliber. Coronary artery and aortic calcifications. Mediastinum/Nodes: No mediastinal, hilar, or axillary adenopathy. Trachea and esophagus are unremarkable. Thyroid unremarkable. Lungs/Pleura: Severe bullous emphysema, bronchiectasis and scarring throughout the lungs, most pronounced in the upper lung zones. Extensive nodular densities in the left lower lobe. Findings are stable since prior study. No effusions or acute infiltrates. Upper Abdomen: Imaging into the upper abdomen demonstrates no acute findings. Musculoskeletal: Bilateral breast implants noted. Otherwise chest wall soft tissues are unremarkable. No acute bony abnormality. Review of the MIP images confirms the above findings. IMPRESSION: Bullous disease or cavitary lesions in the upper lobes bilaterally, stable since prior study. Extensive bronchiectasis, scarring and nodular disease throughout both lungs. Findings most compatible with chronic atypical infection such as mycobacterium avium. No pulmonary embolus. Coronary artery disease. No acute cardiopulmonary disease. Aortic Atherosclerosis (ICD10-I70.0). Electronically Signed   By: Charlett NoseKevin  Dover M.D.   On: 03/11/2021 19:39    Procedures Procedures   Medications Ordered in ED Medications  iohexol (OMNIPAQUE) 350 MG/ML injection 75 mL (75 mLs Intravenous Contrast Given  03/11/21 1914)    ED Course  I have reviewed the triage vital signs and the nursing notes.  Pertinent labs & imaging results that were available during my care of the patient were reviewed by me and considered in my medical decision making (see chart for details).    MDM Rules/Calculators/A&P                          Patient is a 65 year old female present with chief complaint of shortness of breath.  Patient has an extensive history of Mycobacterium avium infection is undergoing multiple outpatient infusions to attempt to control his infections.  Unfortunately, patient is having progressive dyspnea on exertion over the past 2 weeks with rapid progression of the past 2 days.  On a phone call 2 days ago, patient was too weak to leave the bed and patient feels  that she cannot take care of herself at home because of how short of breath she has.  It is made her considering stopping the infusions but she is decided to continue forward with them.  She denies fevers or chills, nausea vomiting, syncope or chest pain at this time but is persistently short of breath.  Vital signs here initially with tachypnea and tachycardia that is stabilized with patient does not have to exert herself.  Otherwise unassuming physical exam. Discussed the case with her infectious disease specialist.  Proceed with CTA PE study evaluating for possible alternative etiology of her shortness of breath.  However CTA PE study, EKG, delta troponins, screening laboratory evaluation without any other acute abnormalities at this time it could explain patient's symptoms.  Cavitary lesions appear stable and no other findings on CT study.  Infectious disease team communicated with and agree that patient's dyspnea on exertion has been worsening in the outpatient setting and that is leading to medication noncompliance.  Feel patient would benefit from admission for strict symptomatic control based on this conversation.  This will allow ID team to  place patient on appropriate anabiotic's and ensure medication compliance.  Vital signs stable for floor admission.  Admitting team communicated with and accepted patient in admission.   Final Clinical Impression(s) / ED Diagnoses Final diagnoses:  None    Rx / DC Orders ED Discharge Orders    None       Glyn Ade, MD 03/11/21 [id number]    Gwyneth Sprout, MD 03/11/21 2240

## 2021-03-11 NOTE — Progress Notes (Signed)
Family Medicine Teaching Service Daily Progress Note Intern Pager: [phone]  Patient name: Victoria Mendoza Medical record number: [id number] Date of birth: [birth year] Age: 65 y.o. Gender: female  Primary Care Provider: Patient, No Pcp Per (Inactive) Consultants: ID Code Status: DNR  Pt Overview and Major Events to Date:  Admitted 4/16   Assessment and Plan: Amnah Breuer is a 65 y.o. female presenting with worsening shortness of breath. 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  Dyspnea on exertion, in setting of known MAC/M. Abscessus: Subacute, stable.  No hypoxia or respiratory difficulty following admit. Unclear etiology, felt to possibly 2/2 worsening/exaceberation of mycobacterium infection/bronchiectasis vs medication side effect (vs both).  - ID consulted in ED, hopefully will appreciate further recommendations today, 4/17  - Albuterol inhaler as needed, assess response - Incentive spirometry every hour - Oxygen if needed - Obtain echocardiogram to rule out contributing underlying cardiac abnormality  - Continue antibiotic regimen as follows:             - P.o. azithromycin 250 mg daily             - P.o. ethambutol 1000 mg daily             - p.o. rifampin 600 mg daily             - IV amikacin 1000 mg MWF             - Hold home tigecycline d/t patient refusal/intolerance  MAC and M. Abscessus: Chronic.  Following closely with ID and on antibiotic regimen as above.  She has now been on IV antibiotics for several months, awaiting evaluation by thoracic surgery at [facility] for lung resection. - Antibiotic regimen as above, awaiting ID recommendations - HIV/RPR pending   Hypertension: Chronic, stable. SBP average 100-130 since admit. Takes bisoprolol-HCTZ for the past several years. Reports that she has this medication sent from her sister in [place], prescribed by physician she saw  approximately 8 years ago. - Hold home bisoprolol-HCTZ, consider transition to alternative agent given electrolyte derangements as discussed below - Monitor BP  Hyponatremia: Chronic, improved. NA 128> 135 today, baseline around 129-134 since at least 2021. Could consider secondary to HCTZ vs chronic MAC.  - Hold HCTZ as discussed above - Monitor BMP  Hypokalemia: Acute, resolved. K 4.3 this morning.  - BMP  Tension headache: Resolved. No current concern, resolved after admit.  - K pad as needed  - Tylenol as needed  Hyperglycemia: Acute, improved. Fasting glucose 94 this am  - A1c pending   Bilateral peripheral neuropathy: Chronic. Likely side effect of antibiotic therapy for MAC/tuberculosis. Taking B6 chronically.  -- Check B12 level  - Consider further work-up outpatient/symptomatic therapy  Previous tuberculosis: Resolved, stable. Several years ago and most recently in 2018, fully treated.  FEN/GI: Regular diet Prophylaxis: Lovenox   Status is: Observation  The patient remains OBS appropriate and will d/c before 2 midnights.  Dispo: The patient is from: Home              Anticipated d/c is to: Home              Patient currently is not medically stable to d/c.   Difficult to place patient No   Subjective:  No acute events since admission overnight. Breathing comfortably at rest this morning. Denies any concerns at this time.   Objective: Temp:  [97.7 F (36.5 C)] 97.7 F (36.5  C) (04/16 1435) Pulse Rate:  [92-108] 92 (04/16 2300) Resp:  [17-31] 29 (04/16 2300) BP: (128-161)/(79-92) 128/82 (04/16 2300) SpO2:  [92 %-97 %] 96 % (04/16 2300) Physical Exam: General: Resting comfortably, NAD HEENT: NCAT Cardiac: RRR  Lungs: Clear bilaterally with occasional coarse breath sounds, no wheezing or rhonchi currently, no increased WOB on RA   Ext: Warm, dry, 2+ distal pulses, no edema b/l   Laboratory: Recent Labs  Lab 03/11/21 1438  WBC 6.0  HGB  14.1  HCT 41.9  PLT 257   Recent Labs  Lab 03/11/21 1438  NA 128*  K 3.4*  CL 95*  CO2 26  BUN 11  CREATININE 0.67  CALCIUM 8.8*  GLUCOSE 121*     Imaging/Diagnostic Tests: DG Chest 2 View  Result Date: 03/11/2021 CLINICAL DATA:  Shortness of breath, follow-up PICC line EXAM: CHEST - 2 VIEW COMPARISON:  03/14/2020 FINDINGS: Cardiac shadow is within normal limits. Aortic calcifications are again noted. Right-sided PICC line is noted with catheter tip in the mid superior vena cava. This is within normal limits. The lungs are well aerated bilaterally with biapical scarring and bullous changes. These changes are stable in appearance from the prior CT examination. Breast implants are noted. IMPRESSION: Chronic changes in the upper lobes bilaterally consistent with prior infection and scarring. No new focal abnormality is seen. PICC line in satisfactory position. Electronically Signed   By: Inez Catalina M.D.   On: 03/11/2021 15:26   CT Angio Chest PE W and/or Wo Contrast  Result Date: 03/11/2021 CLINICAL DATA:  Shortness of breath EXAM: CT ANGIOGRAPHY CHEST WITH CONTRAST TECHNIQUE: Multidetector CT imaging of the chest was performed using the standard protocol during bolus administration of intravenous contrast. Multiplanar CT image reconstructions and MIPs were obtained to evaluate the vascular anatomy. CONTRAST:  67m OMNIPAQUE IOHEXOL 350 MG/ML SOLN COMPARISON:  04/15/2020 FINDINGS: Cardiovascular: No filling defects in the pulmonary arteries to suggest pulmonary emboli. Heart is normal size. Aorta is normal caliber. Coronary artery and aortic calcifications. Mediastinum/Nodes: No mediastinal, hilar, or axillary adenopathy. Trachea and esophagus are unremarkable. Thyroid unremarkable. Lungs/Pleura: Severe bullous emphysema, bronchiectasis and scarring throughout the lungs, most pronounced in the upper lung zones. Extensive nodular densities in the left lower lobe. Findings are stable since  prior study. No effusions or acute infiltrates. Upper Abdomen: Imaging into the upper abdomen demonstrates no acute findings. Musculoskeletal: Bilateral breast implants noted. Otherwise chest wall soft tissues are unremarkable. No acute bony abnormality. Review of the MIP images confirms the above findings. IMPRESSION: Bullous disease or cavitary lesions in the upper lobes bilaterally, stable since prior study. Extensive bronchiectasis, scarring and nodular disease throughout both lungs. Findings most compatible with chronic atypical infection such as mycobacterium avium. No pulmonary embolus. Coronary artery disease. No acute cardiopulmonary disease. Aortic Atherosclerosis (ICD10-I70.0). Electronically Signed   By: KRolm BaptiseM.D.   On: 03/11/2021 19:39    BPatriciaann Clan DO 03/11/2021, 11:49 PM PGY-3, [place] pager: [phone] text pages welcome

## 2021-03-11 NOTE — H&P (Signed)
[facility] Admission History and Physical Service Pager: [phone]  Patient name: Victoria Mendoza Medical record number: [id number] Date of birth: [birth year] Age: 65 y.o. Gender: female  Primary Care Provider: Patient, No Pcp Per (Inactive) Consultants: ID  Code Status: DNR  Preferred Emergency Contact: Addalynne Golding, sister, [phone]  Chief Complaint: SOB  Assessment and Plan: Eria Lozoya is a 65 y.o. female presenting with worsening shortness of breath. 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  Dyspnea on exertion, in setting of known MAC/M. Abscessus: Subacute, Worsening.  Pt presents with progressive DOE for the past several weeks, with subsequent worsening over the past several days, associated with yellow sputum (not increased in production or purulence).  Afebrile and breathing comfortably at rest, diffuse expiratory rhonchi/occasional wheeze on exam.  No hypoxia or leukocytosis.  Unclear etiology and may be multifactorial.  Currently follows with ID with regimen including p.o. azithromycin, ethambutol, rifampin, and IV amikacin/IV tigecycline, however patient recently self-discontinued tigecycline d/t concern it was causing her SOB.  Could consider medication side effect as contributing vs worsening/exacerbation of her Mycobacterium infection/bronchiectasis, however reassured by CT showing stable cavitary lesions.  No PE or PNA on CTA.  COVID/flu negative.  Troponin negative at 5>8 without ischemic EKG changes (some non-specific T wave changes), doubt underlying MI/angina. As she otherwise is hemodynamically stable, will hold off on antibiotic initiation/transition until evaluated by ID. - Admit to medical telemetry, attending Dr. Gwendlyn Deutscher - ID consulted in ED, will discuss with team further on 4/17 - Albuterol inhaler as needed, assess response - Incentive spirometry every  hour - Oxygen if needed - Can consider echocardiogram if not improving - Continue antibiotic regimen as follows:  - P.o. azithromycin 250 mg daily  - P.o. ethambutol 1000 mg daily  - p.o. rifampin 600 mg daily  - IV amikacin 1000 mg MWF  - Hold home tigecycline d/t patient refusal/intolerance  MAC and M. Abscessus: Chronic.  Following closely with ID and on antibiotic regimen as above.  She has now been on IV antibiotics for several months, awaiting evaluation by thoracic surgery at [facility] for lung resection. - Antibiotic regimen as above, awaiting ID recommendations - HIV antibody  Hypertension: Chronic, stable. SBP average 130s since arrival, similar at home.  Takes bisoprolol-HCTZ for the past several years. Reports that she has this medication sent from her sister in [place], prescribed by physician she saw approximately 8 years ago. - Hold home bisoprolol-HCTZ, consider transition to alternative agent given electrolyte derangements as discussed below - Monitor BP  Hyponatremia: Chronic. NA 128, baseline around 129-134 since at least 2021.  Euvolemic. Could consider secondary to HCTZ vs chronic MAC.  Asymptomatic. - Hold HCTZ as discussed above - Monitor BMP  Hypokalemia: Acute. K 3.4.  Likely due to current medications as above. - Give K-Dur 40 meq once - Recheck BMP in a.m.  Tension headache: Acute. Reports tension in her upper shoulders and neck after prolonged laying in her bed and in ED.  Neurologically intact. - K pad - Tylenol as needed  Hyperglycemia: Acute. Glucose 121, semifasting. - Check A1c  Bilateral peripheral neuropathy: Chronic. Likely side effect of antibiotic therapy for MAC/tuberculosis. - Consider further work-up outpatient/symptomatic therapy  Previous tuberculosis: Resolved, stable. Several years ago and most recently in 2018, fully treated.  FEN/GI: Regular diet Prophylaxis: Lovenox  Disposition: Admit to medical telemetry, attending Dr.  Gwendlyn Deutscher  History of Present Illness:  Victoria Mendoza is a 65  y.o. female with a history of MAC and lung abscesses following with ID (diagnosed 2021) presenting with worsening shortness of breath.   She has had worsening dyspnea on exertion over the past several weeks, however insidiously became worse over the past 4-5 days.  Reports chronic productive cough, however noticed sputum transitioning to yellow color over the past 2 days.  No increase in frequency or severity of cough or sputum production.  Her shortness of breath has progressed to the point she is no longer able to do her ADLs at home, states she stayed in bed for the past few days due to generalized weakness associated with shortness of breath.  Denies any fever, leg swelling, chest pain, or palpitations.  Breathing is overall okay at rest per patient.  No known sick contacts that she is aware of.  Stays at home by herself, has not worked since January.  Lifelong non-smoker.  Endorses chronic paresthesias of bilateral hands and feet.  She has been following very closely infectious disease, most recently seen via video visit on 4/13, discussed her worsening dyspnea on exertion at that time.  No antibiotic changes were made, however patient has self discontinued tigecycline due to concern that this is the etiology of her shortness of breath.  Additional therapy currently including p.o. azithromycin, p.o. ethambutol, p.o. rifampin, and IV amikacin.  Originally from [place], [place].  ED course: On arrival she was slightly tachypneic and tachycardic, improved with rest.  No hypoxia noted.  CTA chest performed showing no PE and bolus disease/cavitary lesions in the upper lobes bilaterally that are stable since previous study.  Additionally noted extensive bronchiectasis with scarring and nodular disease throughout.  ED provider discussed with infectious disease, who recommended admission for stability and transition of antibiotic regimen to  improve medication compliance, did not recommend any antibiotic changes yet.  EKG showing sinus tachycardia with first-degree AV block and T wave abnormalities.  Troponin 5 then 8.  COVID and influenza negative.  NA 128, potassium 3.4, chloride 95, glucose 121, WBC 6, hemoglobin 14.1.   Review Of Systems: Per HPI with the following additions:   Review of Systems  Constitutional: Positive for activity change. Negative for appetite change, fatigue and fever.  Respiratory: Positive for cough and shortness of breath. Negative for apnea, chest tightness, wheezing and stridor.   Cardiovascular: Negative for chest pain, palpitations and leg swelling.  Gastrointestinal: Negative for abdominal pain.  Musculoskeletal: Negative for gait problem.  Skin: Negative for rash and wound.  Neurological: Positive for headaches. Negative for dizziness, syncope, weakness, light-headedness and numbness.     Patient Active Problem List   Diagnosis Date Noted  . Bronchiectasis without complication ([place]) 74/25/9563  . 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 Mycobacterium avium infection ([place]) 04/21/2020  . History of tuberculosis 04/21/2020  . Hypertension 04/21/2020  . Atherosclerotic peripheral vascular disease ([place]) 04/21/2020    Past Medical History: Past Medical History:  Diagnosis Date  . Hypertension     Past Surgical History: History reviewed. No pertinent surgical history.  Social History: Social History   Tobacco Use  . Smoking status: Never Smoker  . Smokeless tobacco: Never Used  Vaping Use  . Vaping Use: Never used  Substance Use Topics  . Alcohol use: Never  . Drug use: Never   Additional social history: Lives alone, no previous tobacco use.  Minimal alcohol use, reports she is to have a glass of wine daily however quit 1 year ago. Please also refer to relevant sections  of EMR.  Family History: Family History  Problem Relation Age of Onset  . Cancer Mother   . Heart  attack Father    Allergies and Medications: No Known Allergies No current facility-administered medications on file prior to encounter.   Current Outpatient Medications on File Prior to Encounter  Medication Sig Dispense Refill  . amikacin (AMIKIN) IVPB Inject 1,000 mg into the vein 3 (three) times a week. (Patient taking differently: Inject 1,000 mg into the vein every Monday, Wednesday, and Friday.)    . bisoprolol-hydrochlorothiazide (ZIAC) 2.5-6.25 MG tablet Take 1 tablet by mouth daily.    . polyvinyl alcohol (ARTIFICIAL TEARS) 1.4 % ophthalmic solution Place 1 drop into both eyes daily as needed for dry eyes.    . tigecycline (TYGACIL) 50 MG injection Inject 50 mg into the vein daily. (Patient taking differently: Inject 50 mg into the vein every evening.) 1 each   . Acetylcysteine 600 MG CAPS Take 1 capsule (600 mg total) by mouth 2 (two) times daily. (Patient not taking: No sig reported) 60 capsule 5  . azithromycin (ZITHROMAX) 250 MG tablet Take 1 tablet (250 mg total) by mouth daily. 30 tablet 5  . azithromycin (ZITHROMAX) 500 MG tablet Take 500 mg by mouth daily.    Marland Kitchen ethambutol (MYAMBUTOL) 400 MG tablet Take 2-1/2 tablets by mouth daily (Patient not taking: Reported on 03/08/2021) 75 tablet 5  . imipenem-cilastatin (PRIMAXIN) 500 MG injection Inject 1,000 mg into the vein every 12 (twelve) hours. (Patient not taking: No sig reported) 1 each   . rifampin (RIFADIN) 300 MG capsule Take 2 capsules (600 mg total) by mouth daily. (Patient not taking: Reported on 03/08/2021) 60 capsule 5  . sodium chloride HYPERTONIC 3 % nebulizer solution Take by nebulization every 8 (eight) hours. (Patient not taking: No sig reported) 750 mL 12  . triamcinolone (KENALOG) 0.025 % ointment Apply 1 application topically 2 (two) times daily. (Patient not taking: No sig reported) 30 g 0    Objective: BP 140/86   Pulse 97   Temp 97.7 F (36.5 C) (Oral)   Resp 19   SpO2 95%  Exam: General: Alert, NAD   Eyes: EOMI ENTM: Mucous membranes moist Neck: Supple Cardiovascular: Tachycardic, regular rhythm, no murmurs appreciated Respiratory: Normal work of breathing at rest and speaking in full sentences without difficulty.  Diffuse expiratory rhonchi/coarse breath sounds throughout, occasional faint wheeze at end of expiration.  Satting appropriately on room air. Gastrointestinal: Soft, nondistended Extremities: Warm, dry.  Palpable dorsalis pedis pulses bilaterally.  No lower extremity edema.  Tense paracervical and trapezius musculature bilaterally, full cervical ROM without tenderness to palpation of cervical spinous processes. Derm: No rashes or bruising seen. Neuro: Alert and oriented.  Smile symmetrical.  Speech easily understandable.  Able to move all extremities spontaneously.  Sensation to light touch intact throughout. Psych: Appropriate mood and affect, good eye contact, able to follow normal conversation  Labs and Imaging: CBC BMET  Recent Labs  Lab 03/11/21 1438  WBC 6.0  HGB 14.1  HCT 41.9  PLT 257   Recent Labs  Lab 03/11/21 1438  NA 128*  K 3.4*  CL 95*  CO2 26  BUN 11  CREATININE 0.67  GLUCOSE 121*  CALCIUM 8.8*     EKG: My own interpretation (not copied from electronic read) sinus tachycardia with first-degree AV block, occasional PVCs, T wave abnormality  DG Chest 2 View  Result Date: 03/11/2021 CLINICAL DATA:  Shortness of breath, follow-up PICC line EXAM:  CHEST - 2 VIEW COMPARISON:  03/14/2020 FINDINGS: Cardiac shadow is within normal limits. Aortic calcifications are again noted. Right-sided PICC line is noted with catheter tip in the mid superior vena cava. This is within normal limits. The lungs are well aerated bilaterally with biapical scarring and bullous changes. These changes are stable in appearance from the prior CT examination. Breast implants are noted. IMPRESSION: Chronic changes in the upper lobes bilaterally consistent with prior infection and  scarring. No new focal abnormality is seen. PICC line in satisfactory position. Electronically Signed   By: Inez Catalina M.D.   On: 03/11/2021 15:26   CT Angio Chest PE W and/or Wo Contrast  Result Date: 03/11/2021 CLINICAL DATA:  Shortness of breath EXAM: CT ANGIOGRAPHY CHEST WITH CONTRAST TECHNIQUE: Multidetector CT imaging of the chest was performed using the standard protocol during bolus administration of intravenous contrast. Multiplanar CT image reconstructions and MIPs were obtained to evaluate the vascular anatomy. CONTRAST:  33m OMNIPAQUE IOHEXOL 350 MG/ML SOLN COMPARISON:  04/15/2020 FINDINGS: Cardiovascular: No filling defects in the pulmonary arteries to suggest pulmonary emboli. Heart is normal size. Aorta is normal caliber. Coronary artery and aortic calcifications. Mediastinum/Nodes: No mediastinal, hilar, or axillary adenopathy. Trachea and esophagus are unremarkable. Thyroid unremarkable. Lungs/Pleura: Severe bullous emphysema, bronchiectasis and scarring throughout the lungs, most pronounced in the upper lung zones. Extensive nodular densities in the left lower lobe. Findings are stable since prior study. No effusions or acute infiltrates. Upper Abdomen: Imaging into the upper abdomen demonstrates no acute findings. Musculoskeletal: Bilateral breast implants noted. Otherwise chest wall soft tissues are unremarkable. No acute bony abnormality. Review of the MIP images confirms the above findings. IMPRESSION: Bullous disease or cavitary lesions in the upper lobes bilaterally, stable since prior study. Extensive bronchiectasis, scarring and nodular disease throughout both lungs. Findings most compatible with chronic atypical infection such as mycobacterium avium. No pulmonary embolus. Coronary artery disease. No acute cardiopulmonary disease. Aortic Atherosclerosis (ICD10-I70.0). Electronically Signed   By: KRolm BaptiseM.D.   On: 03/11/2021 19:39    BPatriciaann Clan DO 03/11/2021, 8:28  PM PGY-3 [place] pager: [phone] text pages welcome

## 2021-03-11 NOTE — ED Triage Notes (Signed)
Pt reports SOB and "lung pain" x 2-3 days.   Pt has a PICC line and is receiving antibiotics for a "lung infection".

## 2021-03-11 NOTE — ED Notes (Signed)
Patient ambulated to [facility] independently with steady gait.

## 2021-03-11 NOTE — ED Triage Notes (Signed)
Emergency Medicine Provider Triage Evaluation Note  Victoria Mendoza , a 65 y.o. female  was evaluated in triage.  Pt complains of SOB for months, worsening today. Being treated for Myocbacterium access, follows ID. Has picc line. No chest pain, no Fevers.    Review of Systems  Positive: Sob, wheezing Negative: Fevers, cp  Physical Exam  BP (!) 161/82 (BP Location: Left Arm)   Pulse (!) 108   Temp 97.7 F (36.5 C) (Oral)   Resp (!) 24   SpO2 97%  Gen:   Awake, no distress   HEENT:  Atraumatic  Resp:  Normal effort, wheezing throughout all lung fields. Slight tacypniea, no accessory muscle use.  Cardiac:  Normal rate  Abd:   Nondistended, nontender  MSK:   Moves extremities without difficulty  Neuro:  Speech clear   Medical Decision Making  Medically screening exam initiated at 2:49 PM.  Appropriate orders placed.  Kem Parcher was informed that the remainder of the evaluation will be completed by another provider, this initial triage assessment does not replace that evaluation, and the importance of remaining in the ED until their evaluation is complete.  Clinical Impression  MSE was initiated and I personally evaluated the patient and placed orders (if any) at  2:51 PM on March 11, 2021.  The patient appears stable so that the remainder of the MSE may be completed by another provider.    Farrel Gordon, PA-C 03/11/21 1454

## 2021-03-12 ENCOUNTER — Observation Stay (HOSPITAL_BASED_OUTPATIENT_CLINIC_OR_DEPARTMENT_OTHER): Payer: 59

## 2021-03-12 DIAGNOSIS — R0602 Shortness of breath: Secondary | ICD-10-CM | POA: Diagnosis not present

## 2021-03-12 DIAGNOSIS — A310 Pulmonary mycobacterial infection: Secondary | ICD-10-CM | POA: Diagnosis not present

## 2021-03-12 DIAGNOSIS — I339 Acute and subacute endocarditis, unspecified: Secondary | ICD-10-CM | POA: Diagnosis not present

## 2021-03-12 DIAGNOSIS — J852 Abscess of lung without pneumonia: Secondary | ICD-10-CM

## 2021-03-12 DIAGNOSIS — R0609 Other forms of dyspnea: Secondary | ICD-10-CM | POA: Diagnosis not present

## 2021-03-12 LAB — COMPREHENSIVE METABOLIC PANEL
ALT: 22 U/L (ref 0–44)
AST: 24 U/L (ref 15–41)
Albumin: 2.9 g/dL — ABNORMAL LOW (ref 3.5–5.0)
Alkaline Phosphatase: 69 U/L (ref 38–126)
Anion gap: 8 (ref 5–15)
BUN: 7 mg/dL — ABNORMAL LOW (ref 8–23)
CO2: 28 mmol/L (ref 22–32)
Calcium: 9.1 mg/dL (ref 8.9–10.3)
Chloride: 99 mmol/L (ref 98–111)
Creatinine, Ser: 0.71 mg/dL (ref 0.44–1.00)
GFR, Estimated: >60 mL/min (ref >60)
Glucose, Bld: 94 mg/dL (ref 70–99)
Potassium: 4.3 mmol/L (ref 3.5–5.1)
Sodium: 135 mmol/L (ref 135–145)
Total Bilirubin: 0.6 mg/dL (ref 0.3–1.2)
Total Protein: 6.6 g/dL (ref 6.5–8.1)

## 2021-03-12 LAB — VITAMIN B12: Vitamin B-12: 840 pg/mL (ref 180–914)

## 2021-03-12 LAB — CBC
HCT: 38.7 % (ref 36.0–46.0)
Hemoglobin: 13.3 g/dL (ref 12.0–15.0)
MCH: 30.9 pg (ref 26.0–34.0)
MCHC: 34.4 g/dL (ref 30.0–36.0)
MCV: 90 fL (ref 80.0–100.0)
Platelets: 241 10*3/uL (ref 150–400)
RBC: 4.3 MIL/uL (ref 3.87–5.11)
RDW: 11.9 % (ref 11.5–15.5)
WBC: 6.6 10*3/uL (ref 4.0–10.5)
nRBC: 0 % (ref 0.0–0.2)

## 2021-03-12 LAB — ECHOCARDIOGRAM COMPLETE
Area-P 1/2: 5.79 cm2
S' Lateral: 1.9 cm
Weight: 1862.45 oz

## 2021-03-12 LAB — HIV ANTIBODY (ROUTINE TESTING W REFLEX): HIV Screen 4th Generation wRfx: NONREACTIVE

## 2021-03-12 MED ORDER — TIGECYCLINE 50 MG IV SOLR
50.0000 mg | Freq: Every day | INTRAVENOUS | Status: DC
  Administered 2021-03-12 – 2021-03-20 (×9): 50 mg via INTRAVENOUS
  Filled 2021-03-12 (×9): qty 50

## 2021-03-12 MED ORDER — CHLORHEXIDINE GLUCONATE CLOTH 2 % EX PADS
6.0000 | MEDICATED_PAD | Freq: Every day | CUTANEOUS | Status: DC
  Administered 2021-03-12 – 2021-03-20 (×9): 6 via TOPICAL

## 2021-03-12 MED ORDER — DEXTROSE 5 % IV SOLN
1000.0000 mg | INTRAVENOUS | Status: DC
  Administered 2021-03-13 – 2021-03-17 (×3): 1000 mg via INTRAVENOUS
  Filled 2021-03-12 (×4): qty 4

## 2021-03-12 MED ORDER — ALBUTEROL SULFATE (2.5 MG/3ML) 0.083% IN NEBU
2.5000 mg | INHALATION_SOLUTION | RESPIRATORY_TRACT | Status: DC | PRN
  Administered 2021-03-18 – 2021-03-20 (×4): 2.5 mg via RESPIRATORY_TRACT
  Filled 2021-03-12 (×6): qty 3

## 2021-03-12 NOTE — Progress Notes (Signed)
03/12/2021 Patient was placed on airborne precaution on arrival to the unit last night for hx of TB. RN spoke to Dr Odette Fraction, infectious disease at 1525, concerning whether patient need to be on precaution. Per MD patient do not need to be on airborne precaution. Lovie Macadamia RN

## 2021-03-12 NOTE — Progress Notes (Signed)
*  PRELIMINARY RESULTS* Echocardiogram 2D Echocardiogram has been performed.  Stacey Drain 03/12/2021, 4:06 PM

## 2021-03-12 NOTE — Plan of Care (Signed)
  Problem: Education: Goal: Knowledge of General Education information will improve Description: Including pain rating scale, medication(s)/side effects and non-pharmacologic comfort measures 03/12/2021 1946 by Elray Mcgregor, LPN Outcome: Progressing 03/12/2021 0605 by Elray Mcgregor, LPN Outcome: Progressing   Problem: Health Behavior/Discharge Planning: Goal: Ability to manage health-related needs will improve Outcome: Progressing   Problem: Clinical Measurements: Goal: Ability to maintain clinical measurements within normal limits will improve Outcome: Progressing Goal: Will remain free from infection Outcome: Progressing Goal: Diagnostic test results will improve Outcome: Progressing Goal: Respiratory complications will improve Outcome: Progressing Goal: Cardiovascular complication will be avoided Outcome: Progressing   Problem: Activity: Goal: Risk for activity intolerance will decrease Outcome: Progressing   Problem: Nutrition: Goal: Adequate nutrition will be maintained 03/12/2021 1946 by Elray Mcgregor, LPN Outcome: Progressing 03/12/2021 0605 by Elray Mcgregor, LPN Outcome: Progressing   Problem: Coping: Goal: Level of anxiety will decrease Outcome: Progressing   Problem: Elimination: Goal: Will not experience complications related to bowel motility Outcome: Progressing Goal: Will not experience complications related to urinary retention Outcome: Progressing   Problem: Pain Managment: Goal: General experience of comfort will improve 03/12/2021 1946 by Elray Mcgregor, LPN Outcome: Progressing 03/12/2021 0605 by Elray Mcgregor, LPN Outcome: Progressing   Problem: Safety: Goal: Ability to remain free from injury will improve 03/12/2021 1946 by Elray Mcgregor, LPN Outcome: Progressing 03/12/2021 0605 by Elray Mcgregor, LPN Outcome: Progressing   Problem: Skin Integrity: Goal: Risk for impaired skin integrity will  decrease 03/12/2021 1946 by Elray Mcgregor, LPN Outcome: Progressing 03/12/2021 0605 by Elray Mcgregor, LPN Outcome: Progressing

## 2021-03-12 NOTE — Plan of Care (Signed)

## 2021-03-12 NOTE — Progress Notes (Signed)
FPTS Interim Progress Note  S:Received page. Contacted RN who reports that patient continues to report SOB. Reported to bedside and patient sitting upright on bed in NAD without signs of respiratory distress. She states that she feels sob with standing and walking. She states that it lessens with sitting and resting.   O: BP 121/77 (BP Location: Left Arm)   Pulse (!) 106   Temp 98.2 F (36.8 C) (Oral)   Resp 19   Wt 52.8 kg   SpO2 95%   BMI 21.99 kg/m   General: female appearing stated age in no acute distress, sitting crossed leg on bed  Cardio:tachycardiac  Pulm:saturaing 93% on RA, no increased WOB, diffuse and bilateral wheezing both inspiratory and expiratory  Extremities: No peripheral edema.   A/P: - ordered PRN nebulizer treatments of albuterol every 4 hours  - supplemental oxygen with satuation goal >95%, 1-2 L via [place]   Simmons-Robinson, Tawanna Cooler, MD 03/12/2021, 5:23 PM PGY-2, [facility] pager [phone]

## 2021-03-12 NOTE — Consult Note (Signed)
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 Imipenem was on hold                       Lines/Tubes: PICC line   Assessment Exertional Dyspnea- saturating 94-99% on room air  Patient definitely think her SOB is more related to the IV antibiotics, more specifically related to IV tigecycline. She says she stopped IV tigecycline last Wednesday but resumed on Friday. I would have expected nausea/vomiting as a side effect more commonly with Tigecycline than worsening SOB. Her SOB seems to be related to dual pulmonary infection with MAC and M abscessus than related to IV tigegcycline. Her Chest xray and CT chest seems to be stable however. She has been saturating 94-99% in room air and is able to speak full sentences without any difficulty. I also think there is a component of anxiety causing her SOB besides the pulmonary infection. She is supposed to see a thoracic surgeon at [facility] in the near future for possible partial lung resection after being on IV antibiotics for approx 2 months on review of prior notes. She is willing to continue the current regimen along with IV tigecycline. I  have discussed with her interruption in tx with IV antibiotics will further delay her surgery and it is important to continue the antibiotics and will monitor her for any side effects.   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 LFTs WNL. Cr stable.   Recommendations  Continue current regimen ( Azithromycin, Ethambutol, Rifampin, IV amikacin) Add IV tigecycline for now. She agreed to restarting it. Will also have our ID pharmacist look into other options like Omadacycline if that is affordable for outpatient.  Follow-up TTE results - she does not seem to be volume overloaded on exam and low suspicion for SOB to be related to be cardiac Monitor BMP, LFTs Monitor for ototoxicity/nephrotoxicity on IV amikacin  New ID team will assume care from tomorrow.   Rest of the management as per the primary team. Please call with questions or concerns.  Thank you for the consult ____________________________________________________________________________________________________ HPI and Hospital Course: Victoria Mendoza is a 65 year old female with PMH of HTN  who is being followed by my partner Dr Megan Salon for Pulmonary MAC and M abscessus. She was supposed to be on therapy with PO azithromycin, PO ethambutol and PO Rifampin along with IV tigecycline and IV amikacin as recommended by DR Megan Salon.  She also has a history of treatment for active TB pneumonia several years ago while living in her [place], [place]. 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 She was started on IV imipenem, IV tigecycline and IV amikacin on 3/14 with PICC line placement. She has been having tolerating IV antibiotics and hence, it seems IV imipenem was hold on 3/22 and she is on 2 IV antibiotics IV tigecycline and IV amikacin. She is planned to see thoracic surgery, Dr. Elenor Quinones at [facility] for lung resection after  being on IV antibiotics for 2-3 months. She has also seen Dr Lorenda Cahill at [facility]   I had a urgent phone visit with her last Wednesday when she wanted to stop IV antibiotics as she thought her SOB was related to the IV antibiotics. She self discontinued IV tigecycline by herself on Wednesday. She however resumed IV tigecycline on Friday. She also complained of numbness in her lower extremities on Wednesday. Please refer to my progress note on 03/08/21.   She presented to the ED on 4/16 with worsening SOB. She tells me that she was able to walk 1-2 miles in a tread mill a month ago, taking shorts breaks every quarter of a mile but she is unable to do so due to increasing SOB. She is hardly able to go to the restroom, barely cook for herself. She has to take breaks multiple times even doing minor house hold chores. Cough is chronic, dry and has not changed. Denies any fevers, chills and sweats. Denies nausea, vomiting, abdominal pain and diarrhea. Denies any GU symptoms. Denies rashes and joint pain. Denies any headache, blurry vision or ear pain. Denies any changes in appetite or weight. She had some numbness in her Lower extremities last week but says its gone and she feels her legs are now stiff.   At ED, she was afebrile, saturating 94-99% in RA. Labs unremarkable. Chest xray and CT chest stable findings.   ROS: General- Denies fever, chills, loss of appetite and loss of weight HEENT - Denies headache, blurry vision, neck pain, sinus pain Chest - SOB +, CHRONIC COUGH +. Denies any chest pain CVS- Denies any dizziness/lightheadedness, syncopal attacks, palpitations Abdomen- Denies any nausea, vomiting, abdominal pain, hematochezia and diarrhea Neuro - Denies any weakness, numbness, tingling sensation Psych - Denies any changes in mood irritability or depressive symptoms GU- Denies any burning, dysuria, hematuria or increased frequency of urination Skin - denies any rashes/lesions MSK - denies any joint  pain/swelling or restricted ROM   Past Medical History:  Diagnosis Date  . Hypertension    History reviewed. No pertinent surgical history.   Scheduled Meds: . azithromycin  250 mg Oral Daily  . Chlorhexidine Gluconate Cloth  6 each Topical Daily  . enoxaparin (LOVENOX) injection  40 mg Subcutaneous Q24H  . ethambutol  1,000 mg Oral Daily  . rifampin  600 mg Oral q morning  . vitamin  B-6  25 mg Oral Daily   Continuous Infusions: . [START ON 03/13/2021] amikacin (AMIKIN) IV     PRN Meds:.acetaminophen **OR** acetaminophen, albuterol, polyethylene glycol  No Known Allergies  Social History   Socioeconomic History  . Marital status: Single    Spouse name: Not on file  . Number of children: Not on file  . Years of education: Not on file  . Highest education level: Not on file  Occupational History  . Not on file  Tobacco Use  . Smoking status: Never Smoker  . Smokeless tobacco: Never Used  Vaping Use  . Vaping Use: Never used  Substance and Sexual Activity  . Alcohol use: Never  . Drug use: Never  . Sexual activity: Not on file  Other Topics Concern  . Not on file  Social History Narrative  . Not on file   Social Determinants of Health   Financial Resource Strain: Not on file  Food Insecurity: Not on file  Transportation Needs: Not on file  Physical Activity: Not on file  Stress: Not on file  Social Connections: Not on file  Intimate Partner Violence: Not on file   Family history - patient does not know  Vitals  BP 136/76 (BP Location: Left Arm)   Pulse 98   Temp 98.1 F (36.7 C) (Oral)   Resp (!) 24   Wt 52.8 kg   SpO2 100%   BMI 21.99 kg/m    Physical Exam Constitutional:  lying in bed, not in acute distress     Comments:   Cardiovascular:     Rate and Rhythm: Normal rate and regular rhythm.     Heart sounds: No murmur heard.   Pulmonary:     Effort: Pulmonary effort is normal.     Comments: clear air sounds bilaterally, occasional coarse  sounds   Abdominal:     Palpations: Abdomen is soft.     Tenderness: Non tender, BS+  Musculoskeletal:        General: No swelling or tenderness.   Skin:    Comments: No obvious lesions or rashes   Neurological:     General: No focal deficit present.   Psychiatric:        Mood and Affect: Mood normal.   Pertinent Microbiology Results for orders placed or performed during the hospital encounter of 03/11/21  Resp Panel by RT-PCR (Flu A&B, Covid) Nasopharyngeal Swab     Status: None   Collection Time: 03/11/21  9:15 PM   Specimen: Nasopharyngeal Swab; Nasopharyngeal(NP) swabs in vial transport medium  Result Value Ref Range Status   SARS Coronavirus 2 by RT PCR NEGATIVE NEGATIVE Final    Comment: (NOTE) SARS-CoV-2 target nucleic acids are NOT DETECTED.  The SARS-CoV-2 RNA is generally detectable in upper respiratory specimens during the acute phase of infection. The lowest concentration of SARS-CoV-2 viral copies this assay can detect is 138 copies/mL. A negative result does not preclude SARS-Cov-2 infection and should not be used as the sole basis for treatment or other patient management decisions. A negative result may occur with  improper specimen collection/handling, submission of specimen other than nasopharyngeal swab, presence of viral mutation(s) within the areas targeted by this assay, and inadequate number of viral copies(<138 copies/mL). A negative result must be combined with clinical observations, patient history, and epidemiological information. The expected result is Negative.  Fact Sheet for Patients:  EntrepreneurPulse.com.au  Fact Sheet for Healthcare Providers:  IncredibleEmployment.be  This test is no t yet approved or  cleared by the [place] and  has been authorized for detection and/or diagnosis of SARS-CoV-2 by FDA under an Emergency Use Authorization (EUA). This EUA will remain  in effect (meaning this  test can be used) for the duration of the COVID-19 declaration under Section 564(b)(1) of the Act, 21 U.S.C.section 360bbb-3(b)(1), unless the authorization is terminated  or revoked sooner.       Influenza A by PCR NEGATIVE NEGATIVE Final   Influenza B by PCR NEGATIVE NEGATIVE Final    Comment: (NOTE) The Xpert Xpress SARS-CoV-2/FLU/RSV plus assay is intended as an aid in the diagnosis of influenza from Nasopharyngeal swab specimens and should not be used as a sole basis for treatment. Nasal washings and aspirates are unacceptable for Xpert Xpress SARS-CoV-2/FLU/RSV testing.  Fact Sheet for Patients: EntrepreneurPulse.com.au  Fact Sheet for Healthcare Providers: IncredibleEmployment.be  This test is not yet approved or cleared by the [place] FDA and has been authorized for detection and/or diagnosis of SARS-CoV-2 by FDA under an Emergency Use Authorization (EUA). This EUA will remain in effect (meaning this test can be used) for the duration of the COVID-19 declaration under Section 564(b)(1) of the Act, 21 U.S.C. section 360bbb-3(b)(1), unless the authorization is terminated or revoked.  Performed at [facility], [place] [address], [place], [place] [address]       Pertinent Lab seen by me: CBC Latest Ref Rng & Units 03/12/2021 03/11/2021 01/31/2021  WBC 4.0 - 10.5 K/uL 6.6 6.0 8.5  Hemoglobin 12.0 - 15.0 g/dL 13.3 14.1 15.5  Hematocrit 36.0 - 46.0 % 38.7 41.9 45.7(H)  Platelets 150 - 400 K/uL 241 257 431(H)   CMP Latest Ref Rng & Units 03/12/2021 03/11/2021 01/31/2021  Glucose 70 - 99 mg/dL 94 121(H) 82  BUN 8 - 23 mg/dL 7(L) 11 11  Creatinine 0.44 - 1.00 mg/dL 0.71 0.67 0.68  Sodium 135 - 145 mmol/L 135 128(L) 133(L)  Potassium 3.5 - 5.1 mmol/L 4.3 3.4(L) 4.8  Chloride 98 - 111 mmol/L 99 95(L) 94(L)  CO2 22 - 32 mmol/L _0 Calcium 8.9 - 10.3 mg/dL 9.1 8.8(L) 10.6(H)  Total Protein 6.5 - 8.1 g/dL 6.6 - 8.1  Total  Bilirubin 0.3 - 1.2 mg/dL 0.6 - 0.5  Alkaline Phos 38 - 126 U/L 69 - -  AST 15 - 41 U/L 24 - 22  ALT 0 - 44 U/L 22 - 21    Pertinent Imagings/Other Imagings Plain films and CT images have been personally visualized and interpreted; radiology reports have been reviewed. Decision making incorporated into the Impression / Recommendations.  Chest Xray 03/11/21 FINDINGS: Cardiac shadow is within normal limits. Aortic calcifications are again noted. Right-sided PICC line is noted with catheter tip in the mid superior vena cava. This is within normal limits. The lungs are well aerated bilaterally with biapical scarring and bullous changes. These changes are stable in appearance from the prior CT examination. Breast implants are noted.  IMPRESSION: Chronic changes in the upper lobes bilaterally consistent with prior infection and scarring. No new focal abnormality is seen.  PICC line in satisfactory position.  CT angio chest PE 03/11/21 FINDINGS: Cardiovascular: No filling defects in the pulmonary arteries to suggest pulmonary emboli. Heart is normal size. Aorta is normal caliber. Coronary artery and aortic calcifications.  Mediastinum/Nodes: No mediastinal, hilar, or axillary adenopathy. Trachea and esophagus are unremarkable. Thyroid unremarkable.  Lungs/Pleura: Severe bullous emphysema, bronchiectasis and scarring throughout the lungs, most pronounced in the upper lung zones.  Extensive nodular densities in the left lower lobe. Findings are stable since prior study. No effusions or acute infiltrates.  Upper Abdomen: Imaging into the upper abdomen demonstrates no acute findings.  Musculoskeletal: Bilateral breast implants noted. Otherwise chest wall soft tissues are unremarkable. No acute bony abnormality.  Review of the MIP images confirms the above findings.  IMPRESSION: Bullous disease or cavitary lesions in the upper lobes bilaterally, stable since prior study.  Extensive bronchiectasis, scarring and nodular disease throughout both lungs. Findings most compatible with chronic atypical infection such as mycobacterium avium.  No pulmonary embolus.  Coronary artery disease.  No acute cardiopulmonary disease.  Aortic Atherosclerosis (ICD10-I70.0).  I have spent 70 minutes for this patient encounter including review of prior medical records with greater than 50% of time being face to face and coordination of their care.  Electronically signed by:   Rosiland Oz, MD Infectious Disease Physician [facility] Infect[facility] Disease Pager: [phone]

## 2021-03-13 DIAGNOSIS — J479 Bronchiectasis, uncomplicated: Secondary | ICD-10-CM

## 2021-03-13 DIAGNOSIS — A319 Mycobacterial infection, unspecified: Secondary | ICD-10-CM | POA: Diagnosis not present

## 2021-03-13 DIAGNOSIS — A310 Pulmonary mycobacterial infection: Secondary | ICD-10-CM | POA: Diagnosis not present

## 2021-03-13 DIAGNOSIS — R0602 Shortness of breath: Secondary | ICD-10-CM | POA: Diagnosis not present

## 2021-03-13 LAB — CBC
HCT: 37.5 % (ref 36.0–46.0)
Hemoglobin: 12.7 g/dL (ref 12.0–15.0)
MCH: 31.6 pg (ref 26.0–34.0)
MCHC: 33.9 g/dL (ref 30.0–36.0)
MCV: 93.3 fL (ref 80.0–100.0)
Platelets: 250 10*3/uL (ref 150–400)
RBC: 4.02 MIL/uL (ref 3.87–5.11)
RDW: 12 % (ref 11.5–15.5)
WBC: 5.8 10*3/uL (ref 4.0–10.5)
nRBC: 0 % (ref 0.0–0.2)

## 2021-03-13 LAB — COMPREHENSIVE METABOLIC PANEL
ALT: 22 U/L (ref 0–44)
AST: 23 U/L (ref 15–41)
Albumin: 3.1 g/dL — ABNORMAL LOW (ref 3.5–5.0)
Alkaline Phosphatase: 68 U/L (ref 38–126)
Anion gap: 9 (ref 5–15)
BUN: 12 mg/dL (ref 8–23)
CO2: 27 mmol/L (ref 22–32)
Calcium: 8.9 mg/dL (ref 8.9–10.3)
Chloride: 97 mmol/L — ABNORMAL LOW (ref 98–111)
Creatinine, Ser: 0.67 mg/dL (ref 0.44–1.00)
GFR, Estimated: >60 mL/min (ref >60)
Glucose, Bld: 95 mg/dL (ref 70–99)
Potassium: 3.8 mmol/L (ref 3.5–5.1)
Sodium: 133 mmol/L — ABNORMAL LOW (ref 135–145)
Total Bilirubin: 0.5 mg/dL (ref 0.3–1.2)
Total Protein: 7 g/dL (ref 6.5–8.1)

## 2021-03-13 LAB — HEMOGLOBIN A1C
Hgb A1c MFr Bld: 6.3 % — ABNORMAL HIGH (ref 4.8–5.6)
Mean Plasma Glucose: 134 mg/dL

## 2021-03-13 LAB — RPR: RPR Ser Ql: NONREACTIVE

## 2021-03-13 MED ORDER — SODIUM CHLORIDE 0.9 % IV SOLN
12.0000 g | INTRAVENOUS | Status: DC
  Administered 2021-03-14 – 2021-03-20 (×7): 12 g via INTRAVENOUS
  Filled 2021-03-13 (×9): qty 12

## 2021-03-13 MED ORDER — ACETYLCYSTEINE 20 % IN SOLN
600.0000 mg | Freq: Two times a day (BID) | RESPIRATORY_TRACT | Status: DC
  Administered 2021-03-13 – 2021-03-19 (×4): 600 mg via ORAL
  Filled 2021-03-13 (×16): qty 4

## 2021-03-13 MED ORDER — SODIUM CHLORIDE 0.9 % IV SOLN
2.0000 g | INTRAVENOUS | Status: AC
  Administered 2021-03-13 – 2021-03-14 (×5): 2 g via INTRAVENOUS
  Filled 2021-03-13 (×5): qty 2

## 2021-03-13 NOTE — Progress Notes (Signed)
Patient requesting an order to put in for a shower. Intern paged. Waiting for response.  Kenard Gower, RN

## 2021-03-13 NOTE — Progress Notes (Signed)
Family Medicine Teaching Service Daily Progress Note Intern Pager: [phone]  Patient name: Victoria Mendoza Medical record number: [id number] Date of birth: [birth year] Age: 65 y.o. Gender: female  Primary Care Provider: Patient, No Pcp Per (Inactive) Consultants: ID Code Status: DNR  Pt Overview and Major Events to Date:  4/16 admitted  Assessment and Plan: Clairessa Boulet is a 65 y.o. female presenting with worsening shortness of breath. 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   Dyspnea on exertion, in setting of known MAC/M. Abscessus: Subacute, stable.  Patient received oxygen overnight, off now. Feels like her breathing is improved. Audible inspiratory upper airway wheezing from bedside. Lungs CTAB. ECHO demonstrated EF 70-75%, hyperdynamic LV, small apical pericardial effusion. HIV and RPR negative.  - Albuterol inhaler as needed, assess response - Incentive spirometry every hour - Oxygen if needed - Continue antibiotic regimen as follows:             - P.o. azithromycin 250 mg daily             - P.o. ethambutol 1000 mg daily             - p.o. rifampin 600 mg daily             - IV amikacin 1000 mg MWF             - Hold home tigecycline d/t patient refusal/intolerance    Hypertension: Chronic, stable. Patient normotensive last 24 hours. Holding home bisoprolol-HCTZ.  - continue BP monitoring   Hyponatremia: Chronic, improved. Na 133 today. Holding HCTZ as above.  - monitor Na with BMP   Tension headache Posterior tension headache complaint this morning.  - K pad as needed  - Tylenol as needed    Bilateral peripheral neuropathy: Chronic. Likely side effect of antibiotic therapy for MAC/tuberculosis. Taking B6 chronically. Vitamin B12 level yesterday 840.  - Consider further work-up outpatient/symptomatic therapy   Previous tuberculosis: Resolved, stable. Several years ago  and most recently in 2018, fully treated.  FEN/GI: Regular PPx: Lovenox   Status is: Observation  The patient remains OBS appropriate and will d/c before 2 midnights.  Dispo:  Patient From: Home  Planned Disposition: Home  Medically stable for discharge: No     Subjective:  Patient found sitting in bed with legs crossed in front of her, sitting upright, no acute distress. Nurse at bedside. Reports improved breathing after receiving oxygen overnight. Some nausea because she has to take meds on empty stomach.   Objective: Temp:  [98.2 F (36.8 C)] 98.2 F (36.8 C) (04/18 0522) Pulse Rate:  [88-106] 88 (04/18 0522) Resp:  [19-20] 20 (04/18 0522) BP: (118-128)/(58-77) 118/73 (04/18 0522) SpO2:  [95 %-100 %] 100 % (04/18 0522) Physical Exam: General: awake, alert, oriented, no acute distess Cardiovascular: RRR, no murmur Respiratory: CTAB, light inspiratory upper airway wheezing audible from bedside  Laboratory: Recent Labs  Lab 03/11/21 1438 03/12/21 0500  WBC 6.0 6.6  HGB 14.1 13.3  HCT 41.9 38.7  PLT 257 241   Recent Labs  Lab 03/11/21 1438 03/12/21 0500  NA 128* 135  K 3.4* 4.3  CL 95* 99  CO2 26 28  BUN 11 7*  CREATININE 0.67 0.71  CALCIUM 8.8* 9.1  PROT  --  6.6  BILITOT  --  0.6  ALKPHOS  --  69  ALT  --  22  AST  --  24  GLUCOSE 121* 94  Imaging/Diagnostic Tests: None last 24 hours.   Ezequiel Essex, MD 03/13/2021, 10:33 AM PGY-1, [place] Intern pager: [phone], text pages welcome

## 2021-03-13 NOTE — Consult Note (Signed)
[place] [facility] Infectious [facility]  Total days of antibiotics 2/iv tigecycline and amikacin       Reason for Consult: antibiotic intolerance for MAI/M.abscessus   Referring Physician: eniola  Active Problems:   Mycobacterium avium complex ([place])   SOB (shortness of breath)    HPI: Victoria Mendoza is a 65 y.o. 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  She has been on MAI treatment since 04/19/20 with azithro 500, rif 600 and emb 800 daily. 4/21 cx of MAC (clarithro MIC 1, ami 32) and m.abscessus ( S ami, I imi, cefoxitin, linezolid, R clarithro, doxy, minocycline, cipro, and moxi) At that time her chest CT showing extensive nodules involving both lungs large biapical cavities L> R and bronchiectasis. She had repeat cx at roughly 3 months of treatment -still showing MAC (clarithro MIC 2, ami 21) and at 5 months m.abscessus still + on cx (S amikacin, I imipenem, cefoxitin, R clarithromycin, doxycycline, minocycline, ciprofloxacin, moxifloxacin, linezolid). She had not started treatment yet for m.abscessus, though Dr Megan Salon had discussed creating regimen to treat both. She was referred to [facility], seen by Dr Lorenda Cahill, at that time her pulmonary symptoms progressed with decreased exercise tolerance. The recommendation for the patient included - Azithromycin 250 mg daily - Rifampin 600 mg daily - Ethambutol 1000 mg daily - Amikacin 1000 mg IV 3x/weekly - Imipenem 1000 mg IV q12h - Tigecycline 50 mg IV daily - N-acetylcysteine 600 mg twice daily (for potential otoprotective effect while on amikacin)  She had also been seen by thoracic surgery at [facility] who would consider doing partial lobectomy after 2-3 months of treatment  She had started her regimen on 3/14 but then stopped imipenem on 3/22 due to side effects but overall struggling with the load of antibiotics in the  setting of fatigue and easily becoming short of breath. Efforts were done to restart the regimen in a staggered approach and has been following up on 4/5 and 4/12 by video visit but appears to be taking everything except imipenem. She was admitted on 4/16 for management and helping decide on tolerable regimen  Denies hearing loss, fever, chills, nightsweats, N/V/D but has non productive cough, easily gets fatigued, DOE, +nervous. Otherwise 12 point ros is negative   Past Medical History:  Diagnosis Date  . Hypertension     Allergies: No Known Allergies     MEDICATIONS: . acetylcysteine  600 mg Oral BID  . azithromycin  250 mg Oral Daily  . Chlorhexidine Gluconate Cloth  6 each Topical Daily  . enoxaparin (LOVENOX) injection  40 mg Subcutaneous Q24H  . ethambutol  1,000 mg Oral Daily  . rifampin  600 mg Oral q morning  . vitamin B-6  25 mg Oral Daily    Social History   Tobacco Use  . Smoking status: Never Smoker  . Smokeless tobacco: Never Used  Vaping Use  . Vaping Use: Never used  Substance Use Topics  . Alcohol use: Never  . Drug use: Never    Family History  Problem Relation Age of Onset  . Cancer Mother   . Heart attack Father     OBJECTIVE: Temp:  [98.2 F (36.8 C)] 98.2 F (36.8 C) (04/18 0522) Pulse Rate:  [88-103] 88 (04/18 0522) Resp:  [20] 20 (04/18 0522) BP: (118-128)/(58-73) 118/73 (04/18 0522) SpO2:  [95 %-100 %] 100 % (04/18 0522) Physical Exam  Constitutional:  oriented to person, place, and time. appears well-developed and well-nourished. No distress.  HENT: [place]/AT, PERRLA, no scleral icterus Mouth/Throat: Oropharynx is clear and moist. No oropharyngeal exudate.  Cardiovascular: Normal rate, regular rhythm and normal heart sounds. Exam reveals no gallop and no friction rub.  No murmur heard.  Pulmonary/Chest: Effort normal and breath sounds in the left, but rhonchi on right anterior and bilateral bases Neck = supple, no nuchal  rigidity Abdominal: Soft. Bowel sounds are normal.  exhibits no distension. There is no tenderness.  Lymphadenopathy: no cervical adenopathy. No axillary adenopathy Neurological: alert and oriented to person, place, and time.  Skin: Skin is warm and dry. No rash noted. No erythema.  Psychiatric: a normal mood and affect.  behavior is normal.   LABS: Results for orders placed or performed during the hospital encounter of 03/11/21 (from the past 48 hour(s))  Troponin I (High Sensitivity)     Status: None   Collection Time: 03/11/21  4:37 PM  Result Value Ref Range   Troponin I (High Sensitivity) 8 <18 ng/L    Comment: (NOTE) Elevated high sensitivity troponin I (hsTnI) values and significant  changes across serial measurements may suggest ACS but many other  chronic and acute conditions are known to elevate hsTnI results.  Refer to the "Links" section for chest pain algorithms and additional  guidance. Performed at [facility], [place] [address], [place],  [address]   Resp Panel by RT-PCR (Flu A&B, Covid) Nasopharyngeal Swab     Status: None   Collection Time: 03/11/21  9:15 PM   Specimen: Nasopharyngeal Swab; Nasopharyngeal(NP) swabs in vial transport medium  Result Value Ref Range   SARS Coronavirus 2 by RT PCR NEGATIVE NEGATIVE    Comment: (NOTE) SARS-CoV-2 target nucleic acids are NOT DETECTED.  The SARS-CoV-2 RNA is generally detectable in upper respiratory specimens during the acute phase of infection. The lowest concentration of SARS-CoV-2 viral copies this assay can detect is 138 copies/mL. A negative result does not preclude SARS-Cov-2 infection and should not be used as the sole basis for treatment or other patient management decisions. A negative result may occur with  improper specimen collection/handling, submission of specimen other than nasopharyngeal swab, presence of viral mutation(s) within the areas targeted by this assay, and inadequate number of  viral copies(<138 copies/mL). A negative result must be combined with clinical observations, patient history, and epidemiological information. The expected result is Negative.  Fact Sheet for Patients:  EntrepreneurPulse.com.au  Fact Sheet for Healthcare Providers:  IncredibleEmployment.be  This test is no t yet approved or cleared by the [place] FDA and  has been authorized for detection and/or diagnosis of SARS-CoV-2 by FDA under an Emergency Use Authorization (EUA). This EUA will remain  in effect (meaning this test can be used) for the duration of the COVID-19 declaration under Section 564(b)(1) of the Act, 21 U.S.C.section 360bbb-3(b)(1), unless the authorization is terminated  or revoked sooner.       Influenza A by PCR NEGATIVE NEGATIVE   Influenza B by PCR NEGATIVE NEGATIVE    Comment: (NOTE) The Xpert Xpress SARS-CoV-2/FLU/RSV plus assay is intended as an aid in the diagnosis of influenza from Nasopharyngeal swab specimens and should not be used as a sole basis for treatment. Nasal washings and aspirates are unacceptable for Xpert Xpress SARS-CoV-2/FLU/RSV testing.  Fact Sheet for Patients: EntrepreneurPulse.com.au  Fact Sheet for Healthcare Providers: IncredibleEmployment.be  This test is not yet approved or cleared by the [place]  FDA and has been authorized for detection and/or diagnosis of SARS-CoV-2 by FDA under an Emergency Use Authorization (EUA). This EUA will remain in effect (meaning this test can be used) for the duration of the COVID-19 declaration under Section 564(b)(1) of the Act, 21 U.S.C. section 360bbb-3(b)(1), unless the authorization is terminated or revoked.  Performed at [facility], [place] [address] [place], [place] [address]   HIV Antibody (routine testing w rflx)     Status: None   Collection Time: 03/12/21  5:00 AM  Result Value Ref Range   HIV  Screen 4th Generation wRfx Non Reactive Non Reactive    Comment: Performed at [facility], [place] [address], [place], [place] [address]  Comprehensive metabolic panel     Status: Abnormal   Collection Time: 03/12/21  5:00 AM  Result Value Ref Range   Sodium 135 135 - 145 mmol/L   Potassium 4.3 3.5 - 5.1 mmol/L    Comment: NO VISIBLE HEMOLYSIS   Chloride 99 98 - 111 mmol/L   CO2 28 22 - 32 mmol/L   Glucose, Bld 94 70 - 99 mg/dL    Comment: Glucose reference range applies only to samples taken after fasting for at least 8 hours.   BUN 7 (L) 8 - 23 mg/dL   Creatinine, Ser 0.71 0.44 - 1.00 mg/dL   Calcium 9.1 8.9 - 10.3 mg/dL   Total Protein 6.6 6.5 - 8.1 g/dL   Albumin 2.9 (L) 3.5 - 5.0 g/dL   AST 24 15 - 41 U/L   ALT 22 0 - 44 U/L   Alkaline Phosphatase 69 38 - 126 U/L   Total Bilirubin 0.6 0.3 - 1.2 mg/dL   GFR, Estimated >60 >60 mL/min    Comment: (NOTE) Calculated using the CKD-EPI Creatinine Equation (2021)    Anion gap 8 5 - 15    Comment: Performed at [facility] [address], [place], [place] [address]  CBC     Status: None   Collection Time: 03/12/21  5:00 AM  Result Value Ref Range   WBC 6.6 4.0 - 10.5 K/uL   RBC 4.30 3.87 - 5.11 MIL/uL   Hemoglobin 13.3 12.0 - 15.0 g/dL   HCT 38.7 36.0 - 46.0 %   MCV 90.0 80.0 - 100.0 fL   MCH 30.9 26.0 - 34.0 pg   MCHC 34.4 30.0 - 36.0 g/dL   RDW 11.9 11.5 - 15.5 %   Platelets 241 150 - 400 K/uL   nRBC 0.0 0.0 - 0.2 %    Comment: Performed at [facility], [place] [address], [place], [place] [address]  Hemoglobin A1c     Status: Abnormal   Collection Time: 03/12/21  5:00 AM  Result Value Ref Range   Hgb A1c MFr Bld 6.3 (H) 4.8 - 5.6 %    Comment: (NOTE)         Prediabetes: 5.7 - 6.4         Diabetes: >6.4         Glycemic control for adults with diabetes: <7.0    Mean Plasma Glucose 134 mg/dL    Comment: (NOTE) Performed At: [facility] [place], [place] [id number] [address]  MD MV:[id number]   RPR     Status: None   Collection Time: 03/12/21  5:00 AM  Result Value Ref Range   RPR Ser Ql NON REACTIVE NON REACTIVE    Comment: Performed at [facility], [place] [address] [place], [place] [address]  Vitamin B12     Status: None   Collection Time: 03/12/21  5:00 AM  Result Value Ref Range   Vitamin B-12 840 180 - 914 pg/mL    Comment: (NOTE) This assay is not validated for testing neonatal or myeloproliferative syndrome specimens for Vitamin B12 levels. Performed at [facility], [place] [address], [place], [place] [address]   Comprehensive metabolic panel     Status: Abnormal   Collection Time: 03/13/21 11:35 AM  Result Value Ref Range   Sodium 133 (L) 135 - 145 mmol/L   Potassium 3.8 3.5 - 5.1 mmol/L   Chloride 97 (L) 98 - 111 mmol/L   CO2 27 22 - 32 mmol/L   Glucose, Bld 95 70 - 99 mg/dL    Comment: Glucose reference range applies only to samples taken after fasting for at least 8 hours.   BUN 12 8 - 23 mg/dL   Creatinine, Ser 0.67 0.44 - 1.00 mg/dL   Calcium 8.9 8.9 - 10.3 mg/dL   Total Protein 7.0 6.5 - 8.1 g/dL   Albumin 3.1 (L) 3.5 - 5.0 g/dL   AST 23 15 - 41 U/L   ALT 22 0 - 44 U/L   Alkaline Phosphatase 68 38 - 126 U/L   Total Bilirubin 0.5 0.3 - 1.2 mg/dL   GFR, Estimated >60 >60 mL/min    Comment: (NOTE) Calculated using the CKD-EPI Creatinine Equation (2021)    Anion gap 9 5 - 15    Comment: Performed at [facility] [address], [place], [place] [address]  CBC     Status: None   Collection Time: 03/13/21 12:25 PM  Result Value Ref Range   WBC 5.8 4.0 - 10.5 K/uL   RBC 4.02 3.87 - 5.11 MIL/uL   Hemoglobin 12.7 12.0 - 15.0 g/dL   HCT 37.5 36.0 - 46.0 %   MCV 93.3 80.0 - 100.0 fL   MCH 31.6 26.0 - 34.0 pg   MCHC 33.9 30.0 - 36.0 g/dL   RDW 12.0 11.5 - 15.5 %   Platelets 250 150 - 400 K/uL   nRBC 0.0 0.0 - 0.2 %    Comment: Performed at [facility], [place] [address], [place],  [address]     MICRO:  IMAGING: DG Chest 2 View  Result Date: 03/11/2021 CLINICAL DATA:  Shortness of breath, follow-up PICC line EXAM: CHEST - 2 VIEW COMPARISON:  03/14/2020 FINDINGS: Cardiac shadow is within normal limits. Aortic calcifications are again noted. Right-sided PICC line is noted with catheter tip in the mid superior vena cava. This is within normal limits. The lungs are well aerated bilaterally with biapical scarring and bullous changes. These changes are stable in appearance from the prior CT examination. Breast implants are noted. IMPRESSION: Chronic changes in the upper lobes bilaterally consistent with prior infection and scarring. No new focal abnormality is seen. PICC line in satisfactory position. Electronically Signed   By: Inez Catalina M.D.   On: 03/11/2021 15:26   CT Angio Chest PE W and/or Wo Contrast  Result Date: 03/11/2021 CLINICAL DATA:  Shortness of breath EXAM: CT ANGIOGRAPHY CHEST WITH CONTRAST TECHNIQUE: Multidetector CT imaging of the chest was performed using the standard protocol during bolus administration of intravenous contrast. Multiplanar CT image reconstructions and MIPs were obtained to evaluate the vascular anatomy. CONTRAST:  33m OMNIPAQUE IOHEXOL 350 MG/ML SOLN COMPARISON:  04/15/2020 FINDINGS: Cardiovascular: No filling  defects in the pulmonary arteries to suggest pulmonary emboli. Heart is normal size. Aorta is normal caliber. Coronary artery and aortic calcifications. Mediastinum/Nodes: No mediastinal, hilar, or axillary adenopathy. Trachea and esophagus are unremarkable. Thyroid unremarkable. Lungs/Pleura: Severe bullous emphysema, bronchiectasis and scarring throughout the lungs, most pronounced in the upper lung zones. Extensive nodular densities in the left lower lobe. Findings are stable since prior study. No effusions or acute infiltrates. Upper Abdomen: Imaging into the upper abdomen demonstrates no acute findings. Musculoskeletal: Bilateral breast implants  noted. Otherwise chest wall soft tissues are unremarkable. No acute bony abnormality. Review of the MIP images confirms the above findings. IMPRESSION: Bullous disease or cavitary lesions in the upper lobes bilaterally, stable since prior study. Extensive bronchiectasis, scarring and nodular disease throughout both lungs. Findings most compatible with chronic atypical infection such as mycobacterium avium. No pulmonary embolus. Coronary artery disease. No acute cardiopulmonary disease. Aortic Atherosclerosis (ICD10-I70.0). Electronically Signed   By: Rolm Baptise M.D.   On: 03/11/2021 19:39   ECHOCARDIOGRAM COMPLETE  Result Date: 03/12/2021    ECHOCARDIOGRAM REPORT   Patient Name:   MANUEL LAWHEAD Date of Exam: 03/12/2021 Medical Rec #:  [id number]    Height:       61.0 in Accession #:    [id number]   Weight:       116.4 lb Date of Birth:  09/09/1956     BSA:          1.501 m Patient Age:    2 years     BP:           136/76 mmHg Patient Gender: F            HR:           98 bpm. Exam Location:  Inpatient Procedure: 2D Echo, Cardiac Doppler and Color Doppler Indications:    SBE I33.9  History:        Patient has no prior history of Echocardiogram examinations.                 Signs/Symptoms:Shortness of Breath. Bronchiectasis without                 complication, Mycobacterium abscessus & avium infection.  Sonographer:    Alvino Chapel RCS Referring Phys: [id number] Phill Myron ENIOLA IMPRESSIONS  1. Left ventricular ejection fraction, by estimation, is 70 to 75%. The left ventricle has hyperdynamic function. The left ventricle has no regional wall motion abnormalities. Left ventricular diastolic parameters are indeterminate.  2. Right ventricular systolic function is normal. The right ventricular size is normal.  3. A small pericardial effusion is present without evidence of increased pericardial pressure. The pericardial effusion is surrounding the apex.  4. The mitral valve is normal in structure. No evidence of mitral  valve regurgitation.  5. The aortic valve is tricuspid. Aortic valve regurgitation is not visualized. No aortic stenosis is present.  6. The inferior vena cava is normal in size with greater than 50% respiratory variability, suggesting right atrial pressure of 3 mmHg.  7. Cannot rule out R-L Color Doppler flow in the subcostal view- consider bubble study only if clinical concern for shunting. Comparison(s): No prior Echocardiogram. FINDINGS  Left Ventricle: Left ventricular ejection fraction, by estimation, is 70 to 75%. The left ventricle has hyperdynamic function. The left ventricle has no regional wall motion abnormalities. The left ventricular internal cavity size was small. There is no  left ventricular hypertrophy. Left ventricular diastolic parameters are indeterminate. Right Ventricle: The right  ventricular size is normal. No increase in right ventricular wall thickness. Right ventricular systolic function is normal. Left Atrium: Left atrial size was normal in size. Right Atrium: Right atrial size was normal in size. Pericardium: A small pericardial effusion is present. The pericardial effusion is surrounding the apex. Mitral Valve: The mitral valve is normal in structure. No evidence of mitral valve regurgitation. Tricuspid Valve: The tricuspid valve is grossly normal. Tricuspid valve regurgitation is not demonstrated. No evidence of tricuspid stenosis. Aortic Valve: The aortic valve is tricuspid. Aortic valve regurgitation is not visualized. No aortic stenosis is present. Pulmonic Valve: The pulmonic valve was normal in structure. Pulmonic valve regurgitation is not visualized. No evidence of pulmonic stenosis. Aorta: The aortic root is normal in size and structure. Venous: The inferior vena cava is normal in size with greater than 50% respiratory variability, suggesting right atrial pressure of 3 mmHg. IAS/Shunts: Cannot rule out R-L Color Doppler flow.  LEFT VENTRICLE PLAX 2D LVIDd:         3.20 cm   Diastology LVIDs:         1.90 cm  LV e' medial:    7.94 cm/s LV PW:         0.90 cm  LV E/e' medial:  9.7 LV IVS:        1.00 cm  LV e' lateral:   8.16 cm/s LVOT diam:     1.80 cm  LV E/e' lateral: 9.4 LV SV:         53 LV SV Index:   35 LVOT Area:     2.54 cm  RIGHT VENTRICLE RV S prime:     14.00 cm/s TAPSE (M-mode): 1.7 cm LEFT ATRIUM             Index       RIGHT ATRIUM          Index LA diam:        1.70 cm 1.13 cm/m  RA Area:     6.80 cm LA Vol (A2C):   30.8 ml 20.52 ml/m RA Volume:   13.10 ml 8.73 ml/m LA Vol (A4C):   18.3 ml 12.19 ml/m LA Biplane Vol: 23.9 ml 15.93 ml/m  AORTIC VALVE LVOT Vmax:   109.50 cm/s LVOT Vmean:  75.400 cm/s LVOT VTI:    0.209 m  AORTA Ao Root diam: 3.00 cm MITRAL VALVE MV Area (PHT): 5.79 cm     SHUNTS MV Decel Time: 131 msec     Systemic VTI:  0.21 m MV E velocity: 77.10 cm/s   Systemic Diam: 1.80 cm MV A velocity: 111.00 cm/s MV E/A ratio:  0.69 Rudean Haskell MD Electronically signed by Rudean Haskell MD Signature Date/Time: 03/12/2021/6:00:12 PM    Final     HISTORICAL MICRO/IMAGING  Assessment/Plan:  65 yo with significant shortness of breath associated with pulmonary MAI/M.abscessus  MAI and M.abscessus pulmonary disease= will have her on continuous infusion of cefoxitin in addition to IV tigecycline and Iv amikacin (for which she is tolerating). She is leary to try imipenem again due to having side effects  - Azithromycin 250 mg daily - Rifampin 600 mg daily - Ethambutol 1000 mg daily - Amikacin 1000 mg IV 3x/weekly - cefoxitin 12gm IV continuous infusion - Tigecycline 50 mg IV daily - N-acetylcysteine 600 mg twice daily oral to minimize risk of ototoxicity  Discussed that we are likely to start the abtx clock over since she was only on 3 drugs for 1 week (3/14-3/22). Goal  is to continue for 2-3 months to get her to surgery and further treatment thereafter.  Shortness of breath = please have PT work with patient and monitor HR and pulse  ox. to document her tachycardia and hypoxia.may need supplemental oxygen  Anxiety = if she is hypoxic, recommend to start on supplemental oxygen but may need to start anxiolytic. Will discuss with patient tomorrow.

## 2021-03-13 NOTE — Progress Notes (Signed)
Mucomyst medication is in the form of oral but order says nebulizer / oral. Confirmed with Drue Second MD medication is to be given oral.  Kenard Gower, RN

## 2021-03-14 DIAGNOSIS — R0600 Dyspnea, unspecified: Secondary | ICD-10-CM | POA: Diagnosis not present

## 2021-03-14 DIAGNOSIS — J471 Bronchiectasis with (acute) exacerbation: Secondary | ICD-10-CM | POA: Diagnosis present

## 2021-03-14 DIAGNOSIS — I10 Essential (primary) hypertension: Secondary | ICD-10-CM | POA: Diagnosis present

## 2021-03-14 DIAGNOSIS — Z66 Do not resuscitate: Secondary | ICD-10-CM | POA: Diagnosis present

## 2021-03-14 DIAGNOSIS — I313 Pericardial effusion (noninflammatory): Secondary | ICD-10-CM | POA: Diagnosis present

## 2021-03-14 DIAGNOSIS — G629 Polyneuropathy, unspecified: Secondary | ICD-10-CM | POA: Diagnosis present

## 2021-03-14 DIAGNOSIS — G44209 Tension-type headache, unspecified, not intractable: Secondary | ICD-10-CM | POA: Diagnosis present

## 2021-03-14 DIAGNOSIS — E876 Hypokalemia: Secondary | ICD-10-CM | POA: Diagnosis present

## 2021-03-14 DIAGNOSIS — L818 Other specified disorders of pigmentation: Secondary | ICD-10-CM | POA: Diagnosis not present

## 2021-03-14 DIAGNOSIS — E871 Hypo-osmolality and hyponatremia: Secondary | ICD-10-CM | POA: Diagnosis present

## 2021-03-14 DIAGNOSIS — A310 Pulmonary mycobacterial infection: Secondary | ICD-10-CM | POA: Diagnosis present

## 2021-03-14 DIAGNOSIS — Z79899 Other long term (current) drug therapy: Secondary | ICD-10-CM | POA: Diagnosis not present

## 2021-03-14 DIAGNOSIS — Z20822 Contact with and (suspected) exposure to covid-19: Secondary | ICD-10-CM | POA: Diagnosis present

## 2021-03-14 DIAGNOSIS — R739 Hyperglycemia, unspecified: Secondary | ICD-10-CM | POA: Diagnosis present

## 2021-03-14 DIAGNOSIS — R0602 Shortness of breath: Secondary | ICD-10-CM | POA: Diagnosis present

## 2021-03-14 DIAGNOSIS — Z8611 Personal history of tuberculosis: Secondary | ICD-10-CM | POA: Diagnosis not present

## 2021-03-14 DIAGNOSIS — Z8249 Family history of ischemic heart disease and other diseases of the circulatory system: Secondary | ICD-10-CM | POA: Diagnosis not present

## 2021-03-14 DIAGNOSIS — Z9882 Breast implant status: Secondary | ICD-10-CM | POA: Diagnosis not present

## 2021-03-14 DIAGNOSIS — A319 Mycobacterial infection, unspecified: Secondary | ICD-10-CM | POA: Diagnosis not present

## 2021-03-14 DIAGNOSIS — J479 Bronchiectasis, uncomplicated: Secondary | ICD-10-CM | POA: Diagnosis not present

## 2021-03-14 MED ORDER — HYDROCOD POLST-CPM POLST ER 10-8 MG/5ML PO SUER
5.0000 mL | Freq: Two times a day (BID) | ORAL | Status: AC
  Administered 2021-03-14 – 2021-03-15 (×4): 5 mL via ORAL
  Filled 2021-03-14 (×4): qty 5

## 2021-03-14 NOTE — Evaluation (Signed)
Physical Therapy Evaluation and Discharge Patient Details Name: Victoria Mendoza MRN: [id number] DOB: Dec 26, 1955 Today's Date: 03/14/2021   History of Present Illness  65 y.o. female presented 03/11/21 with worsening shortness of breath. PMH is significant for Mycobacterium AVM pna with lung abscesses followed by ID (and awaiting consult with [facility] thoracic surgeon for posible lung resection), hypertension, bronchiectasis, previous tuberculosis, bil peripheral neuropathy  Clinical Impression   Patient evaluated by Physical Therapy with no further acute PT needs identified. All education has been completed and the patient has no further questions. Patient tends to move very quickly when exercising and HR 96-113 bpm and sats >94%. Educated on pursed lip breathing and moving more slowly as she does her calisthenics. See below for any follow-up Physical Therapy or equipment needs. PT is signing off. Thank you for this referral.     Follow Up Recommendations No PT follow up    Equipment Recommendations  Other (comment) (Educated pt to get a pulse oximeter and she can measure her oxygen saturation while exercising)    Recommendations for Other Services       Precautions / Restrictions Precautions Precautions: None      Mobility  Bed Mobility Overal bed mobility: Independent                  Transfers Overall transfer level: Independent                  Ambulation/Gait Ambulation/Gait assistance: Independent Gait Distance (Feet): 60 Feet Assistive device: None Gait Pattern/deviations: WFL(Within Functional Limits)     General Gait Details: patient walked laps in room as she did not want to go into hallway. She requested seated rest after 60 ft due to dyspnea. HR 96-113; sats >94% on room air  Stairs            Wheelchair Mobility    Modified Rankin (Stroke Patients Only)       Balance Overall balance assessment: Independent                                            Pertinent Vitals/Pain Pain Assessment: No/denies pain    Home Living Family/patient expects to be discharged to:: Private residence                      Prior Function Level of Independence: Independent         Comments: has been exercising (walking and weights and calisthenics) in preparation for surgery at [facility]. She is disappointed she has not been able to increase her distance or speed of walking and feels she is not making progress     Hand Dominance        Extremity/Trunk Assessment   Upper Extremity Assessment Upper Extremity Assessment: Overall WFL for tasks assessed    Lower Extremity Assessment Lower Extremity Assessment: Overall WFL for tasks assessed    Cervical / Trunk Assessment Cervical / Trunk Assessment: Normal  Communication   Communication: No difficulties  Cognition Arousal/Alertness: Awake/alert Behavior During Therapy: Anxious Overall Cognitive Status: Within Functional Limits for tasks assessed                                        General Comments      Exercises Other Exercises Other  Exercises: Pt demonstrated calisthenics she has been doing while hospitalized (and prior) and moves very quickly with dyspnea after 60 seconds and required seated rest. Educated on slowing movements and focusing on pursed lip breathing to help regulate the speed of her breathing while exercising. Pt able to return demonstrate and verbalized understanding of need to move slower to work on endurance and goal is not to elevate HR.   Assessment/Plan    PT Assessment Patent does not need any further PT services  PT Problem List         PT Treatment Interventions      PT Goals (Current goals can be found in the Care Plan section)  Acute Rehab PT Goals Patient Stated Goal: be able to improve her lungs as much as possible prior to surgery PT Goal Formulation: All assessment and education complete, DC  therapy    Frequency     Barriers to discharge        Co-evaluation               AM-PAC PT "6 Clicks" Mobility  Outcome Measure Help needed turning from your back to your side while in a flat bed without using bedrails?: None Help needed moving from lying on your back to sitting on the side of a flat bed without using bedrails?: None Help needed moving to and from a bed to a chair (including a wheelchair)?: None Help needed standing up from a chair using your arms (e.g., wheelchair or bedside chair)?: None Help needed to walk in hospital room?: None Help needed climbing 3-5 steps with a railing? : None 6 Click Score: 24    End of Session   Activity Tolerance: Treatment limited secondary to medical complications (Comment) (limits herself by dyspnea/speed of movement) Patient left: in bed;with call bell/phone within reach;Other (comment) (physician team) Nurse Communication: Mobility status;Other (comment) (sats and HR during exercise) PT Visit Diagnosis: Difficulty in walking, not elsewhere classified (R26.2)    Time: 7341-9379 PT Time Calculation (min) (ACUTE ONLY): 22 min   Charges:   PT Evaluation $PT Eval Low Complexity: 1 Low           Jerolyn Center, PT Pager [phone]   Zena Amos 03/14/2021, 11:44 AM

## 2021-03-14 NOTE — Progress Notes (Signed)
Family Medicine Teaching Service Daily Progress Note Intern Pager: [phone]  Patient name: Victoria Mendoza Medical record number: [id number] Date of birth: [birth year] Age: 65 y.o. Gender: female  Primary Care Provider: Patient, No Pcp Per (Inactive) Consultants: ID Code Status: DNR  Pt Overview and Major Events to Date:  4/16 admitted  Assessment and Plan: Victoria Mendoza a 65 y.o.femalepresenting with worsening shortness of breath. PMH is significant forMycobacterium(MAC and abscessus)infection followed by ID,hypertension, bronchiectasis,and previous tuberculosis.  Dyspnea on exertion, in setting of known MAC/M. Abscessus: Subacute, stable. Patient continues to have sensation of shortness of breath.  Complains of feeling of needing to cough up mucus, however unable to.  Try to wear oxygen overnight for her back, but gave her head headache.  Ambulatory pulse ox demonstrates SPO2 >94% on room air during ambulation.  ID concern for shortness of breath, possible medication interaction. -Albuterol inhaler as needed, assess response -Incentive spirometry every hour -Oxygen if needed -Continue antibiotic regimen as follows: -P.o. azithromycin 250 mg daily -P.o. ethambutol 1000 mg daily -p.o. rifampin 600 mg daily -IV amikacin 1000 mg MWF -Hold home tigecycline d/tpatient refusal/intolerance  Hypertension: Chronic, stable. Patient normotensive last 24 hours. Holding home bisoprolol-HCTZ.  - continue BP monitoring  Hyponatremia: Chronic, improved. Na 133 yesterday. Holding HCTZ as above.  - monitor Na with BMP every 48 hours, next tomorrow  Bilateral peripheral neuropathy: Chronic. Likely side effect of antibiotic therapy for MAC/tuberculosis.Taking B6 chronically. Vitamin B12 level yesterday 840.  -Consider further work-up outpatient/symptomatic therapy  Previous tuberculosis: Resolved,  stable. Several years ago and most recently in 2018, fully treated.   FEN/GI: Regular PPx: Lovenox   Status is: Observation  The patient remains OBS appropriate and will d/c before 2 midnights.  Dispo:  Patient From: Home  Planned Disposition: Home  Medically stable for discharge: No     Subjective:  Patient laying in bed comfortably.  No acute distress.  Reports continued shortness of breath.  Her oxygen overnight, but it gave her headache.  SPO2 normal overnight.  Objective: Temp:  [98 F (36.7 C)-98.4 F (36.9 C)] 98.3 F (36.8 C) (04/19 1203) Pulse Rate:  [98-105] 98 (04/19 1203) Resp:  [17-20] 17 (04/19 1203) BP: (119-137)/(70-80) 137/80 (04/19 1203) SpO2:  [92 %-96 %] 95 % (04/19 1203) Physical Exam: General: Awake, alert, oriented, no acute distress Cardiovascular: RRR, no murmur Respiratory: CTA B in all fields, appreciate upper airway phlegm with cough  Laboratory: Recent Labs  Lab 03/11/21 1438 03/12/21 0500 03/13/21 1225  WBC 6.0 6.6 5.8  HGB 14.1 13.3 12.7  HCT 41.9 38.7 37.5  PLT 257 241 250   Recent Labs  Lab 03/11/21 1438 03/12/21 0500 03/13/21 1135  NA 128* 135 133*  K 3.4* 4.3 3.8  CL 95* 99 97*  CO2 _0 BUN 11 7* 12  CREATININE 0.67 0.71 0.67  CALCIUM 8.8* 9.1 8.9  PROT  --  6.6 7.0  BILITOT  --  0.6 0.5  ALKPHOS  --  69 68  ALT  --  22 22  AST  --  24 23  GLUCOSE 121* 94 95    Imaging/Diagnostic Tests: None last 24 hours.   Ezequiel Essex, MD 03/14/2021, 2:31 PM PGY-1, [facility] Intern pager: [phone], text pages welcome

## 2021-03-14 NOTE — Progress Notes (Signed)
[place] for Infectious Disease    Date of Admission:  03/11/2021             ID: Victoria Mendoza is a 65 y.o. female with synptomatic m.abscessus/MAI pulmonary infection and abtx intolerance Active Problems:   Mycobacterium avium complex (HCC)   SOB (shortness of breath)    Subjective: Remains afebrile, tolerated receiving continuous infusion of cefoxitin. Still has numerous questions to how long she will need treatment and how soon she will be feeling better.  She has reported having some shortness of breath with ambulation but noted to be 110 HR and 94% with ambulation with PT. She reports some numbness bilaterally to great toe which she thinks she has had a for a few weeks.  Medications:  . acetylcysteine  600 mg Oral BID  . azithromycin  250 mg Oral Daily  . Chlorhexidine Gluconate Cloth  6 each Topical Daily  . chlorpheniramine-HYDROcodone  5 mL Oral Q12H  . enoxaparin (LOVENOX) injection  40 mg Subcutaneous Q24H  . ethambutol  1,000 mg Oral Daily  . rifampin  600 mg Oral q morning  . vitamin B-6  25 mg Oral Daily    Objective: Vital signs in last 24 hours: Temp:  [98 F (36.7 C)-98.4 F (36.9 C)] 98.3 F (36.8 C) (04/19 1203) Pulse Rate:  [98-105] 98 (04/19 1203) Resp:  [17-20] 17 (04/19 1203) BP: (119-137)/(70-80) 137/80 (04/19 1203) SpO2:  [92 %-96 %] 95 % (04/19 1203)  Physical Exam  Constitutional:  oriented to person, place, and time. appears well-developed and well-nourished. No distress.  HENT: [place]/AT, PERRLA, no scleral icterus Mouth/Throat: Oropharynx is clear and moist. No oropharyngeal exudate.  Cardiovascular: Normal rate, regular rhythm and normal heart sounds. Exam reveals no gallop and no friction rub.  No murmur heard.  Pulmonary/Chest: Effort normal and breath sounds normal. No respiratory distress.  has no wheezes. Left sided inspiratory crackles Neck = supple, no nuchal rigidity Abdominal: Soft. Bowel sounds are normal.  exhibits no  distension. There is no tenderness.  Lymphadenopathy: no cervical adenopathy. No axillary adenopathy Neurological: alert and oriented to person, place, and time.  Skin: Skin is warm and dry. No rash noted. No erythema.  Psychiatric: a normal mood and affect.  behavior is normal.    Lab Results Recent Labs    03/12/21 0500 03/13/21 1135 03/13/21 1225  WBC 6.6  --  5.8  HGB 13.3  --  12.7  HCT 38.7  --  37.5  NA 135 133*  --   K 4.3 3.8  --   CL 99 97*  --   CO2 28 27  --   BUN 7* 12  --   CREATININE 0.71 0.67  --    Liver Panel Recent Labs    03/12/21 0500 03/13/21 1135  PROT 6.6 7.0  ALBUMIN 2.9* 3.1*  AST 24 23  ALT 22 22  ALKPHOS 69 68  BILITOT 0.6 0.5    Microbiology: reviewed Studies/Results: ECHOCARDIOGRAM COMPLETE  Result Date: 03/12/2021    ECHOCARDIOGRAM REPORT   Patient Name:   Victoria Mendoza Date of Exam: 03/12/2021 Medical Rec #:  [id number]    Height:       61.0 in Accession #:    [id number]   Weight:       116.4 lb Date of Birth:  January 05, 1956     BSA:          1.501 m Patient Age:    1 years  BP:           136/76 mmHg Patient Gender: F            HR:           98 bpm. Exam Location:  Inpatient Procedure: 2D Echo, Cardiac Doppler and Color Doppler Indications:    SBE I33.9  History:        Patient has no prior history of Echocardiogram examinations.                 Signs/Symptoms:Shortness of Breath. Bronchiectasis without                 complication, Mycobacterium abscessus & avium infection.  Sonographer:    Alvino Chapel RCS Referring Phys: [id number] Phill Myron ENIOLA IMPRESSIONS  1. Left ventricular ejection fraction, by estimation, is 70 to 75%. The left ventricle has hyperdynamic function. The left ventricle has no regional wall motion abnormalities. Left ventricular diastolic parameters are indeterminate.  2. Right ventricular systolic function is normal. The right ventricular size is normal.  3. A small pericardial effusion is present without evidence of  increased pericardial pressure. The pericardial effusion is surrounding the apex.  4. The mitral valve is normal in structure. No evidence of mitral valve regurgitation.  5. The aortic valve is tricuspid. Aortic valve regurgitation is not visualized. No aortic stenosis is present.  6. The inferior vena cava is normal in size with greater than 50% respiratory variability, suggesting right atrial pressure of 3 mmHg.  7. Cannot rule out R-L Color Doppler flow in the subcostal view- consider bubble study only if clinical concern for shunting. Comparison(s): No prior Echocardiogram. FINDINGS  Left Ventricle: Left ventricular ejection fraction, by estimation, is 70 to 75%. The left ventricle has hyperdynamic function. The left ventricle has no regional wall motion abnormalities. The left ventricular internal cavity size was small. There is no  left ventricular hypertrophy. Left ventricular diastolic parameters are indeterminate. Right Ventricle: The right ventricular size is normal. No increase in right ventricular wall thickness. Right ventricular systolic function is normal. Left Atrium: Left atrial size was normal in size. Right Atrium: Right atrial size was normal in size. Pericardium: A small pericardial effusion is present. The pericardial effusion is surrounding the apex. Mitral Valve: The mitral valve is normal in structure. No evidence of mitral valve regurgitation. Tricuspid Valve: The tricuspid valve is grossly normal. Tricuspid valve regurgitation is not demonstrated. No evidence of tricuspid stenosis. Aortic Valve: The aortic valve is tricuspid. Aortic valve regurgitation is not visualized. No aortic stenosis is present. Pulmonic Valve: The pulmonic valve was normal in structure. Pulmonic valve regurgitation is not visualized. No evidence of pulmonic stenosis. Aorta: The aortic root is normal in size and structure. Venous: The inferior vena cava is normal in size with greater than 50% respiratory variability,  suggesting right atrial pressure of 3 mmHg. IAS/Shunts: Cannot rule out R-L Color Doppler flow.  LEFT VENTRICLE PLAX 2D LVIDd:         3.20 cm  Diastology LVIDs:         1.90 cm  LV e' medial:    7.94 cm/s LV PW:         0.90 cm  LV E/e' medial:  9.7 LV IVS:        1.00 cm  LV e' lateral:   8.16 cm/s LVOT diam:     1.80 cm  LV E/e' lateral: 9.4 LV SV:         53 LV  SV Index:   35 LVOT Area:     2.54 cm  RIGHT VENTRICLE RV S prime:     14.00 cm/s TAPSE (M-mode): 1.7 cm LEFT ATRIUM             Index       RIGHT ATRIUM          Index LA diam:        1.70 cm 1.13 cm/m  RA Area:     6.80 cm LA Vol (A2C):   30.8 ml 20.52 ml/m RA Volume:   13.10 ml 8.73 ml/m LA Vol (A4C):   18.3 ml 12.19 ml/m LA Biplane Vol: 23.9 ml 15.93 ml/m  AORTIC VALVE LVOT Vmax:   109.50 cm/s LVOT Vmean:  75.400 cm/s LVOT VTI:    0.209 m  AORTA Ao Root diam: 3.00 cm MITRAL VALVE MV Area (PHT): 5.79 cm     SHUNTS MV Decel Time: 131 msec     Systemic VTI:  0.21 m MV E velocity: 77.10 cm/s   Systemic Diam: 1.80 cm MV A velocity: 111.00 cm/s MV E/A ratio:  0.69 Rudean Haskell MD Electronically signed by Rudean Haskell MD Signature Date/Time: 03/12/2021/6:00:12 PM    Final      Assessment/Plan: Shortness of breath = likely from bronchiectasis and NTM infection. TTE showed EF 70%. On ambulation, she is not hypoxic to need supplemental oxygen. When she did have a trial of supp oxygen, she noted having headache  Will check with pulmonary if she would be candidate for pulm rehab or if this is insurance issue. She has home gym she reports.  M.abscessus pulmonary infection = her new regimen consists of : -- she tolerated continuous infusion cefoxitin last night. Will continue to see if still tolerates today. Plan to send home with the following  meds---   - Amikacin 1000 mg IV 3x/weekly - cefoxitin 12gm IV continuous infusion - Tigecycline 50 mg IV daily - N-acetylcysteine 600 mg twice daily oral to minimize risk of  ototoxicity   Continue treatment for MAC pulmonary infection with: - Azithromycin 250 mg daily - Rifampin 600 mg daily - Ethambutol 1000 mg daily  Anxiety = would discuss with patient to see if she is open to considering taking medication for anxiety. I suspect some of her emotional distress is due to anxiety.   Will have her follow up with dr Megan Salon in the id clinic next week.  [facility] Infectious Diseases Cell: [phone] Pager: [phone]  03/14/2021, 12:53 PM

## 2021-03-14 NOTE — Plan of Care (Signed)

## 2021-03-15 ENCOUNTER — Telehealth: Payer: 59 | Admitting: Internal Medicine

## 2021-03-15 ENCOUNTER — Encounter (HOSPITAL_COMMUNITY): Payer: Self-pay | Admitting: Family Medicine

## 2021-03-15 DIAGNOSIS — J471 Bronchiectasis with (acute) exacerbation: Principal | ICD-10-CM

## 2021-03-15 DIAGNOSIS — R0602 Shortness of breath: Secondary | ICD-10-CM | POA: Diagnosis not present

## 2021-03-15 DIAGNOSIS — A310 Pulmonary mycobacterial infection: Secondary | ICD-10-CM | POA: Diagnosis not present

## 2021-03-15 LAB — BASIC METABOLIC PANEL
Anion gap: 4 — ABNORMAL LOW (ref 5–15)
BUN: 12 mg/dL (ref 8–23)
CO2: 29 mmol/L (ref 22–32)
Calcium: 8.3 mg/dL — ABNORMAL LOW (ref 8.9–10.3)
Chloride: 100 mmol/L (ref 98–111)
Creatinine, Ser: 0.74 mg/dL (ref 0.44–1.00)
GFR, Estimated: >60 mL/min (ref >60)
Glucose, Bld: 85 mg/dL (ref 70–99)
Potassium: 3.9 mmol/L (ref 3.5–5.1)
Sodium: 133 mmol/L — ABNORMAL LOW (ref 135–145)

## 2021-03-15 MED ORDER — SODIUM CHLORIDE 3 % IN NEBU
4.0000 mL | INHALATION_SOLUTION | Freq: Two times a day (BID) | RESPIRATORY_TRACT | Status: AC
  Filled 2021-03-15 (×6): qty 4

## 2021-03-15 NOTE — Progress Notes (Signed)
OT Cancellation Note  Patient Details Name: Victoria Mendoza MRN: [id number] DOB: 06/16/1956   Cancelled Treatment:    Reason Eval/Treat Not Completed: OT screened, no needs identified, will sign off (Pt does not require an OT eval as pt is a Systems analyst by trade and is independent for nursing and physical therapy at this time.)   Flora Lipps, OTR/L Acute Rehabilitation Services Pager: [phone] Office: [phone]  Buddy Loeffelholz C 03/15/2021, 8:19 AM

## 2021-03-15 NOTE — Progress Notes (Signed)
[facility] for Infectious Disease    Date of Admission:  03/11/2021     ID: Victoria Mendoza is a 65 y.o. female with  MAI/M.abscessus chronic lung disease with worsening symptoms Active Problems:   Mycobacterium avium complex (HCC)   SOB (shortness of breath)    Subjective: Having occasional cough. No fevers, notices her HR in 110 with ambulation. She is noticing numbness to both feet over the last few months  Medications:  . acetylcysteine  600 mg Oral BID  . azithromycin  250 mg Oral Daily  . Chlorhexidine Gluconate Cloth  6 each Topical Daily  . chlorpheniramine-HYDROcodone  5 mL Oral Q12H  . enoxaparin (LOVENOX) injection  40 mg Subcutaneous Q24H  . ethambutol  1,000 mg Oral Daily  . rifampin  600 mg Oral q morning  . sodium chloride HYPERTONIC  4 mL Nebulization BID  . vitamin B-6  25 mg Oral Daily    Objective: Vital signs in last 24 hours: Temp:  [97.4 F (36.3 C)-98 F (36.7 C)] 98 F (36.7 C) (04/20 1227) Pulse Rate:  [95] 95 (04/20 1227) Resp:  [16-20] 16 (04/20 1227) BP: (121-126)/(77) 121/77 (04/20 1227) SpO2:  [96 %] 96 % (04/20 1227) Weight:  [52.8 kg] 52.8 kg (04/19 2348) Physical Exam  Constitutional:  oriented to person, place, and time. appears well-developed and well-nourished. No distress.  HENT: [place]/AT, PERRLA, no scleral icterus Mouth/Throat: Oropharynx is clear and moist. No oropharyngeal exudate.  Cardiovascular: Normal rate, regular rhythm and normal heart sounds. Exam reveals no gallop and no friction rub.  No murmur heard.  Pulmonary/Chest: Effort normal and breath sounds normal except mild expiratory wheeze on left. No respiratory distress.  Neck = supple, no nuchal rigidity Abdominal: Soft. Bowel sounds are normal.  exhibits no distension. There is no tenderness.  Lymphadenopathy: no cervical adenopathy. No axillary adenopathy Neurological: alert and oriented to person, place, and time.  Skin: Skin is warm and dry. No rash noted. No  erythema.  Psychiatric: a normal mood and affect.  behavior is normal.    Lab Results Recent Labs    03/13/21 1135 03/13/21 1225 03/15/21 0525  WBC  --  5.8  --   HGB  --  12.7  --   HCT  --  37.5  --   NA 133*  --  133*  K 3.8  --  3.9  CL 97*  --  100  CO2 27  --  29  BUN 12  --  12  CREATININE 0.67  --  0.74   Liver Panel Recent Labs    03/13/21 1135  PROT 7.0  ALBUMIN 3.1*  AST 23  ALT 22  ALKPHOS 68  BILITOT 0.5    Studies/Results: No results found.   Assessment/Plan: M.abscessus pulmonary infection = tolerating continuous infusion of cefoxitin, in addition to iv amikacin and iv tigecycline. Plan to try to treat for 2-3 months prior to see pulmonary CT surgery at [facility] = continue with azithro, emb, and rif  Possible exarcebation of bronchiectasis = discussed repeat sputum culture; continue with cough syrup if needed  Bilateral numbness to feet = this is relatively new finding per ROS. No thiamine deficiency. May be useful to have neurology outpatient follow up.   She is also concern for deconditioning since she is not able to work out as much due to pulmonary limitation. Discussed to see if pulmonary consult can see and whether she would be candidate for pulm rehab  [facility] for Infectious Diseases Cell: [phone] Pager: [phone]  03/15/2021, 5:16 PM

## 2021-03-15 NOTE — Consult Note (Signed)
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 She is a never smoker.  Prior to having TB she had no previous pulmonary history of family history of lung disease. She has never felt that she was back to her pre-TB pulmonary status. She was diagnosed with NTM- M. Abscessus and M. Avium in 2021 and has been managed primarily by the Infectious Disease team. She has been on a multi- antibiotic regimen daily for MAC: rifampin, azithromycin, ethambutol, plus IV amikacin & tigecycline via PICC.  She had started IV imipenem but stopped it due to side DOE & fatigue. She has persistence of these organisms despite treatment for 2 months for MAC; she had just started treatment for M. Abscessus but has persistently been positive for 5 months. She has been evaluated at [facility] and was recommended an alternative antibiotic regimen and consideration for partial lobectomy after 2-3 months of treatment.   Over the 2 weeks prior to admission she experienced worsening DOE and development of yellow sputum, which was progressed from her usual clear, foamy sputum. Her symptoms worsened acutely 2 days PTA. She denies hemoptysis. No nausea, vomiting, or diarrhea. She had diarrhea on oral antibiotics that resolved with probiotic use. She has been on IV cefoxitin, tigecycline, oral azithromycin, ethambutol, and rifampin since admission.  Pulmonary has been consulted to assess if pulmonary rehab would be beneficial to her. Sputum cultures have not been obtained.     Pertinent  Medical History  History of pulmonary TB 2018, treated for 6 months in [place] Bronchiectasis HTN  Significant Hospital  Events: Including procedures, antibiotic start and stop dates in addition to other pertinent events   . 4/16 admit .   Interim History / Subjective:    Objective   Blood pressure 121/77, pulse 95, temperature 98 F (36.7 C), temperature source Oral, resp. rate 16, height 5' 0.98" (1.549 m), weight 52.8 kg, SpO2 96 %.        Intake/Output Summary (Last 24 hours) at 03/15/2021 1423 Last data filed at 03/15/2021 1226 Gross per 24 hour  Intake 709.51 ml  Output --  Net 709.51 ml   Filed Weights   03/12/21 0311 03/14/21 2348  Weight: 52.8 kg 52.8 kg    Examination: General: middle aged woman sitting up in the chair in NAD HENT: [place]/AT, eyes anicteric Neck: no adenopathy Lungs: rhales on the R, inspiratory wheezing on the left, no rhonchi. Breathing comfortably on RA.  Cardiovascular: S1S2, RRRR Abdomen: thin, soft, NT Extremities: no peripheral edema, no cyanosis Neuro: awake, alert, answering questions appropriately Derm: no erythema around PICC line, no rashes  Labs/imaging that I have personally reviewed  (right click and "Reselect all SmartList Selections" daily)  WBC 5.8 CTA chest> no PE, fibrocavitary lung disease, bronchiectasis. Tree in bud and nodular opacities throughout lungs.   Resolved Hospital Problem list     Assessment & Plan:  Acute bronchiectasis exacerbation vs progressive NTM infection with chronic bronchiectasis M. Abscessus and MAC infections chronically Side effects of chronic antibiotic therapy -Collect routine sputum culture- concerned for other more typical organisms that could be causing this exacerbation, such as Pseudomonas. She was inconsistent with her imipenem PTA, which increases the  risk of Pseudomonas.   -Con't antibiotics per ID. Would not change antibiotics at this point without culture data. -I think she would be a great candidate for pulmonary rehab if she has insurance coverage for this.  -Airway clearance therapy regimen; she did  not like the acetylcysteine neb from this morning due to feeling tired. Recommend hypertonic saline + flutter BID (ordered)  Best practice (right click and "Reselect all SmartList Selections" daily)  Per primary  Labs   CBC: Recent Labs  Lab 03/11/21 1438 03/12/21 0500 03/13/21 1225  WBC 6.0 6.6 5.8  HGB 14.1 13.3 12.7  HCT 41.9 38.7 37.5  MCV 93.3 90.0 93.3  PLT 257 241 607    Basic Metabolic Panel: Recent Labs  Lab 03/11/21 1438 03/12/21 0500 03/13/21 1135 03/15/21 0525  NA 128* 135 133* 133*  K 3.4* 4.3 3.8 3.9  CL 95* 99 97* 100  CO2 _0 GLUCOSE 121* 94 95 85  BUN 11 7* 12 12  CREATININE 0.67 0.71 0.67 0.74  CALCIUM 8.8* 9.1 8.9 8.3*   GFR: Estimated Creatinine Clearance: 52.2 mL/min (by C-G formula based on SCr of 0.74 mg/dL). Recent Labs  Lab 03/11/21 1438 03/12/21 0500 03/13/21 1225  WBC 6.0 6.6 5.8    Liver Function Tests: Recent Labs  Lab 03/12/21 0500 03/13/21 1135  AST 24 23  ALT 22 22  ALKPHOS 69 68  BILITOT 0.6 0.5  PROT 6.6 7.0  ALBUMIN 2.9* 3.1*   No results for input(s): LIPASE, AMYLASE in the last 168 hours. No results for input(s): AMMONIA in the last 168 hours.  ABG No results found for: PHART, PCO2ART, PO2ART, HCO3, TCO2, ACIDBASEDEF, O2SAT   Coagulation Profile: No results for input(s): INR, PROTIME in the last 168 hours.  Cardiac Enzymes: No results for input(s): CKTOTAL, CKMB, CKMBINDEX, TROPONINI in the last 168 hours.  HbA1C: Hgb A1c MFr Bld  Date/Time Value Ref Range Status  03/12/2021 05:00 AM 6.3 (H) 4.8 - 5.6 % Final    Comment:    (NOTE)         Prediabetes: 5.7 - 6.4         Diabetes: >6.4         Glycemic control for adults with diabetes: <7.0     CBG: No results for input(s): GLUCAP in the last 168 hours.  Review of Systems:   Review of Systems  Constitutional: Positive for malaise/fatigue. Negative for chills, fever and weight loss.  HENT: Negative for congestion.   Respiratory: Positive  for sputum production and shortness of breath. Negative for hemoptysis.   Cardiovascular: Negative for chest pain and leg swelling.  Gastrointestinal: Negative for abdominal pain, diarrhea, heartburn, nausea and vomiting.  Musculoskeletal: Negative for joint pain and myalgias.  Skin: Negative for rash.  Neurological: Positive for tingling.     Past Medical History:  She,  has a past medical history of Bronchiectasis ([place]), Hypertension, and Pulmonary TB (2018).   Surgical History:   Past Surgical History:  Procedure Laterality Date  . PLACEMENT OF BREAST IMPLANTS       Social History:   reports that she has never smoked. She has never used smokeless tobacco. She reports that she does not drink alcohol and does not use drugs.   Family History:  Her family history includes Cancer in her mother; Heart attack in her father.   Allergies No Known Allergies   Home Medications  Prior to Admission medications   Medication Sig Start Date  End Date Taking? Authorizing Provider  amikacin (AMIKIN) IVPB Inject 1,000 mg into the vein 3 (three) times a week. Patient taking differently: Inject 1,000 mg into the vein every Monday, Wednesday, and Friday. 02/01/21  Yes Michel Bickers, MD  azithromycin (ZITHROMAX) 250 MG tablet Take 1 tablet (250 mg total) by mouth daily. 01/31/21  Yes Michel Bickers, MD  azithromycin (ZITHROMAX) 500 MG tablet Take 500 mg by mouth daily. 02/15/21  Yes [provider]  B Complex Vitamins (VITAMIN B COMPLEX) TABS Take 1 tablet by mouth daily.   Yes [provider]  bisoprolol-hydrochlorothiazide (ZIAC) 2.5-6.25 MG tablet Take 1 tablet by mouth daily.   Yes [provider]  ethambutol (MYAMBUTOL) 400 MG tablet Take 2-1/2 tablets by mouth daily Patient taking differently: Take 800 mg by mouth every morning. 01/31/21  Yes Michel Bickers, MD  Multiple Vitamin (MULTIVITAMIN WITH MINERALS) TABS tablet Take 1 tablet by mouth daily.   Yes [provider]  Omega-3 Fatty Acids (OMEGA 3 PO) Take 4 capsules by mouth daily.   Yes [provider]  polyvinyl alcohol (LIQUIFILM TEARS) 1.4 % ophthalmic solution Place 1 drop into both eyes daily as needed for dry eyes.   Yes [provider]  Pyridoxine HCl (VITAMIN B-6 PO) Take 1 tablet by mouth daily.   Yes [provider]  rifampin (RIFADIN) 300 MG capsule Take 2 capsules (600 mg total) by mouth daily. Patient taking differently: Take 600 mg by mouth every morning. 01/31/21  Yes Michel Bickers, MD  tigecycline (TYGACIL) 50 MG injection Inject 50 mg into the vein daily. Patient taking differently: Inject 50 mg into the vein every evening. 01/31/21  Yes Michel Bickers, MD  Acetylcysteine 600 MG CAPS Take 1 capsule (600 mg total) by mouth 2 (two) times daily. Patient not taking: No sig reported 01/31/21   Michel Bickers, MD  sodium chloride HYPERTONIC 3 % nebulizer solution Take by nebulization every 8 (eight) hours. Patient not taking: No sig reported 09/12/20   Lauraine Rinne, NP  triamcinolone (KENALOG) 0.025 % ointment Apply 1 application topically 2 (two) times daily. Patient not taking: No sig reported 07/15/20   Raylene Everts, MD       Julian Hy, DO 03/15/21 2:23 PM [facility]

## 2021-03-15 NOTE — Progress Notes (Signed)
Family Medicine Teaching Service Daily Progress Note Intern Pager: [phone]  Patient name: Victoria Mendoza Medical record number: [id number] Date of birth: Oct 30, 1956 Age: 65 y.o. Gender: female  Primary Care Provider: Patient, No Pcp Per (Inactive) Consultants: ID Code Status: DNR  Pt Overview and Major Events to Date:  4/16 admitted  Assessment and Plan: Victoria Mendoza a 65 y.o.femalepresenting with worsening shortness of breath. PMH is significant forMycobacterium(MAC and abscessus)infection followed by ID,hypertension, bronchiectasis,and previous tuberculosis.  Dyspnea on exertion, in setting of known MAC/M. Abscessus: Subacute, stable. ID noted yesterday that anxiety may be component of SOB. Today, patient feels continued SOB, mainly with exertion. Still has sensation of needing to produce phlegm, has not had productive cough.  Fairly wheezy on exam today.  -Albuterol inhaler as needed, assess response - continue tussionex 5 mL q12 - consulted pulmonology for possible pulm rehab; appreciate recommendations -Incentive spirometry every hour -Oxygen if needed -Continue antibiotic regimen as follows: -P.o. azithromycin 250 mg daily -P.o. ethambutol 1000 mg daily -p.o. rifampin 600 mg daily -IV amikacin 1000 mg MWF -Hold home tigecycline d/tpatient refusal/intolerance  Hypertension: Chronic, stable. Patient normotensive last 24 hours. Holding home bisoprolol-HCTZ.  - continue BP monitoring  Hyponatremia: Chronic, improved. Na 133 today, stable from 4/18. Holding HCTZ as above.  - monitor Na with BMP every 48 hours, next 4/22  Bilateral peripheral neuropathy: Chronic. ID does not believe this would be side effect of her antibiotic regimen. ID suggests possible spinal imaging. Patient reports being stiff and tingly in the morning. No saddle anesthesia, incontinence, difficulty ambulating. Able to  ambulate, stretch, etc without trouble from legs. Takes B6 at home. B12 measured during admission is normal.  - monitor - recommend follow outpatient  Previous tuberculosis: Resolved, stable. Several years ago and most recently in 2018, fully treated.   FEN/GI: regular PPx: lovenox   Status is: Inpatient  Remains inpatient appropriate because:IV treatments appropriate due to intensity of illness or inability to take PO   Dispo:  Patient From: Home  Planned Disposition: Home  Medically stable for discharge: No      Subjective:  Today, patient feels continued SOB, mainly with exertion. Still has sensation of needing to produce phlegm, has not had productive cough.  Fairly wheezy on exam today. Patient reports being stiff and tingly in the morning. No saddle anesthesia, incontinence, difficulty ambulating. Able to ambulate, stretch, etc without trouble from legs.  Objective: Temp:  [97.4 F (36.3 C)-98.3 F (36.8 C)] 97.4 F (36.3 C) (04/19 2014) Pulse Rate:  [98] 98 (04/19 1203) Resp:  [17-20] 20 (04/19 2014) BP: (126-137)/(77-80) 126/77 (04/19 2014) SpO2:  [95 %] 95 % (04/19 1203) Weight:  [52.8 kg] 52.8 kg (04/19 2348) Physical Exam: General: awake, alert, oriented, slightly anxious, appears comfortable Cardiovascular: RRR, no murmur Respiratory: diffuse inspiratory wheezing in all fields, appreciable upper airway congestion Extremities: moving all spontaneously  Laboratory: Recent Labs  Lab 03/11/21 1438 03/12/21 0500 03/13/21 1225  WBC 6.0 6.6 5.8  HGB 14.1 13.3 12.7  HCT 41.9 38.7 37.5  PLT 257 241 250   Recent Labs  Lab 03/12/21 0500 03/13/21 1135 03/15/21 0525  NA 135 133* 133*  K 4.3 3.8 3.9  CL 99 97* 100  CO2 _0 BUN 7* 12 12  CREATININE 0.71 0.67 0.74  CALCIUM 9.1 8.9 8.3*  PROT 6.6 7.0  --   BILITOT 0.6 0.5  --   ALKPHOS 69 68  --   ALT 22 22  --  AST 24 23  --   GLUCOSE 94 95 85    Imaging/Diagnostic Tests: None last 24  hours.   Ezequiel Essex, MD 03/15/2021, 7:42 AM PGY-1, [place] Intern pager: [phone], text pages welcome

## 2021-03-16 ENCOUNTER — Telehealth: Payer: Self-pay

## 2021-03-16 ENCOUNTER — Telehealth (HOSPITAL_COMMUNITY): Payer: Self-pay

## 2021-03-16 DIAGNOSIS — L818 Other specified disorders of pigmentation: Secondary | ICD-10-CM

## 2021-03-16 DIAGNOSIS — J479 Bronchiectasis, uncomplicated: Secondary | ICD-10-CM | POA: Diagnosis not present

## 2021-03-16 DIAGNOSIS — J471 Bronchiectasis with (acute) exacerbation: Secondary | ICD-10-CM | POA: Diagnosis not present

## 2021-03-16 DIAGNOSIS — A310 Pulmonary mycobacterial infection: Secondary | ICD-10-CM | POA: Diagnosis not present

## 2021-03-16 DIAGNOSIS — R0600 Dyspnea, unspecified: Secondary | ICD-10-CM | POA: Diagnosis not present

## 2021-03-16 DIAGNOSIS — R0602 Shortness of breath: Secondary | ICD-10-CM | POA: Diagnosis not present

## 2021-03-16 MED ORDER — AMIKACIN IV (FOR PTA / DISCHARGE USE ONLY): 1000.0000 mg | INTRAVENOUS | 0 refills | Status: AC

## 2021-03-16 MED ORDER — TIGECYCLINE IV (FOR PTA / DISCHARGE USE ONLY): 50.0000 mg | INTRAVENOUS | 0 refills | Status: AC

## 2021-03-16 MED ORDER — SODIUM CHLORIDE 0.9 % IV SOLN: 12.0000 g | INTRAVENOUS | 0 refills | Status: AC

## 2021-03-16 MED ORDER — AMOXICILLIN-POT CLAVULANATE 875-125 MG PO TABS
1.0000 | ORAL_TABLET | Freq: Two times a day (BID) | ORAL | Status: DC
  Administered 2021-03-16 – 2021-03-20 (×9): 1 via ORAL
  Filled 2021-03-16 (×10): qty 1

## 2021-03-16 NOTE — Telephone Encounter (Signed)
Called and spoke with Dr. Chestine Spore, adv her of pt benefits with [employer] for PR. She stated she will pass the information to the pt care team. Also adv Dr. Chestine Spore where we are with scheduling PR.

## 2021-03-16 NOTE — Progress Notes (Signed)
PHARMACY CONSULT NOTE FOR:  OUTPATIENT  PARENTERAL ANTIBIOTIC THERAPY (OPAT)  Indication: Mycobacterium abscessus pulmonary infection  Regimen: Amikacin 1000 mg every MWF + Tigecycline 50 mg IV daily + Cefoxitin 12 gm every 24 hours as a continuous infusion End date: 05/08/21- Stop date tentatively set for 2 months and can re-assess in the clinic   IV antibiotic discharge orders are pended. To discharging provider:  please sign these orders via discharge navigator,  Select New Orders & click on the button choice - Manage This Unsigned Work.     Thank you for allowing pharmacy to be a part of this patient's care.  Sharin Mons, PharmD, BCPS, BCIDP Infectious Diseases Clinical Pharmacist Phone: [phone] 03/16/2021, 3:17 PM

## 2021-03-16 NOTE — Progress Notes (Signed)
Discussed patient's case with Dr. Chestine Spore, pulmonary and critical care and Dr. Drue Second, ID.  Pulmonary recommended for patient to be placed on Augmentin for additional organism coverage that could be contributing to patient's symptoms in addition to collection of sputum culture. Culture collected and patient recommended for discharge home with follow up regarding culture results.    Augmentin 875 every 12 hours ordered for 10 day course.   Ronnald Ramp, MD  [facility] Service, PGY-2  [facility]PTS Intern Pager [phone]

## 2021-03-16 NOTE — Telephone Encounter (Signed)
-----   Message from Judyann Munson, MD sent at 03/16/2021  3:23 PM EDT ----- Can she see dr Orvan Falconer in the next 1-2 wk for follow up to hospitalization

## 2021-03-16 NOTE — Progress Notes (Signed)
NAME:  Cola Gane, MRN:  [id number], DOB:  10/06/1956, LOS: 2 ADMISSION DATE:  03/11/2021, CONSULTATION DATE:  03/15/21 REFERRING MD:  Gwendlyn Deutscher, CHIEF COMPLAINT:  SOB  History of Present Illness:  Ms. 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 She is a never smoker.  Prior to having TB she had no previous pulmonary history of family history of lung disease. She has never felt that she was back to her pre-TB pulmonary status. She was diagnosed with NTM- M. Abscessus and M. Avium in 2021 and has been managed primarily by the Infectious Disease team. She has been on a multi- antibiotic regimen daily for MAC: rifampin, azithromycin, ethambutol, plus IV amikacin & tigecycline via PICC.  She had started IV imipenem but stopped it due to side DOE & fatigue. She has persistence of these organisms despite treatment for 2 months for MAC; she had just started treatment for M. Abscessus but has persistently been positive for 5 months. She has been evaluated at [facility] and was recommended an alternative antibiotic regimen and consideration for partial lobectomy after 2-3 months of treatment.   Over the 2 weeks prior to admission she experienced worsening DOE and development of yellow sputum, which was progressed from her usual clear, foamy sputum. Her symptoms worsened acutely 2 days PTA. She denies hemoptysis. No nausea, vomiting, or diarrhea. She had diarrhea on oral antibiotics that resolved with probiotic use. She has been on IV cefoxitin, tigecycline, oral azithromycin, ethambutol, and rifampin since admission.  Pulmonary has been consulted to assess if pulmonary rehab would be beneficial to her. Sputum cultures have not been obtained.    Pertinent  Medical History  History of pulmonary TB 2018, treated for 6 months in [place] Bronchiectasis HTN  Significant Hospital  Events: Including procedures, antibiotic start and stop dates in addition to other pertinent events   . 4/16 admit . 4/21 adding augmentin, sputum culture sent   Interim History / Subjective:  Still very SOB. Was able to produce sputum sample.  Objective   Blood pressure 106/61, pulse 80, temperature 98.3 F (36.8 C), resp. rate 16, height 5' 0.98" (1.549 m), weight 52.8 kg, SpO2 97 %.       No intake or output data in the 24 hours ending 03/16/21 1533 Filed Weights   03/12/21 0311 03/14/21 2348  Weight: 52.8 kg 52.8 kg    Examination: General: middle aged woman sitting up in the chair in NAD HENT: [place]/AT, eyes anicteric Lungs: faint bibasilar rhales, inspiratory wheezing LUL, speaking in full sentences, breathing comfortably on RA Cardiovascular: S1S2, RRR Abdomen: thin, soft, ND Extremities: no edema, no cyanosis Neuro: awake, alert, answering questions appropriately, moving all extremities spontaneously Derm: PICC RUE, skin warm and dry  Labs/imaging that I have personally reviewed  (right click and "Reselect all SmartList Selections" daily)   Sputum sample pending   Resolved Hospital Problem list     Assessment & Plan:  Acute bronchiectasis exacerbation vs progressive NTM infection with chronic bronchiectasis M. Abscessus and MAC infections chronically Side effects of chronic antibiotic therapy -Sputum culture pending -recommend adding broad coverage for typical organisms as most AE of bronchiectasis tend to be due to non-NTM organisms. Would need to change from augmentin if sputum culture grows pseudomonas. Recommend 10-14 days treatment per ERS & BTS recommendations.  -Con't NTM antibiotics per ID.   -I think she would be a  great candidate for pulmonary rehab -- she has insurance coverage for this but the [facility] program is backlogged on starting new people and will likely not be able to enroll her until June- July 2022. She can exercise at home and we discussed she  can push herself with exercise as long as she is not lightheaded or dizzy or developing  -Airway clearance therapy regimen- continue hypertonic saline + flutter BID.   Best practice (right click and "Reselect all SmartList Selections" daily)  Per primary  Labs   CBC: Recent Labs  Lab 03/11/21 1438 03/12/21 0500 03/13/21 1225  WBC 6.0 6.6 5.8  HGB 14.1 13.3 12.7  HCT 41.9 38.7 37.5  MCV 93.3 90.0 93.3  PLT 257 241 155    Basic Metabolic Panel: Recent Labs  Lab 03/11/21 1438 03/12/21 0500 03/13/21 1135 03/15/21 0525  NA 128* 135 133* 133*  K 3.4* 4.3 3.8 3.9  CL 95* 99 97* 100  CO2 _0 GLUCOSE 121* 94 95 85  BUN 11 7* 12 12  CREATININE 0.67 0.71 0.67 0.74  CALCIUM 8.8* 9.1 8.9 8.3*    Julian Hy, DO 03/16/21 4:06 PM [place]

## 2021-03-16 NOTE — Progress Notes (Signed)
Family Medicine Teaching Service Daily Progress Note Intern Pager: [phone]  Patient name: Victoria Mendoza Medical record number: [id number] Date of birth: August 24, 1956 Age: 65 y.o. Gender: female  Primary Care Provider: Patient, No Pcp Per (Inactive) Consultants: ID, pulmonology Code Status: DNR  Pt Overview and Major Events to Date:  4/16 admitted  Antibiotic history Azithromycin 04/01/2020-c Ethambutol 04/21/2020-c Rifampin 04/21/2020-c  IV amikacin 02/06/2021- 3/19, 3/28 -c IV Tigecycline 02/06/21-3/19, 3/22 - 4/12, 4/15-c IV Imipenem 02/06/21-02/14/21 IV Cefoxitin 4/18-21  Assessment and Plan: [name] a 65 y.o.femalepresenting with worsening shortness of breath. PMH is significant forMycobacterium(MAC and abscessus)infection followed by ID,hypertension, bronchiectasis,and previous tuberculosis.  Dyspnea on exertion, in setting of known MAC/M. Abscessus: Subacute, stable. Improved/reduced wheezing on exam. Patient subjectively feels less short of breath overnight and today. Pulm consulted, recommends repeat sputum culture today. Pulm ordered hypertonic saline inhaled treatment, patient reports poor response to it as described below in subjective.  - ID on board, appreciate ongoing care - Pulm consulted, plan for sputum culture today - Pulm to coordinate outpatient pulmonary rehab -Albuterol inhaler as needed, assess response - continue tussionex 5 mL q12 -Incentive spirometry every hour -Oxygen if needed -Continue antibiotic regimen as follows: -P.o. azithromycin 250 mg daily -P.o. ethambutol 1000 mg daily -P.o. rifampin 600 mg daily -IV amikacin 1000 mg MWF - IV tigecycline 50 mg daily  - IV cefoxitin 12g daily   Hypertension: Chronic, stable. Normotensive last 24 hours while holding home bisoprolol-HCTZ.  - continue BP monitoring  Hyponatremia: Chronic, improved. Na 133 yesterday, stable from  4/18. Holding HCTZ as above. Next BMP tomorrow. - monitor Na with BMPevery 48 hours  Bilateral peripheral neuropathy: Chronic. ID does not believe this would be side effect of her antibiotic regimen. ID suggests possible spinal imaging. Patient reports being stiff and tingly in the morning. No saddle anesthesia, incontinence, difficulty ambulating. Able to ambulate, stretch, etc without trouble from legs. Takes B6 at home. B12 measured during admission is normal.  - monitor - recommend follow outpatient  Previous tuberculosis: Resolved, stable. Several years ago and most recently in 2018, fully treated.   FEN/GI: regular PPx: lovenox   Status is: Inpatient  Remains inpatient appropriate because:Ongoing diagnostic testing needed not appropriate for outpatient work up and IV treatments appropriate due to intensity of illness or inability to take PO   Dispo:  Patient From: Home  Planned Disposition: Home with Health Care Svc  Medically stable for discharge: No     Subjective:  Patient found laying comfortably in bed. Reports she has been feeling better overnight in terms of SOB. Improved SOB. Doesn't know why she is better, but thankful. Reports hypertonic saline from pulm yesterday made her feel very weak in her arms and legs, also "crummy". She curled up in the recliner and stayed there for "hours" yesterday. Does not want to repeat hypertonic saline treatment.   Objective: Temp:  [98 F (36.7 C)-98.4 F (36.9 C)] 98.3 F (36.8 C) (04/21 0630) Pulse Rate:  [80-98] 80 (04/21 0630) Resp:  [16] 16 (04/21 0630) BP: (106-127)/(61-77) 106/61 (04/21 0630) SpO2:  [96 %-97 %] 97 % (04/21 0630) Physical Exam: General: awake, alert, oriented, NAD Cardiovascular: RRR, no murmur Respiratory: mild wheezing in superior fields ant/post, good air movement Extremities: moving all spontaneously  Laboratory: Recent Labs  Lab 03/11/21 1438 03/12/21 0500 03/13/21 1225  WBC 6.0 6.6  5.8  HGB 14.1 13.3 12.7  HCT 41.9 38.7 37.5  PLT 257 241 250   Recent Labs  Lab  03/12/21 0500 03/13/21 1135 03/15/21 0525  NA 135 133* 133*  K 4.3 3.8 3.9  CL 99 97* 100  CO2 _0 BUN 7* 12 12  CREATININE 0.71 0.67 0.74  CALCIUM 9.1 8.9 8.3*  PROT 6.6 7.0  --   BILITOT 0.6 0.5  --   ALKPHOS 69 68  --   ALT 22 22  --   AST 24 23  --   GLUCOSE 94 95 85    Imaging/Diagnostic Tests: None last 24 hours.   Ezequiel Essex, MD 03/16/2021, 11:37 AM PGY-1, [place] Intern pager: [phone], text pages welcome

## 2021-03-16 NOTE — Progress Notes (Signed)
    [facility] for Infectious Disease    Date of Admission:  03/11/2021      ID: Victoria Mendoza is a 65 y.o. female with  MAI/M.abscessus lung disease, bronchiectasis admitted for shortness of breath, abtx intolerance Active Problems:   Mycobacterium avium complex (HCC)   SOB (shortness of breath)    Subjective: She reports tolerating abtx. Has mild productive cough, whitish sputum. Still concern that she notices her feet become discolored (purple) when she stands as well as numbness to her great toes(bilaterally) Medications:  . acetylcysteine  600 mg Oral BID  . azithromycin  250 mg Oral Daily  . Chlorhexidine Gluconate Cloth  6 each Topical Daily  . enoxaparin (LOVENOX) injection  40 mg Subcutaneous Q24H  . ethambutol  1,000 mg Oral Daily  . rifampin  600 mg Oral q morning  . sodium chloride HYPERTONIC  4 mL Nebulization BID  . vitamin B-6  25 mg Oral Daily    Objective: Vital signs in last 24 hours: Temp:  [98.3 F (36.8 C)-98.4 F (36.9 C)] 98.3 F (36.8 C) (04/21 0630) Pulse Rate:  [80-98] 80 (04/21 0630) Resp:  [16] 16 (04/21 0630) BP: (106-127)/(61-71) 106/61 (04/21 0630) SpO2:  [96 %-97 %] 97 % (04/21 0630)  Physical Exam  Constitutional:  oriented to person, place, and time. appears well-developed and well-nourished. No distress.  HENT: [place]/AT, PERRLA, no scleral icterus Mouth/Throat: Oropharynx is clear and moist. No oropharyngeal exudate.  Cardiovascular: Normal rate, regular rhythm and normal heart sounds. Exam reveals no gallop and no friction rub.  No murmur heard.  Pulmonary/Chest: Effort normal and breath sounds abn- exp wheeze noted bilaterally Neck = supple, no nuchal rigidity Abdominal: Soft. Bowel sounds are normal.  exhibits no distension. There is no tenderness.  Lymphadenopathy: no cervical adenopathy. No axillary adenopathy Neurological: alert and oriented to person, place, and time.  Skin: Skin is warm and dry. No rash noted. No erythema.   Psychiatric: a normal mood and affect.  behavior is normal.    Lab Results Recent Labs    03/15/21 0525  NA 133*  K 3.9  CL 100  CO2 29  BUN 12  CREATININE 0.74   Liver Panel No results for input(s): PROT, ALBUMIN, AST, ALT, ALKPHOS, BILITOT, BILIDIR, IBILI in the last 72 hours. Sedimentation Rate No results for input(s): ESRSEDRATE in the last 72 hours. C-Reactive Protein No results for input(s): CRP in the last 72 hours.  Microbiology:  Studies/Results: No results found.   Assessment/Plan: M.abscessus pulm disease = plan to continue with amikacin IV, tigecycline IV, and continuous infusion of cefoxitin IV (did not tolerate imipenem). Will check ami trough tonight (last night's sample was mislabeled, and thus not processed)  MAI pulm disease = continue on azithro ,emb, plus rifampin  Questionable exacerbation of bronchiectasis = sputum culture has not been sent off this morning  Lower extremity discoloration = potential this is early chronic venous stasis. Can consider doing vascular studies while she is here vs outpatient follow up  [facility] for Infectious Diseases Cell: [phone] Pager: [phone]  03/16/2021, 1:22 PM

## 2021-03-16 NOTE — Telephone Encounter (Signed)
Patient has follow up scheduled for 03/28/21 with Dr. Orvan Falconer.   Sandie Ano, RN

## 2021-03-16 NOTE — Telephone Encounter (Signed)
Pt insurance is active and benefits verified through [facility]. Co-pay $0.00, DED $0.00/$0.00 met, out of pocket $1,500.00/$754.51 met, co-insurance 0%. No pre-authorization required. Christine, 03/16/21 @ 1:24PM, QMV#[id number]

## 2021-03-17 DIAGNOSIS — A319 Mycobacterial infection, unspecified: Secondary | ICD-10-CM | POA: Diagnosis not present

## 2021-03-17 DIAGNOSIS — J479 Bronchiectasis, uncomplicated: Secondary | ICD-10-CM | POA: Diagnosis not present

## 2021-03-17 DIAGNOSIS — R0602 Shortness of breath: Secondary | ICD-10-CM | POA: Diagnosis not present

## 2021-03-17 DIAGNOSIS — A310 Pulmonary mycobacterial infection: Secondary | ICD-10-CM | POA: Diagnosis not present

## 2021-03-17 LAB — BASIC METABOLIC PANEL
Anion gap: 5 (ref 5–15)
BUN: 10 mg/dL (ref 8–23)
CO2: 29 mmol/L (ref 22–32)
Calcium: 8.7 mg/dL — ABNORMAL LOW (ref 8.9–10.3)
Chloride: 100 mmol/L (ref 98–111)
Creatinine, Ser: 0.85 mg/dL (ref 0.44–1.00)
GFR, Estimated: >60 mL/min (ref >60)
Glucose, Bld: 90 mg/dL (ref 70–99)
Potassium: 4.2 mmol/L (ref 3.5–5.1)
Sodium: 134 mmol/L — ABNORMAL LOW (ref 135–145)

## 2021-03-17 LAB — EXPECTORATED SPUTUM ASSESSMENT W GRAM STAIN, RFLX TO RESP C

## 2021-03-17 MED ORDER — HYDROXYZINE HCL 25 MG PO TABS: 25.0000 mg | ORAL_TABLET | ORAL | Status: DC | PRN

## 2021-03-17 NOTE — Progress Notes (Signed)
Family Medicine Teaching Service Daily Progress Note Intern Pager: [phone]  Patient name: Victoria Mendoza Medical record number: [id number] Date of birth: 1956-03-30 Age: 65 y.o. Gender: female  Primary Care Provider: Patient, No Pcp Per (Inactive) Consultants: ID (s/o), pulm (s/o) Code Status: DNR  Pt Overview and Major Events to Date:  4/16 admitted 4/22 ID and pulmonology signed off  Antibiotic history Azithromycin5/05/2020-c Ethambutol 04/21/2020-c Rifampin 04/21/2020-c IV amikacin 02/06/2021- 3/19, 3/28 -c IV Tigecycline 02/06/21-3/19, 3/22 - 4/12, 4/15-c IV Imipenem 02/06/21-02/14/21 IV Cefoxitin 4/18-21  Assessment and Plan: Victoria Mendoza a 65 y.o.femalepresenting with worsening shortness of breath. PMH is significant forMycobacterium(MAC and abscessus)infection followed by ID,hypertension, bronchiectasis,and previous tuberculosis.  Dyspnea on exertion, in setting of known MAC/M. Abscessus: Subacute, stable. Improved lung sounds today, no wheezing auscultated.  ID signed off, has coordinated resumption of her IV antibiotics at home via port/PICC.  Pulmonology signed off, saying they are happy with her on the Augmentin and that if her sputum culture comes likely Pseudomonas, they will simply call her and adjust her antibiotics.  She has very close follow-up with pulmonology clinic. -ID and pulmonology signed off - Home health consulted, agency will need to procure correct IV pump for this new antibiotic and cannot have it to her home until Monday - Albuterol inhaler as needed - Continue Desenex - Continue flutter valve for congestion - Incentive spirometry every hour - O2 as needed, has not needed for quite some time -Medical air for comfort as needed -Continue antibiotic regimen as follows: -P.o. azithromycin 250 mg daily -P.o. ethambutol 1000 mg daily -P.o. rifampin 600 mg daily -IV amikacin 1000 mg  MWF - IV tigecycline 50 mg daily             - IV cefoxitin 12g daily   Hypertension: Chronic, stable. Patient continues to be normotensive. - Vitals.  Routine - Continue holding home bisoprolol-HCTZ - We will recommend discontinuation of bisoprolol-HCTZ on discharge, she has been doing well without it  Bilateral peripheral neuropathy: Chronic. Stable.  Takes B6 at home.  B12 during admission normal at 840.  Will recommend outpatient follow-up.  Previous tuberculosis: Resolved, stable. Several years ago and most recently in 2018, fully treated.   FEN/GI: Regular PPx: Lovenox, has been refusing but ambulating well   Status is: Inpatient  Remains inpatient appropriate because:IV treatments appropriate due to intensity of illness or inability to take PO and awaiting coordination for correct antibiotic pump at home   Dispo:  Patient From: Home  Planned Disposition: Home with Health Care Svc  Medically stable for discharge: No      Subjective:  Patient found sitting in recliner with nurse at bedside.  Patient sitting upright with feet crossed under her.  She appears comfortable and has a bright affect.  She reports having a little less shortness of breath overnight.  She last night looked up on Google what Exercises pulmonary rehab does.  Says that she has been doing them 3 times yesterday and feel that she can do this on her own at home.  She is not so sure about pulmonary rehab now because she says it is a great sure for her to get up and out of the house and to pulmonary rehab.  She says she can do it at home.  Objective: Temp:  [97.8 F (36.6 C)-97.9 F (36.6 C)] 97.9 F (36.6 C) (04/22 1550) Pulse Rate:  [99-100] 100 (04/22 1550) Resp:  [16-20] 20 (04/22 1550) BP: (126-137)/(74-89) 126/74 (04/22 1550) SpO2:  [  95 %-97 %] 97 % (04/22 1550) Physical Exam: General: Awake, alert, no acute distress Cardiovascular: Regular rate and rhythm, no  murmurs Respiratory: CTA B Extremities: Moving all spontaneously, no focal deficits  Laboratory: Recent Labs  Lab 03/11/21 1438 03/12/21 0500 03/13/21 1225  WBC 6.0 6.6 5.8  HGB 14.1 13.3 12.7  HCT 41.9 38.7 37.5  PLT 257 241 250   Recent Labs  Lab 03/12/21 0500 03/13/21 1135 03/15/21 0525 03/17/21 0530  NA 135 133* 133* 134*  K 4.3 3.8 3.9 4.2  CL 99 97* 100 100  CO2 _0 BUN 7* _1 CREATININE 0.71 0.67 0.74 0.85  CALCIUM 9.1 8.9 8.3* 8.7*  PROT 6.6 7.0  --   --   BILITOT 0.6 0.5  --   --   ALKPHOS 69 68  --   --   ALT 22 22  --   --   AST 24 23  --   --   GLUCOSE 94 95 85 90    Imaging/Diagnostic Tests: None last 24 hours.  Victoria Essex, MD 03/17/2021, 6:21 PM PGY-1, [place]  Intern pager: [phone], text pages welcome

## 2021-03-17 NOTE — Evaluation (Signed)
Physical Therapy Evaluation and Discharge Patient Details Name: Victoria Mendoza MRN: [id number] DOB: [birth year] Today's Date: 03/17/2021   History of Present Illness  65 y.o. female presented 03/11/21 with worsening shortness of breath. PMH is significant for Mycobacterium AVM pna with lung abscesses followed by ID (and awaiting consult with [facility] thoracic surgeon for posible lung resection), hypertension, bronchiectasis, previous tuberculosis, bil peripheral neuropathy  Clinical Impression  Pt was seen for review of functional mobility, and noted her RLE had a bit of weakness and was demonstrating a challenge in setting limits for exercise at home.  Contacted discharge planning team to share that pt can move but does not retain the information about her lungs, and did reinforce with her that lungs were not going to heal over exercise, but rather will make strength and ROM of legs better to maintain her endurance.  Follow up with home therapy and acute PT will discharge for now given that pt needs guidelines for home. Home health PT will work on RLE strength and endurance given that she is medically ready to go home.     Follow Up Recommendations Home health PT    Equipment Recommendations  None recommended by PT    Recommendations for Other Services       Precautions / Restrictions Precautions Precautions: Other (comment) (ck HR and sats with exercise) Restrictions Weight Bearing Restrictions: No      Mobility  Bed Mobility Overal bed mobility: Independent                  Transfers Overall transfer level: Independent               General transfer comment: uses safe hand placement on chair  Ambulation/Gait Ambulation/Gait assistance: Modified independent (Device/Increase time) Gait Distance (Feet): 60 Feet Assistive device: None Gait Pattern/deviations: Step-through pattern;Narrow base of support Gait velocity: controlled Gait velocity interpretation: <1.31 ft/sec,  indicative of household ambulator General Gait Details: HR was 110 max with gait and sat 95% or better on room air  Stairs            Wheelchair Mobility    Modified Rankin (Stroke Patients Only)       Balance Overall balance assessment: Independent                                           Pertinent Vitals/Pain Pain Assessment: No/denies pain    Home Living Family/patient expects to be discharged to:: Private residence Living Arrangements: Spouse/significant other Available Help at Discharge: Family;Available 24 hours/day Type of Home: House         Home Equipment: None      Prior Function Level of Independence: Independent         Comments: has been training with weights and doing endurance work, gait without an AD.  Pt is under the impression her lungs will heal from doing exercise     Hand Dominance   Dominant Hand: Right    Extremity/Trunk Assessment   Upper Extremity Assessment Upper Extremity Assessment: Overall WFL for tasks assessed    Lower Extremity Assessment Lower Extremity Assessment: RLE deficits/detail RLE Deficits / Details: 4+ RLE strength    Cervical / Trunk Assessment Cervical / Trunk Assessment: Normal  Communication   Communication: No difficulties  Cognition Arousal/Alertness: Awake/alert Behavior During Therapy: Anxious Overall Cognitive Status: Within Functional Limits for tasks assessed (has been distracted  during session and requires repetition with information)                                        General Comments General comments (skin integrity, edema, etc.): Pt is controlled for sats and HR with in room exercise and talked about her limits with more strenuous exercise    Exercises     Assessment/Plan    PT Assessment Patient needs continued PT services  PT Problem List Cardiopulmonary status limiting activity       PT Treatment Interventions Other (comment)    PT  Goals (Current goals can be found in the Care Plan section)  Acute Rehab PT Goals Patient Stated Goal: be able to improve her lungs as much as possible prior to surgery PT Goal Formulation: All assessment and education complete, DC therapy Time For Goal Achievement: 03/17/21 Potential to Achieve Goals: Good    Frequency Other (Comment)   Barriers to discharge   SOB with extensive effort, does not understand how to limit herslef    Co-evaluation               AM-PAC PT "6 Clicks" Mobility  Outcome Measure Help needed turning from your back to your side while in a flat bed without using bedrails?: None Help needed moving from lying on your back to sitting on the side of a flat bed without using bedrails?: None Help needed moving to and from a bed to a chair (including a wheelchair)?: None Help needed standing up from a chair using your arms (e.g., wheelchair or bedside chair)?: None Help needed to walk in hospital room?: None Help needed climbing 3-5 steps with a railing? : A Little 6 Click Score: 23    End of Session   Activity Tolerance: Treatment limited secondary to medical complications (Comment) (SOB with greater effort) Patient left: in chair;with call bell/phone within reach Nurse Communication: Mobility status;Other (comment) PT Visit Diagnosis: Other (comment)    Time: 7322-0254 PT Time Calculation (min) (ACUTE ONLY): 30 min   Charges:   PT Evaluation $PT Eval Moderate Complexity: 1 Mod PT Treatments $Gait Training: 8-22 mins       Ivar Drape 03/17/2021, 2:35 PM Samul Dada, PT MS Acute Rehab Dept. Number: [facility] [id number] and [facility] [phone]

## 2021-03-17 NOTE — TOC Progression Note (Addendum)
Transition of Care [facility]) - Progression Note    Patient Details  Name: Victoria Mendoza MRN: [id number] Date of Birth: Feb 09, 1956  [facility]) CM/SW Contact  Beckie Busing, RN Phone Number: [phone]  03/17/2021, 1:07 PM  Clinical Narrative:    CM received call from family practice clinic. Patient inquiry about home health. CM at bedside. Patient states that she does not feel that she will be able to manage to take care of herself at home due to SOB that prohibits her from ADL's. Patient states that she has no family or friends that are able to assist her. CM has notified MD and obtained permission to have PT to reevaluate patient to determine disposition.   1300 CM received call from Vernona Rieger with [facility]. Per Vernona Rieger patient has a new med that is to infuse via ambulatory pump and this is new for patient. [facility] agency [facility] will need to initiate teaching because the patient has not had to administer via this method (ambulatory pump). The agency can not initiate teaching until Monday. Resumption of services can not begin until Monday for this patient. MD has been updated.         Expected Discharge Plan and Services                                                 Social Determinants of Health (SDOH) Interventions    Readmission Risk Interventions No flowsheet data found.

## 2021-03-18 DIAGNOSIS — J479 Bronchiectasis, uncomplicated: Secondary | ICD-10-CM | POA: Diagnosis not present

## 2021-03-18 DIAGNOSIS — R0602 Shortness of breath: Secondary | ICD-10-CM | POA: Diagnosis not present

## 2021-03-18 DIAGNOSIS — A310 Pulmonary mycobacterial infection: Secondary | ICD-10-CM | POA: Diagnosis not present

## 2021-03-18 DIAGNOSIS — A319 Mycobacterial infection, unspecified: Secondary | ICD-10-CM | POA: Diagnosis not present

## 2021-03-18 LAB — CREATININE, SERUM
Creatinine, Ser: 0.8 mg/dL (ref 0.44–1.00)
GFR, Estimated: >60 mL/min (ref >60)

## 2021-03-18 LAB — AMIKACIN, TROUGH: Amikacin Tr: <0.8 ug/mL — ABNORMAL LOW (ref 1.0–8.0)

## 2021-03-18 NOTE — Progress Notes (Signed)
Family Medicine Teaching Service Daily Progress Note Intern Pager: [phone]  Patient name: Victoria Mendoza Medical record number: [id number] Date of birth: [birth year] Age: 65 y.o. Gender: female  Primary Care Provider: Patient, No Pcp Per (Inactive) Consultants: ID (s/o), pulm (s/o) Code Status: DNR  Pt Overview and Major Events to Date:  4/16 admitted 4/22 ID and pulmonology signed off  Antibiotic history Azithromycin5/05/2020-c Ethambutol 04/21/2020-c Rifampin 04/21/2020-c IV amikacin 02/06/2021- 3/19, 3/28 -c IV Tigecycline 02/06/21-3/19, 3/22 - 4/12, 4/15-c IV Imipenem 02/06/21-02/14/21 IV Cefoxitin 4/18-21  Assessment and Plan: Victoria Mendoza a 65 y.o.femalepresenting with worsening shortness of breath. PMH is significant forMycobacterium(MAC and abscessus)infection followed by ID,hypertension, bronchiectasis,and previous tuberculosis.  Dyspnea on exertion, in setting of known MAC/M. abscessus: Subacute, stable. Lung sounds stable, minor wheezing today the patient moving air well. ID signed off, has coordinated resumption of her IV antibiotics at home via port/PICC.  Pulmonology signed off, saying they are happy with her on the Augmentin and that if her sputum culture comes likely Pseudomonas, they will simply call her and adjust antibiotics. She has very close follow-up with pulmonology clinic. -ID and pulmonology signed off - Home health consulted, agency will need to procure correct IV pump for this new antibiotic and cannot have it to her home until Monday (April 25) - Albuterol inhaler as needed - Continue Desenex - Continue flutter valve for congestion - Incentive spirometry every hour - O2 as needed, has not needed for quite some time -Medical air for comfort as needed -Continue antibiotic regimen as follows: -P.o. azithromycin 250 mg daily -P.o. ethambutol 1000 mg daily -P.o. rifampin 600 mg daily -IV amikacin  1000 mg MWF - IV tigecycline 50 mg daily             - IV cefoxitin 12g daily  --Recommend patient to be ambulated on the floor to see how she does with shortness of breath; however, this is difficult with her MAC.  Can continue to ambulate in the room.  Hypertension: Chronic, stable. Patient continues to be normotensive. - Vitals.  Routine - Continue holding home bisoprolol-HCTZ - We will recommend discontinuation of bisoprolol-HCTZ on discharge, she has been doing well without it  Bilateral peripheral neuropathy: Chronic. Stable.  Takes B6 at home.  B12 during admission normal at 840.  Will recommend outpatient follow-up.  Previous tuberculosis: Resolved, stable. Several years ago and most recently in 2018, fully treated.  FEN/GI: Regular PPx: Lovenox, has been refusing but ambulating well  Status is: Inpatient  Remains inpatient appropriate because:IV treatments appropriate due to intensity of illness or inability to take PO and awaiting coordination for correct antibiotic pump at home  Dispo:  Patient From: Home  Planned Disposition: Home with Health Care Svc  Medically stable for discharge: No    Subjective:  Patient resting comfortably in bed this morning, says she slept well last night.  States she is not currently anxious.  Very pleasant this morning.  Has no questions or concerns.  Objective: Temp:  [97.6 F (36.4 C)-98.5 F (36.9 C)] 98.5 F (36.9 C) (04/23 0508) Pulse Rate:  [78-100] 78 (04/23 0508) Resp:  [16-20] 16 (04/23 0508) BP: (107-126)/(57-74) 107/57 (04/23 0508) SpO2:  [93 %-97 %] 93 % (04/23 0508) Physical Exam: General: Awake, alert, no acute distress Cardiovascular: Regular rate and rhythm, no murmurs Respiratory: Comfortable work of breathing on room air, moving air well, minor wheeze appreciated to lower lobe. Extremities: Moving all spontaneously, no focal deficits  Laboratory: Recent Labs  Lab 03/11/21 1438  03/12/21 0500  03/13/21 1225  WBC 6.0 6.6 5.8  HGB 14.1 13.3 12.7  HCT 41.9 38.7 37.5  PLT 257 241 250   Recent Labs  Lab 03/12/21 0500 03/13/21 1135 03/15/21 0525 03/17/21 0530 03/18/21 0154  NA 135 133* 133* 134*  --   K 4.3 3.8 3.9 4.2  --   CL 99 97* 100 100  --   CO2 _0 --   BUN 7* _1 --   CREATININE 0.71 0.67 0.74 0.85 0.80  CALCIUM 9.1 8.9 8.3* 8.7*  --   PROT 6.6 7.0  --   --   --   BILITOT 0.6 0.5  --   --   --   ALKPHOS 69 68  --   --   --   ALT 22 22  --   --   --   AST 24 23  --   --   --   GLUCOSE 94 95 85 90  --     Imaging/Diagnostic Tests: None last 24 hours.  Daisy Floro, DO 03/18/2021, 5:37 AM PGY-3, [place] Intern pager: [phone], text pages welcome

## 2021-03-19 DIAGNOSIS — F419 Anxiety disorder, unspecified: Secondary | ICD-10-CM

## 2021-03-19 DIAGNOSIS — A319 Mycobacterial infection, unspecified: Secondary | ICD-10-CM | POA: Diagnosis not present

## 2021-03-19 DIAGNOSIS — J479 Bronchiectasis, uncomplicated: Secondary | ICD-10-CM | POA: Diagnosis not present

## 2021-03-19 DIAGNOSIS — R0602 Shortness of breath: Secondary | ICD-10-CM | POA: Diagnosis not present

## 2021-03-19 DIAGNOSIS — A310 Pulmonary mycobacterial infection: Secondary | ICD-10-CM | POA: Diagnosis not present

## 2021-03-19 LAB — BASIC METABOLIC PANEL
Anion gap: 6 (ref 5–15)
BUN: 14 mg/dL (ref 8–23)
CO2: 28 mmol/L (ref 22–32)
Calcium: 8.6 mg/dL — ABNORMAL LOW (ref 8.9–10.3)
Chloride: 98 mmol/L (ref 98–111)
Creatinine, Ser: 0.77 mg/dL (ref 0.44–1.00)
GFR, Estimated: >60 mL/min (ref >60)
Glucose, Bld: 91 mg/dL (ref 70–99)
Potassium: 4 mmol/L (ref 3.5–5.1)
Sodium: 132 mmol/L — ABNORMAL LOW (ref 135–145)

## 2021-03-19 LAB — CULTURE, RESPIRATORY W GRAM STAIN: Culture: NORMAL

## 2021-03-19 MED ORDER — ACETYLCYSTEINE 20 % IN SOLN
600.0000 mg | Freq: Two times a day (BID) | RESPIRATORY_TRACT | Status: DC
  Filled 2021-03-19 (×3): qty 4

## 2021-03-19 MED ORDER — ACETYLCYSTEINE 20 % IN SOLN
600.0000 mg | Freq: Two times a day (BID) | RESPIRATORY_TRACT | Status: DC
  Filled 2021-03-19: qty 4

## 2021-03-19 MED ORDER — ALBUTEROL SULFATE (2.5 MG/3ML) 0.083% IN NEBU
2.5000 mg | INHALATION_SOLUTION | RESPIRATORY_TRACT | 12 refills | Status: AC | PRN
  Filled 2021-03-19: qty 90, 10d supply, fill #0

## 2021-03-19 MED ORDER — PULSE OXIMETER MISC
1.0000 [IU] | 0 refills | Status: AC | PRN
  Filled 2021-03-19: qty 1, fill #0

## 2021-03-19 NOTE — Progress Notes (Signed)
Family Medicine Teaching Service Daily Progress Note Intern Pager: [phone]  Patient name: Victoria Mendoza Medical record number: [id number] Date of birth: [birth year] Age: 65 y.o. Gender: female  Primary Care Provider: Patient, No Pcp Per (Inactive) Consultants: ID (s/o), pulm (s/o) Code Status: DNR  Pt Overview and Major Events to Date:  4/16 admitted 4/22 ID and pulmonology signed off 4/25 expected discharge once TOC and HH able to procure correct IV pump for home  Antibiotic history Azithromycin5/05/2020-c Ethambutol 04/21/2020-c Rifampin 04/21/2020-c Augmentin 03/16/2021-c IV amikacin 02/06/2021- 3/19, 3/28 -c IV Tigecycline 02/06/21-3/19, 3/22 - 4/12, 4/15-c IV Imipenem 02/06/21-02/14/21 IV Cefoxitin 4/18-21  Assessment and Plan: Victoria Mendoza a 65 y.o.femalepresenting with worsening shortness of breath. PMH is significant forMycobacterium(MAC and abscessus)infection followed by ID,hypertension, bronchiectasis,and previous tuberculosis.  Dyspnea on exertion, in setting of known MAC/M. abscessus: Subacute, stable. Patient reports doing well overnight. No SOB overnight, says SOB occurs only during the day and with exertion. Discussed albuterol inhaler for wheezing and pulse oximeter for SOB upon discharge for home use. Working with SW/TOC to obtain correct IV infusion pump for home use with new antibiotic and also set her up with PCP.  - ID and pulmonology signed off - Home health consulted, agency will need to procure correct IV pump for this new antibiotic and cannot have it to her home until Monday (April 25) - Albuterol inhaler PRN - Continue Tussonex - Continue flutter valve for congestion - Incentive spirometryq1hr - Medical air for comfort PRN -Continue antibiotic regimen as follows: -P.o. azithromycin 250 mg daily -P.o. ethambutol 1000 mg daily -P.o. rifampin 600 mg daily -IV amikacin 1000 mg  MWF -IV tigecycline 50 mg daily -IV cefoxitin 12g daily  - augmentin 875-125 mg q12 - continue to ambulate in room  Hypertension: Chronic, stable. Normotensive. One soft BP recorded early yesterday morning. Holding home antihypertensive.   Bilateral peripheral neuropathy: Chronic. Stable.  Takes B6 at home.  B12 during admission normal at 840.  Will recommend outpatient follow-up.  Previous tuberculosis: Resolved, stable. Several years ago and most recently in 2018, fully treated.   FEN/GI: regular PPx: lovenox, ambulating well   Status is: Inpatient  Remains inpatient appropriate because:awaiting HH equipment   Dispo:  Patient From: Home  Planned Disposition: Home with Health Care Svc  Medically stable for discharge: No     Subjective:  Patient reports doing well overnight. No SOB overnight, says SOB occurs only during the day and with exertion. Discussed albuterol inhaler for wheezing and pulse oximeter for SOB upon discharge for home use.   Objective: Temp:  [98.2 F (36.8 C)-98.6 F (37 C)] 98.6 F (37 C) (04/24 0753) Pulse Rate:  [81-100] 95 (04/24 0753) Resp:  [14-18] 14 (04/24 0753) BP: (109-131)/(61-76) 121/70 (04/24 0753) SpO2:  [95 %-98 %] 98 % (04/24 0753) Physical Exam: General: awake, alert, oriented, NAD Cardiovascular: RRR no murmur Respiratory: slight wheezes heard in posterior right lower lobe Extremities: moving all extremities  Laboratory: Recent Labs  Lab 03/13/21 1225  WBC 5.8  HGB 12.7  HCT 37.5  PLT 250   Recent Labs  Lab 03/13/21 1135 03/15/21 0525 03/17/21 0530 03/18/21 0154 03/19/21 0258  NA 133* 133* 134*  --  132*  K 3.8 3.9 4.2  --  4.0  CL 97* 100 100  --  98  CO2 _0 --  28  BUN _1 --  14  CREATININE 0.67 0.74 0.85 0.80 0.77  CALCIUM 8.9 8.3* 8.7*  --  8.6*  PROT 7.0  --   --   --   --   BILITOT 0.5  --   --   --   --   ALKPHOS 68  --   --   --   --   ALT 22  --   --    --   --   AST 23  --   --   --   --   GLUCOSE 95 85 90  --  91   Imaging/Diagnostic Tests: None last 24 hours.   Ezequiel Essex, MD 03/19/2021, 10:03 AM PGY-1, [place] Intern pager: [phone], text pages welcome

## 2021-03-19 NOTE — Hospital Course (Addendum)
Victoria Mendoza is a 65 y.o. female presenting with worsening shortness of breath. 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  Pt presents with progressive dyspnea on exertion for the past several weeks, with subsequent worsening over the past several days, associated with yellow sputum (not increased in production or purulence).  Afebrile and breathing comfortably at rest, diffuse expiratory rhonchi/occasional wheeze on exam.  No hypoxia or leukocytosis.  Unclear etiology and may be multifactorial.  Currently follows with ID with regimen including p.o. azithromycin, ethambutol, rifampin, and IV amikacin/IV tigecycline, however patient recently self-discontinued tigecycline d/t concern it was causing her SOB.  CT showed stable cavitary lesions.  No PE or PNA on CTA.  COVID/flu negative.  Troponin negative at 5>8 without ischemic EKG changes (some non-specific T wave changes), doubt underlying MI/angina. As she otherwise is hemodynamically stable. ID consulted. Antibiotic summary below. Pulmonology consulted to evaluate patient for pulmonary rehab, obtained sputum culture. Pulm started patient on Augmentin with improvement in SOB symptoms. Ambulation with pulse ox demonstrated normal SpO2 during all activity. Patient discharged on four oral medications: augmentin, azithromycin, ethambutol, and rifampin. Also discharged on three IV medications: amikacin, tigecycline, cefoxitin.   Antibiotic history Azithromycin 04/01/2020-c (expected duration through June 13) Ethambutol 04/21/2020-c (expected duration through June 13) Rifampin 04/21/2020-c (expected duration through June 13) Augmentin 03/16/2021-c (expected duration through June 13) IV amikacin 02/06/2021- 3/19, 3/28 -c IV Tigecycline 02/06/21-3/19, 3/22 - 4/12, 4/15-c IV Imipenem 02/06/21-02/14/21 IV Cefoxitin 4/18-c

## 2021-03-19 NOTE — TOC Progression Note (Signed)
Transition of Care [facility]) - Progression Note    Patient Details  Name: Victoria Mendoza MRN: [id number] Date of Birth: Aug 29, 1956  Transition of Care [facility]) CM/SW Contact  Beckie Busing, RN Phone Number: [phone]  03/19/2021, 4:01 PM  Clinical Narrative:    Orders  have been entered for donut cushion and portable pulse ox. CM has called DME company but unable to order because insurance does not cover.        Expected Discharge Plan and Services                                                 Social Determinants of Health (SDOH) Interventions    Readmission Risk Interventions No flowsheet data found.

## 2021-03-20 ENCOUNTER — Other Ambulatory Visit (HOSPITAL_COMMUNITY): Payer: Self-pay

## 2021-03-20 MED ORDER — ALBUTEROL SULFATE HFA 108 (90 BASE) MCG/ACT IN AERS
2.0000 | INHALATION_SPRAY | Freq: Four times a day (QID) | RESPIRATORY_TRACT | 0 refills | Status: DC | PRN
  Filled 2021-03-20: qty 8.5, 30d supply, fill #0

## 2021-03-20 MED ORDER — AMOXICILLIN-POT CLAVULANATE 875-125 MG PO TABS
1.0000 | ORAL_TABLET | Freq: Two times a day (BID) | ORAL | 0 refills | Status: AC
  Filled 2021-03-20: qty 12, 6d supply, fill #0

## 2021-03-20 MED ORDER — ETHAMBUTOL HCL 100 MG PO TABS
1000.0000 mg | ORAL_TABLET | Freq: Every day | ORAL | 0 refills | Status: DC
  Filled 2021-03-20: qty 150, 15d supply, fill #0

## 2021-03-20 MED ORDER — POLYETHYLENE GLYCOL 3350 17 GM/SCOOP PO POWD
17.0000 g | Freq: Every day | ORAL | 0 refills | Status: AC | PRN
  Filled 2021-03-20: qty 510, 30d supply, fill #0

## 2021-03-20 MED ORDER — HEPARIN SOD (PORK) LOCK FLUSH 100 UNIT/ML IV SOLN
250.0000 [IU] | INTRAVENOUS | Status: AC | PRN
  Administered 2021-03-20: 250 [IU]
  Filled 2021-03-20: qty 2.5

## 2021-03-20 MED ORDER — RIFAMPIN 300 MG PO CAPS
600.0000 mg | ORAL_CAPSULE | Freq: Every morning | ORAL | 0 refills | Status: DC
  Filled 2021-03-20: qty 96, 48d supply, fill #0

## 2021-03-20 MED ORDER — HYDROXYZINE HCL 25 MG PO TABS
25.0000 mg | ORAL_TABLET | ORAL | 0 refills | Status: AC | PRN
  Filled 2021-03-20: qty 30, 15d supply, fill #0

## 2021-03-20 MED ORDER — ALBUTEROL SULFATE (2.5 MG/3ML) 0.083% IN NEBU
2.5000 mg | INHALATION_SOLUTION | RESPIRATORY_TRACT | 0 refills | Status: DC | PRN
  Filled 2021-03-20: qty 90, 5d supply, fill #0

## 2021-03-20 MED ORDER — ETHAMBUTOL HCL 400 MG PO TABS: 1000.0000 mg | ORAL_TABLET | Freq: Every day | ORAL | 0 refills | Status: DC

## 2021-03-20 MED ORDER — ALBUTEROL SULFATE HFA 108 (90 BASE) MCG/ACT IN AERS: 2.0000 | INHALATION_SPRAY | Freq: Four times a day (QID) | RESPIRATORY_TRACT | 0 refills | Status: DC | PRN

## 2021-03-20 NOTE — TOC Progression Note (Addendum)
Transition of Care [facility] - Progression Note    Patient Details  Name: Victoria Mendoza MRN: [id number] Date of Birth: [birth year]  Transition of Care [facility]) CM/SW Contact  Beckie Busing, RN Phone Number: [phone]  03/20/2021, 10:56 AM  Clinical Narrative:    Patient is medically ready for discharge. CM spoke with Vernona Rieger at [facility] and Vernona Rieger confirms that agency will not be able to start IV Cefoxitin until 6pm this evening. MD Atha Starks made aware and is ok with med being administered. Home Health infusion has been made aware. Bedside nurse has been made aware. Patient is ok to discharge home with start of IV home infusion this evening. No other needs noted at this time. TOC will sign off     Barriers to Discharge: No Barriers Identified  Expected Discharge Plan and Services                           DME Arranged: N/A DME Agency: NA       HH Arranged: IV Antibiotics,RN (RN through [employer]) HH Agency: Other - See comment,[facility] (now [facility]) ([facility])         Social Determinants of Health (SDOH) Interventions    Readmission Risk Interventions No flowsheet data found.

## 2021-03-20 NOTE — Discharge Instructions (Signed)
Dear Victoria Mendoza,  Thank you for letting us participate in your care.  POST-HOSPITAL & CARE INSTRUCTIONS 1. Your new short term antibiotic AUGMENTIN has six days left. This is taken by mouth. Take as instructed. We have sent the correct amount home with you for six days. You should finish the bottle.  2. Your IV antibiotics are being set up through the outpatient antibiotic infusion clinic. They will coordinate with your home health. You will take 3 (three) antibiotics through your IV: Amikacin, Tigecycline, Cefoxitin.  3. We brought your other 3 (three) oral antibiotics to your bed before you left the hospital. They are Azithryomycin, Ethambutol, Rifampin.  4. Follow up with your infectious disease (ID) and lung doctors (pulmonologist).  5. Establish with a primary care doctor to monitor things like your blood pressure.  6. You will need to buy the donut pillow and finger pulse oximeter over the counter at a pharmacy if you would like them. Your insurance would not cover them through our pharmacy.    DOCTOR'S APPOINTMENT   Future Appointments  Date Time Provider Department Center  03/27/2021  2:00 PM Parrett, Virgel Bouquet, NP [facility] None  03/28/2021  2:30 PM Cliffton Asters, MD [facility]    Follow-up Information    Judyann Munson, MD. Go on 03/28/2021.   Specialty: Infectious Diseases Why: @2 :30pm with Dr. . This is your hopsital follow up appointment with the infectious disease clinic.  Contact information: [address] [place] [phone]        [phone], DO. Go on 03/27/2021.   Specialty: Pulmonary Disease Why: @2pm  with NP Tammy Parret. This will be your hospital follow up with the lung doctor (pulmonologist). If this day and time doesn't work for you, please call the office directly and reschedule.  Contact information: [address] [place] [address] [place] [phone]               Take care and be  well!  Family Medicine Teaching Service Inpatient Team [place]  [facility]  [address] [place], [address] [place] [phone]

## 2021-03-21 NOTE — Progress Notes (Signed)
[facility] Discharge Summary  Patient name: Victoria Mendoza Medical record number: [id number] Date of birth: May 22, 1956 Age: 65 y.o. Gender: female Date of Admission: 03/11/2021  Date of Discharge: 03/20/2021 Admitting Physician: Patriciaann Clan, DO  Primary Care Provider: Patient, No Pcp Per (Inactive) Consultants: ID, pulm  Indication for Hospitalization: SOB  Discharge Diagnoses/Problem List:  Hypertension Bilateral peripheral neuropathy Hx tuberculosis MAC and M. Abscessus: Chronic  Disposition: Home  Discharge Condition: Stable  Discharge Exam:  Temp:  [98.2 F (36.8 C)-98.6 F (37 C)] 98.6 F (37 C) (04/24 0753) Pulse Rate:  [81-100] 95 (04/24 0753) Resp:  [14-18] 14 (04/24 0753) BP: (109-131)/(61-76) 121/70 (04/24 0753) SpO2:  [95 %-98 %] 98 % (04/24 0753) Physical Exam: General: awake, alert, oriented, NAD Cardiovascular: RRR no murmur Respiratory: slight wheezes heard in posterior right lower lobe Extremities: moving all extremities  Brief Hospital Course:  Victoria Mendoza is a 65 y.o. female presenting with worsening shortness of breath. 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  Pt presents with progressive dyspnea on exertion for the past several weeks, with subsequent worsening over the past several days, associated with yellow sputum (not increased in production or purulence).  Afebrile and breathing comfortably at rest, diffuse expiratory rhonchi/occasional wheeze on exam.  No hypoxia or leukocytosis.  Unclear etiology and may be multifactorial.  Currently follows with ID with regimen including p.o. azithromycin, ethambutol, rifampin, and IV amikacin/IV tigecycline, however patient recently self-discontinued tigecycline d/t concern it was causing her SOB.  CT showed stable cavitary lesions.  No PE or PNA on CTA.  COVID/flu negative.  Troponin  negative at 5>8 without ischemic EKG changes (some non-specific T wave changes), doubt underlying MI/angina. As she otherwise is hemodynamically stable. ID consulted. Antibiotic summary below. Pulmonology consulted to evaluate patient for pulmonary rehab, obtained sputum culture. Pulm started patient on Augmentin with improvement in SOB symptoms. Ambulation with pulse ox demonstrated normal SpO2 during all activity. Patient discharged on four oral medications: augmentin, azithromycin, ethambutol, and rifampin. Also discharged on three IV medications: amikacin, tigecycline, cefoxitin.   Antibiotic history Azithromycin 04/01/2020-c (expected duration through June 13) Ethambutol 04/21/2020-c (expected duration through June 13) Rifampin 04/21/2020-c (expected duration through June 13) Augmentin 03/16/2021-c (expected duration through June 13) IV amikacin 02/06/2021- 3/19, 3/28 -c IV Tigecycline 02/06/21-3/19, 3/22 - 4/12, 4/15-c IV Imipenem 02/06/21-02/14/21 IV Cefoxitin 4/18-c  Issues for Follow Up:  1. We will recommend discontinuation of bisoprolol-HCTZ on discharge, she has been doing well without it.  2. Follow up adherence to PO and IV antibiotics 3. Outpatient follow up with pulmonology, pulmonary rehab 4. Outpatient follow up with ID 5. Needs to establish with PCP.  6. Monitor foot numbness with PCP. ID does not believe this should be a side effect from her antibiotics. Unclear etiology.   Significant Procedures: None  Significant Labs and Imaging:  No results for input(s): WBC, HGB, HCT, PLT in the last 168 hours. Recent Labs  Lab 03/15/21 0525 03/17/21 0530 03/18/21 0154 03/19/21 0258  NA 133* 134*  --  132*  K 3.9 4.2  --  4.0  CL 100 100  --  98  CO2 29 29  --  28  GLUCOSE 85 90  --  91  BUN 12 10  --  14  CREATININE 0.74 0.85 0.80 0.77  CALCIUM 8.3* 8.7*  --  8.6*    CHEST - 2 VIEW 03/11/2021 COMPARISON:  03/14/2020 FINDINGS:  Cardiac shadow is within normal limits. Aortic  calcifications are again noted. Right-sided PICC line is noted with catheter tip in the mid superior vena cava. This is within normal limits. The lungs are well aerated bilaterally with biapical scarring and bullous changes. These changes are stable in appearance from the prior CT examination. Breast implants are noted. IMPRESSION: Chronic changes in the upper lobes bilaterally consistent with prior infection and scarring. No new focal abnormality is seen PICC line in satisfactory position.  CT ANGIOGRAPHY CHEST WITH CONTRAST 03/11/2021 TECHNIQUE: Multidetector CT imaging of the chest was performed using the standard protocol during bolus administration of intravenous contrast. Multiplanar CT image reconstructions and MIPs were obtained to evaluate the vascular anatomy. CONTRAST:  43m OMNIPAQUE IOHEXOL 350 MG/ML SOLN COMPARISON:  04/15/2020 FINDINGS: Cardiovascular: No filling defects in the pulmonary arteries to suggest pulmonary emboli. Heart is normal size. Aorta is normal caliber. Coronary artery and aortic calcifications.  Mediastinum/Nodes: No mediastinal, hilar, or axillary adenopathy. Trachea and esophagus are unremarkable. Thyroid unremarkable.  Lungs/Pleura: Severe bullous emphysema, bronchiectasis and scarring throughout the lungs, most pronounced in the upper lung zones. Extensive nodular densities in the left lower lobe. Findings are stable since prior study. No effusions or acute infiltrates.  Upper Abdomen: Imaging into the upper abdomen demonstrates no acute findings.  Musculoskeletal: Bilateral breast implants noted. Otherwise chest wall soft tissues are unremarkable. No acute bony abnormality.  Review of the MIP images confirms the above findings.  IMPRESSION: Bullous disease or cavitary lesions in the upper lobes bilaterally, stable since prior study. Extensive bronchiectasis, scarring and nodular disease throughout both lungs. 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 No pulmonary embolus. Coronary artery disease. No acute cardiopulmonary disease.  Results/Tests Pending at Time of Discharge: None  Discharge Medications:  Allergies as of 03/20/2021   No Known Allergies     Medication List    STOP taking these medications   Acetylcysteine 600 MG Caps   bisoprolol-hydrochlorothiazide 2.5-6.25 MG tablet Commonly known as: ZIAC   sodium chloride HYPERTONIC 3 % nebulizer solution   tigecycline 50 MG injection Commonly known as: TYGACIL Replaced by: tigecycline  IVPB     TAKE these medications   albuterol (2.5 MG/3ML) 0.083% nebulizer solution Commonly known as: PROVENTIL Take 3 mLs (2.5 mg total) by nebulization every four hours as needed for wheezing or shortness of breath.   albuterol 108 (90 Base) MCG/ACT inhaler Commonly known as: VENTOLIN HFA Inhale 2 puffs into the lungs every 6 (six) hours as needed for wheezing or shortness of breath.   amikacin  IVPB Commonly known as: AMIKIN Inject 1,000 mg into the vein every Monday, Wednesday, and Friday. Indication:  M abscessus pulmonary infection  First Dose: Yes Last Day of Therapy:  05/08/21- Subject to change  Labs - Sunday/Monday:  CBC/D, BMP, and amikacin trough. Labs - Thursday:  BMP and amikacin trough Labs - Every other week:  ESR and CRP Method of administration: Elastomeric Method of administration may be changed at the discretion of home infusion pharmacist based upon assessment of the patient and/or caregiver's ability to self-administer the medication ordered. What changed:   when to take this  additional instructions   amoxicillin-clavulanate 875-125 MG tablet Commonly known as: AUGMENTIN Take 1 tablet by mouth every 12 (twelve) hours for 6 days.   azithromycin 250 MG tablet Commonly known as: ZITHROMAX Take 1 tablet (250 mg total) by mouth daily. What changed: Another medication with the same  name was removed.  Continue taking this medication, and follow the directions you see here.   cefOXitin 12 g in sodium chloride 0.9 % 190 mL Inject 12 g into the vein daily. As a continuous infusion. Indication:  M abscessus pulmonary infection  First Dose: Yes Last Day of Therapy:  05/08/21- Subject to change  Labs - _0 /21/22 1524          Discharge Instructions: Please refer to Patient Instructions section of EMR for full details.  Patient was counseled important signs and symptoms that should prompt return to medical care, changes in medications, dietary instructions, activity restrictions, and follow up appointments.   Follow-Up Appointments:  Follow-up Information    Carlyle Basques, MD. Go on 03/28/2021.   Specialty: Infectious Diseases Why: _1 :30pm with  Dr. Megan Salon. This is your hopsital follow up appointment with the infectious disease clinic.  Contact information: [address] [place] [address] [phone]        Julian Hy, DO. Go on 03/27/2021.   Specialty: Pulmonary Disease Why: _0  with NP Tammy Parret. This will be your hospital follow up with the lung doctor (pulmonologist). If this day and time doesn't work for you, please call the office directly and reschedule.  Contact information: [address] [place] [address] [phone]               Ezequiel Essex, MD 03/21/2021, 2:46 PM PGY-1, [facility]

## 2021-03-23 NOTE — Discharge Summary (Signed)
[facility] Discharge Summary  Patient name: Victoria Mendoza    Medical record number: [id number] Date of birth: Apr 17, 1956          Age: 66 y.o.    Gender: female Date of Admission: 03/11/2021                      Date of Discharge: 03/20/2021 Admitting Physician: Patriciaann Clan, DO  Primary Care Provider: Patient, No Pcp Per (Inactive) Consultants: ID, pulm  Indication for Hospitalization: SOB  Discharge Diagnoses/Problem List:  Hypertension Bilateral peripheral neuropathy Hx tuberculosis MAC and M. Abscessus: Chronic  Disposition: Home  Discharge Condition: Stable  Discharge Exam:  Temp: [98.2 F (36.8 C)-98.6 F (37 C)] 98.6 F (37 C) (04/24 0753) Pulse Rate: [81-100] 95 (04/24 0753) Resp: [14-18] 14 (04/24 0753) BP: (109-131)/(61-76) 121/70 (04/24 0753) SpO2: [95 %-98 %] 98 % (04/24 0753) Physical Exam: General:awake, alert, oriented, NAD Cardiovascular:RRR no murmur Respiratory:slight wheezes heard in posterior right lower lobe Extremities:moving all extremities  Brief Hospital Course:  Victoria Mendoza a 65 y.o.femalepresenting with worsening shortness of breath. PMH is significant forMycobacterium(MAC and abscessus)infection followed by ID,hypertension, bronchiectasis,and previous tuberculosis.  Ptpresents with progressive dyspnea on exertion for the past several weeks, with subsequent worsening over the past several days, associated with yellow sputum(not increased in production or purulence). Afebrile and breathing comfortably at rest, diffuse expiratory rhonchi/occasional wheeze on exam. No hypoxia or leukocytosis. Unclear etiology and may be multifactorial. Currently follows with ID with regimen including p.o. azithromycin, ethambutol, rifampin, and IV amikacin/IV tigecycline, however patient recently self-discontinued tigecycline d/t concern it was causing her SOB.CT showed stable cavitary lesions. No PE or  PNAon CTA. COVID/flu negative. Troponin negative at 5>8 without ischemic EKG changes (some non-specific T wave changes), doubt underlying MI/angina. As she otherwise is hemodynamically stable. ID consulted. Antibiotic summary below. Pulmonology consulted to evaluate patient for pulmonary rehab, obtained sputum culture. Pulm started patient on Augmentin with improvement in SOB symptoms. Ambulation with pulse ox demonstrated normal SpO2 during all activity. Patient discharged on four oral medications: augmentin, azithromycin, ethambutol, and rifampin. Also discharged on three IV medications: amikacin, tigecycline, cefoxitin.   Antibiotic history Azithromycin5/05/2020-c (expected duration through June 13) Ethambutol 04/21/2020-c (expected duration through June 13) Rifampin 04/21/2020-c(expected duration through June 13) Augmentin 03/16/2021-c (expected duration through June 13) IV amikacin 02/06/2021- 3/19, 3/28 -c IV Tigecycline 02/06/21-3/19, 3/22 - 4/12, 4/15-c IV Imipenem 02/06/21-02/14/21 IV Cefoxitin 4/18-c  Issues for Follow Up:  1. We will recommend discontinuation of bisoprolol-HCTZ on discharge, she has been doing well without it.  2. Follow up adherence to PO and IV antibiotics 3. Outpatient follow up with pulmonology, pulmonary rehab 4. Outpatient follow up with ID 5. Needs to establish with PCP.  6. Monitor foot numbness with PCP. ID does not believe this should be a side effect from her antibiotics. Unclear etiology.   Significant Procedures: None  Significant Labs and Imaging:  Last Labs   No results for input(s): WBC, HGB, HCT, PLT in the last 168 hours.   Last Labs         Recent Labs  Lab 03/15/21 0525 03/17/21 0530 03/18/21 0154 03/19/21 0258  NA 133* 134*  --  132*  K 3.9 4.2  --  4.0  CL 100 100  --  98  CO2 29 29  --  28  GLUCOSE 85 90  --  91  BUN 12 10  --  14  CREATININE 0.74 0.85 0.80  0.77  CALCIUM 8.3* 8.7*  --  8.6*      CHEST - 2 VIEW  03/11/2021 COMPARISON: 03/14/2020 FINDINGS: Cardiac shadow is within normal limits. Aortic calcifications are again noted. Right-sided PICC line is noted with catheter tip in the mid superior vena cava. This is within normal limits. The lungs are well aerated bilaterally with biapical scarring and bullous changes. These changes are stable in appearance from the prior CT examination. Breast implants are noted. IMPRESSION: Chronic changes in the upper lobes bilaterally consistent with prior infection and scarring. No new focal abnormality is seen PICC line in satisfactory position.  CT ANGIOGRAPHY CHEST WITH CONTRAST 03/11/2021 TECHNIQUE: Multidetector CT imaging of the chest was performed using the standard protocol during bolus administration of intravenous contrast. Multiplanar CT image reconstructions and MIPs were obtained to evaluate the vascular anatomy. CONTRAST: 61m OMNIPAQUE IOHEXOL 350 MG/ML SOLN COMPARISON: 04/15/2020 FINDINGS: Cardiovascular: No filling defects in the pulmonary arteries to suggest pulmonary emboli. Heart is normal size. Aorta is normal caliber. Coronary artery and aortic calcifications.  Mediastinum/Nodes: No mediastinal, hilar, or axillary adenopathy. Trachea and esophagus are unremarkable. Thyroid unremarkable.  Lungs/Pleura: Severe bullous emphysema, bronchiectasis and scarring throughout the lungs, most pronounced in the upper lung zones. Extensive nodular densities in the left lower lobe. Findings are stable since prior study. No effusions or acute infiltrates.  Upper Abdomen: Imaging into the upper abdomen demonstrates no acute findings.  Musculoskeletal: Bilateral breast implants noted. Otherwise chest wall soft tissues are unremarkable. No acute bony abnormality.  Review of the MIP images confirms the above findings.  IMPRESSION: Bullous disease or cavitary lesions in the upper lobes bilaterally, stable since prior study.  Extensive bronchiectasis, scarring and nodular disease throughout both lungs. Findings most compatible with chronic atypical infection such as mycobacterium avium. No pulmonary embolus. Coronary artery disease. No acute cardiopulmonary disease.  Results/Tests Pending at Time of Discharge: None  Discharge Medications:  Allergies as of 03/20/2021   No Known Allergies              Medication List        STOP taking these medications       Acetylcysteine 600 MG Caps   bisoprolol-hydrochlorothiazide 2.5-6.25 MG tablet Commonly known as: ZIAC   sodium chloride HYPERTONIC 3 % nebulizer solution   tigecycline 50 MG injection Commonly known as: TYGACIL Replaced by: tigecycline  IVPB             TAKE these medications       albuterol (2.5 MG/3ML) 0.083% nebulizer solution Commonly known as: PROVENTIL Take 3 mLs (2.5 mg total) by nebulization every four hours as needed for wheezing or shortness of breath.   albuterol 108 (90 Base) MCG/ACT inhaler Commonly known as: VENTOLIN HFA Inhale 2 puffs into the lungs every 6 (six) hours as needed for wheezing or shortness of breath.   amikacin  IVPB Commonly known as: AMIKIN Inject 1,000 mg into the vein every Monday, Wednesday, and Friday. Indication:  M abscessus pulmonary infection  First Dose: Yes Last Day of Therapy:  05/08/21- Subject to change  Labs - Sunday/Monday:  CBC/D, BMP, and amikacin trough. Labs - Thursday:  BMP and amikacin trough Labs - Every other week:  ESR and CRP Method of administration: Elastomeric Method of administration may be changed at the discretion of home infusion pharmacist based upon assessment of the patient and/or caregiver's ability to self-administer the medication ordered. What changed:   when to take this  additional instructions   amoxicillin-clavulanate  875-125 MG tablet Commonly known as: AUGMENTIN Take 1 tablet by mouth every 12 (twelve) hours for 6 days.    azithromycin 250 MG tablet Commonly known as: ZITHROMAX Take 1 tablet (250 mg total) by mouth daily. What changed: Another medication with the same name was removed. Continue taking this medication, and follow the directions you see here.   cefOXitin 12 g in sodium chloride 0.9 % 190 mL Inject 12 g into the vein daily. As a continuous infusion. Indication:  M abscessus pulmonary infection  First Dose: Yes Last Day of Therapy:  05/08/21- Subject to change  Labs - _0 /24/22 1000                     Discharge Care Instructions  (From admission, onward)                        Start     Ordered  03/16/21 0000  Change dressing on IV access line weekly and PRN  (Home infusion instructions - [facility] )        03/16/21 1524            Discharge Instructions: Please refer to Patient Instructions section of EMR for full details.  Patient was counseled important signs and symptoms that should prompt return to medical care, changes in medications, dietary instructions, activity restrictions, and follow up appointments.   Follow-Up Appointments:      Follow-up Information        Carlyle Basques, MD. Go on 03/28/2021.   Specialty: Infectious Diseases Why: _0 :30pm with Dr. Megan Salon. This is your hopsital follow up appointment with the infectious disease clinic.  Contact information: [address] [place] [address] [phone]             Julian Hy, DO. Go on 03/27/2021.   Specialty: Pulmonary Disease Why: _1  with NP Tammy Parret. This will be your hospital follow up with the lung doctor  (pulmonologist). If this day and time doesn't work for you, please call the office directly and reschedule.  Contact information: [address] [place] [address] [phone]                Ezequiel Essex, MD 03/21/2021, 2:46 PM PGY-1, [place]

## 2021-03-27 ENCOUNTER — Inpatient Hospital Stay: Payer: 59 | Admitting: Adult Health

## 2021-03-27 ENCOUNTER — Encounter: Payer: Self-pay | Admitting: Internal Medicine

## 2021-03-28 ENCOUNTER — Ambulatory Visit (INDEPENDENT_AMBULATORY_CARE_PROVIDER_SITE_OTHER): Payer: 59 | Admitting: Internal Medicine

## 2021-03-28 ENCOUNTER — Other Ambulatory Visit: Payer: Self-pay

## 2021-03-28 ENCOUNTER — Encounter: Payer: Self-pay | Admitting: Internal Medicine

## 2021-03-28 DIAGNOSIS — A319 Mycobacterial infection, unspecified: Secondary | ICD-10-CM

## 2021-03-28 DIAGNOSIS — A318 Other mycobacterial infections: Secondary | ICD-10-CM

## 2021-03-28 DIAGNOSIS — A310 Pulmonary mycobacterial infection: Secondary | ICD-10-CM | POA: Diagnosis not present

## 2021-03-28 DIAGNOSIS — R634 Abnormal weight loss: Secondary | ICD-10-CM | POA: Diagnosis not present

## 2021-03-28 MED ORDER — ALBUTEROL SULFATE HFA 108 (90 BASE) MCG/ACT IN AERS: 2.0000 | INHALATION_SPRAY | Freq: Four times a day (QID) | RESPIRATORY_TRACT | 0 refills | Status: DC | PRN

## 2021-03-28 NOTE — Assessment & Plan Note (Signed)
I encouraged her to add snacks and/or supplements between her regular meals.

## 2021-03-28 NOTE — Assessment & Plan Note (Signed)
She will continue oral azithromycin, ethambutol and rifampin.  I will see if she can give [place] a sputum sample for repeat stain and culture.

## 2021-03-28 NOTE — Progress Notes (Signed)
Reach out to [facility] at [phone] and connected with Clinical Manger who will fax results to triage.

## 2021-03-28 NOTE — Assessment & Plan Note (Addendum)
Even though it is very difficult for her she is willing to continue IV tigecycline, amikacin and cefoxitin for now.  I will get a repeat sputum sample for AFB smear and culture.  She is due to follow-up with her thoracic surgeon at [facility] on 04/20/2021.  I have not received the results of her recent lab work.  We contacted her home health agency to request the results ASAP.

## 2021-03-28 NOTE — Progress Notes (Signed)
[facility] for Infectious Disease  Patient Active Problem List   Diagnosis Date Noted  . 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 Unintentional weight loss 03/28/2021  . SOB (shortness of breath) 03/11/2021  . Bronchiectasis without complication ([place]) 16/38/4665  . History of tuberculosis 04/21/2020  . Hypertension 04/21/2020  . Atherosclerotic peripheral vascular disease ([place]) 04/21/2020    Patient's Medications  New Prescriptions   No medications on file  Previous Medications   ALBUTEROL (PROVENTIL) (2.5 MG/3ML) 0.083% NEBULIZER SOLUTION    Take 3 mLs (2.5 mg total) by nebulization every four hours as needed for wheezing or shortness of breath.   AMIKACIN (AMIKIN) IVPB    Inject 1,000 mg into the vein every Monday, Wednesday, and Friday. Indication:  M abscessus pulmonary infection  First Dose: Yes Last Day of Therapy:  05/08/21- Subject to change  Labs - Sunday/Monday:  CBC/D, BMP, and amikacin trough. Labs - Thursday:  BMP and amikacin trough Labs - Every other week:  ESR and CRP Method of administration: Elastomeric Method of administration may be changed at the discretion of home infusion pharmacist based upon assessment of the patient and/or caregiver's ability to self-administer the medication ordered.   AZITHROMYCIN (ZITHROMAX) 250 MG TABLET    Take 1 tablet (250 mg total) by mouth daily.   B COMPLEX VITAMINS (VITAMIN B COMPLEX) TABS    Take 1 tablet by mouth daily.   CEFOXITIN 12 G IN SODIUM CHLORIDE 0.9 % 190 ML    Inject 12 g into the vein daily. As a continuous infusion. Indication:  M abscessus pulmonary infection  First Dose: Yes Last Day of Therapy:  05/08/21- Subject to change  Labs - Sunday/Monday:  CBC/D, BMP Labs - Thursday:  BMP  Labs - Every other week:  ESR and CRP Method of administration: Elastomeric Method of administration may be changed at the  discretion of home infusion pharmacist based upon assessment of the patient and/or caregiver's ability to self-administer the medication ordered.   ETHAMBUTOL (MYAMBUTOL) 400 MG TABLET    Take 2.5 tablets (1,000 mg total) by mouth daily.   HYDROXYZINE (ATARAX/VISTARIL) 25 MG TABLET    Take 1 tablet (25 mg total) by mouth as needed (one time dose for increased anxiety).   MISC. DEVICES (PULSE OXIMETER) MISC    1 Units by Does not apply route as needed.   MULTIPLE VITAMIN (MULTIVITAMIN WITH MINERALS) TABS TABLET    Take 1 tablet by mouth daily.   OMEGA-3 FATTY ACIDS (OMEGA 3 PO)    Take 4 capsules by mouth daily.   POLYETHYLENE GLYCOL POWDER (GLYCOLAX/MIRALAX) 17 GM/SCOOP POWDER    dissolve  1 capful in water and take by daily as needed for mild constipation.   POLYVINYL ALCOHOL (LIQUIFILM TEARS) 1.4 % OPHTHALMIC SOLUTION    Place 1 drop into both eyes daily as needed for dry eyes.   PYRIDOXINE HCL (VITAMIN B-6 PO)    Take 1 tablet by mouth daily.   RIFAMPIN (RIFADIN) 300 MG CAPSULE    Take 2 capsules (600 mg total) by mouth every morning.   TIGECYCLINE (TYGACIL) IVPB    Inject 50 mg into the vein daily. Indication:  M abscessus pulmonary infection  First Dose: Yes Last Day of Therapy:  05/08/21 Labs - Once weekly:  CBC/D and CMP Labs - Every other week:  ESR and CRP Method of administration:  Mini-Bag Plus / Gravity Method of administration may be changed at the discretion of home infusion pharmacist based upon assessment of the patient and/or caregiver's ability to self-administer the medication ordered.   TRIAMCINOLONE (KENALOG) 0.025 % OINTMENT    Apply 1 application topically 2 (two) times daily.  Modified Medications   Modified Medication Previous Medication   ALBUTEROL (VENTOLIN HFA) 108 (90 BASE) MCG/ACT INHALER albuterol (VENTOLIN HFA) 108 (90 Base) MCG/ACT inhaler      Inhale 2 puffs into the lungs every 6 (six) hours as needed for wheezing or shortness of breath.    Inhale 2 puffs into  the lungs every 6 (six) hours as needed for wheezing or shortness of breath.  Discontinued Medications   No medications on file    Subjective: Loni is in for her hospital follow-up visit.  She was hospitalized on 03/11/2021 with extreme weakness and shortness of breath.  She had called and said that she was unable to tolerate her complicated oral and IV antibiotic regimen.  While hospitalized she was continued on oral azithromycin, ethambutol and rifampin.  IV tigecycline and amikacin were continued and cefoxitin was added in place of omadacycline.  She was discharged on 03/16/2021.  She has not had any problems with her PICC line.  She has not had any nausea, vomiting, diarrhea or fever.  She says that over the last few days her cough has improved significantly.  She says that her shortness of breath may be slightly better since she was discharged but she continues to have significant dyspnea on exertion such that she cannot do any exercise.  She remains extremely weak and notes that she is losing weight even though she is eating well.  She says she is very shaky when she stands up.  Review of Systems: Review of Systems  Constitutional: Positive for weight loss. Negative for chills, diaphoresis, fever and malaise/fatigue.  Respiratory: Positive for cough, sputum production, shortness of breath and wheezing. Negative for hemoptysis.   Cardiovascular: Negative for chest pain.  Gastrointestinal: Negative for abdominal pain, diarrhea, nausea and vomiting.  Skin: Negative for rash.    Past Medical History:  Diagnosis Date  . Bronchiectasis ([place])   . Hypertension   . Pulmonary TB 2018    Social History   Tobacco Use  . Smoking status: Never Smoker  . Smokeless tobacco: Never Used  Vaping Use  . Vaping Use: Never used  Substance Use Topics  . Alcohol use: Never  . Drug use: Never    Family History  Problem Relation Age of Onset  . Cancer Mother   . Heart attack Father     No Known  Allergies  Objective: Vitals:   03/28/21 1418  BP: (!) 125/7  Pulse: (!) 120  Temp: 98.1 F (36.7 C)  Weight: 109 lb (49.4 kg)  Height: _0  (1.549 m)   Body mass index is 20.6 kg/m.  Physical Exam Constitutional:      Comments: Her weight is down 7 pounds.  Cardiovascular:     Rate and Rhythm: Regular rhythm. Tachycardia present.     Heart sounds: No murmur heard.   Pulmonary:     Effort: Pulmonary effort is normal.     Breath sounds: Rales present. No wheezing.     Comments: She has a few crackles in her right lung anteriorly. Skin:    Comments: Right arm PICC site looks good.  Psychiatric:        Mood and Affect: Mood normal.  Lab Results    Problem List Items Addressed This Visit      High   Mycobacterium avium complex ([place])    She will continue oral azithromycin, ethambutol and rifampin.  I will see if she can give [place] a sputum sample for repeat stain and culture.      Mycobacterium abscessus infection    Even though it is very difficult for her she is willing to continue IV tigecycline, amikacin and cefoxitin for now.  I will get a repeat sputum sample for AFB smear and culture.  She is due to follow-up with her thoracic surgeon at [facility] on 04/20/2021.  I have not received the results of her recent lab work.  We contacted her home health agency to request the results ASAP.      Relevant Orders    MYCOBACTERIA, CULTURE, WITH FLUOROCHROME SMEAR     Unprioritized   Unintentional weight loss    I encouraged her to add snacks and/or supplements between her regular meals.          Michel Bickers, MD [facility] [phone] pager   [phone] cell 03/28/2021, 3:14 PM

## 2021-04-17 ENCOUNTER — Telehealth: Payer: Self-pay

## 2021-04-17 NOTE — Telephone Encounter (Signed)
Received call from Mia at [facility] wanting to know if our office had results of patient's BMP from 04/13/21.   RN called [facility] home [facility], spoke with nurse Marchelle Folks. She states only an amikacin was drawn on 5/19 and that a BMP would have been drawn 5/16 and advised that next labs are due to be drawn today.   Requested that [facility] fax copy of 5/16 labs to [place] as well as [facility]. Called Mia at [facility] back and advised her that 5/16 labs should be sent to her and that additional labs should be drawn today. No further questions.   Sandie Ano, RN

## 2021-04-25 ENCOUNTER — Telehealth: Payer: 59 | Admitting: Internal Medicine

## 2021-04-26 ENCOUNTER — Other Ambulatory Visit: Payer: Self-pay

## 2021-04-26 ENCOUNTER — Other Ambulatory Visit: Payer: Self-pay | Admitting: *Deleted

## 2021-04-26 ENCOUNTER — Telehealth (INDEPENDENT_AMBULATORY_CARE_PROVIDER_SITE_OTHER): Payer: 59 | Admitting: Internal Medicine

## 2021-04-26 ENCOUNTER — Telehealth: Payer: Self-pay

## 2021-04-26 DIAGNOSIS — A310 Pulmonary mycobacterial infection: Secondary | ICD-10-CM | POA: Diagnosis not present

## 2021-04-26 DIAGNOSIS — A318 Other mycobacterial infections: Secondary | ICD-10-CM

## 2021-04-26 DIAGNOSIS — J432 Centrilobular emphysema: Secondary | ICD-10-CM

## 2021-04-26 DIAGNOSIS — A319 Mycobacterial infection, unspecified: Secondary | ICD-10-CM

## 2021-04-26 MED ORDER — RIFAMPIN 300 MG PO CAPS: 600.0000 mg | ORAL_CAPSULE | Freq: Every morning | ORAL | 5 refills | Status: AC

## 2021-04-26 MED ORDER — ETHAMBUTOL HCL 400 MG PO TABS: 1000.0000 mg | ORAL_TABLET | Freq: Every day | ORAL | 5 refills | Status: AC

## 2021-04-26 MED ORDER — AZITHROMYCIN 250 MG PO TABS: 250.0000 mg | ORAL_TABLET | Freq: Every day | ORAL | 5 refills | Status: AC

## 2021-04-26 NOTE — Telephone Encounter (Signed)
Pharmacist from [facility] returned call stating TPA would be roughly $50 to be given at home. Patient reports that she cannot afford it. Left voicemail with patient requesting to call back to discuss options (ED for TPA vs continuing to let home health do peripheral lab draws).   Cory Rama Loyola Mast, RN

## 2021-04-26 NOTE — Telephone Encounter (Signed)
Received call from patient's home health agency [facility]) stating that the PICC line is not giving blood return. Patient has been getting lab draws 2x per week. Patient has started refusing second lab draw on Thursday (BMP and 2nd Amikacin trough). Spoke with Dr. Orvan Falconer who gave verbal orders for TPA first and if that doesn't work, PICC exchange.   RN gave verbal orders to [facility] for TPA who will run order through insurance to determine copay and if it can be done in home. Pharmacist will call office back if unable to do.   Lakendrick Paradis Loyola Mast, RN   Rica Mast [facility] RN [phone] [facility]: [phone]

## 2021-04-26 NOTE — Telephone Encounter (Signed)
Much thanks.

## 2021-04-26 NOTE — Telephone Encounter (Signed)
Please let her know that her blood work looks good and that there is no evidence that she is having side effects of her antibiotics.  We will continue to monitor this closely.

## 2021-04-26 NOTE — Telephone Encounter (Signed)
Called patient to relay Dr. Blair Dolphin message, no answer. Left HIPAA compliant voicemail requesting callback.   Sandie Ano, RN

## 2021-04-26 NOTE — Progress Notes (Signed)
Virtual Visit via Telephone Note  I connected with Victoria Mendoza on 04/26/21 at 10:45 AM EDT by telephone and verified that I am speaking with the correct person using two identifiers.  Location: Patient: Home Provider: [facility]   I discussed the limitations, risks, security and privacy concerns of performing an evaluation and management service by telephone and the availability of in person appointments. I also discussed with the patient that there may be a patient responsible charge related to this service. The patient expressed understanding and agreed to proceed.   History of Present Illness: I called and spoke with Victoria Mendoza today.  Initially she said that she was feeling worse then said she is feeling about the same.  When I questioned her specifically she said that her cough and sputum production have improved.  She remains quite short of breath with any exertion and remains very fatigued.  She has not had any specific problems tolerating her oral and IV antibiotics.  She saw her thoracic surgeon at [facility] last week and he said that he would not do surgery on her anytime soon.  He wants her to start pulmonary rehab.  She is waiting on a call back from the rehab center in [place].   Observations/Objective: Creatinine low normal on 04/18/2021  Assessment and Plan: She is showing some improvement on a very complex antibiotic regimen for her Mycobacterium abscessus and avium pneumonia.  Follow Up Instructions: Continue current antibiotics Monitor blood work twice weekly Start pulmonary rehab Follow-up here in 4 weeks   I discussed the assessment and treatment plan with the patient. The patient was provided an opportunity to ask questions and all were answered. The patient agreed with the plan and demonstrated an understanding of the instructions.   The patient was advised to call back or seek an in-person evaluation if the symptoms worsen or if the condition fails to improve as  anticipated.  I provided 14 minutes of non-face-to-face time during this encounter.   Cliffton Asters, MD

## 2021-04-26 NOTE — Telephone Encounter (Signed)
Patient called, states she forgot to ask Dr. Orvan Falconer a question at her appointment today. She wants to know how long her body can handle the IV antibiotics because they are "so strong." Advised patient that Dr. Blair Dolphin note says to continue antibiotics for now and to follow up in 4 weeks. Patient is still expressing concern over the lengthy duration of therapy and again asks "How long can I take this?"   Sandie Ano, RN

## 2021-04-26 NOTE — Telephone Encounter (Signed)
Patient returning call, RN relayed per Dr. Orvan Falconer that as of right now her lab work looks good and she is not showing any signs of side effects. Assured patient that her response to the antibiotics will be monitored closely by Dr. Orvan Falconer. Patient verbalized understanding and has no further questions.   Sandie Ano, RN

## 2021-04-28 ENCOUNTER — Telehealth: Payer: Self-pay

## 2021-04-28 NOTE — Telephone Encounter (Signed)
Received voicemail from [facility] team stating patient refused labs on 04/27/21. Home health will be back in the home to draw labs on 05/01/21. Routing to provider to make aware.

## 2021-04-28 NOTE — Telephone Encounter (Signed)
Please add her on my schedule next Wednesday. Thanks.

## 2021-05-01 NOTE — Telephone Encounter (Signed)
Patient returned call and accepts video visit only. Patient states Home health is on their way to drawn labs today. Routing to provider as Lorain Childes.  Valarie Cones

## 2021-05-01 NOTE — Telephone Encounter (Signed)
Dr. Orvan Falconer would like for patient to be scheduled this Wednesday. Attempted to call patient this morning  and left a voicemail to return call for scheduling. Please continue to follow. Thank you.

## 2021-05-03 ENCOUNTER — Encounter: Payer: Self-pay | Admitting: Internal Medicine

## 2021-05-03 ENCOUNTER — Other Ambulatory Visit: Payer: Self-pay

## 2021-05-03 ENCOUNTER — Telehealth (INDEPENDENT_AMBULATORY_CARE_PROVIDER_SITE_OTHER): Payer: Medicare HMO | Admitting: Internal Medicine

## 2021-05-03 DIAGNOSIS — A319 Mycobacterial infection, unspecified: Secondary | ICD-10-CM | POA: Diagnosis not present

## 2021-05-03 DIAGNOSIS — A318 Other mycobacterial infections: Secondary | ICD-10-CM

## 2021-05-03 NOTE — Progress Notes (Signed)
Virtual Visit via Telephone Note  I connected with Victoria Mendoza on 05/03/21 at  9:15 AM EDT by telephone and verified that I am speaking with the correct person using two identifiers.  Location: Patient: Home Provider: [facility]   I discussed the limitations, risks, security and privacy concerns of performing an evaluation and management service by telephone and the availability of in person appointments. I also discussed with the patient that there may be a patient responsible charge related to this service. The patient expressed understanding and agreed to proceed.   History of Present Illness: I called and spoke with Victoria Mendoza today.  She continues on azithromycin, ethambutol and rifampin which she started 13 months ago in May 2021.  She continues on IV antibiotics which were started on 02/21/2021.  She is taking tigecycline, amikacin and cefoxitin.  When I asked her how she was doing she says over about the same but worse.  She says that her cough has improved but she is weaker and more short of breath with activity.  She is still walking about 1 mile each day on her treadmill.   Observations/Objective: Creatinine 05/01/2021 0.64 Sputum AFB 03/28/2021 no AFB seen on smear and cultures negative so far  Assessment and Plan: Although she feels weaker and more short of breath I do think she has gotten some benefit from her current 6 antibiotic regimen.  She is willing, grudgingly, to continue her current antibiotics.  She is scheduled to start pulmonary rehab later this week.  She is frustrated because her thoracic surgeon Dr. Ewing Schlein wants her to follow-up with Dr. Valentina Lucks at [facility] before he will consider any surgery.  She says that she is called Dr. Maretta Los office repeatedly but has been told that the first available appointment is in August.  Follow Up Instructions: Continue current antibiotics Monitor blood work closely and await final culture results Start pulmonary  rehab Follow-up in 3 weeks   I discussed the assessment and treatment plan with the patient. The patient was provided an opportunity to ask questions and all were answered. The patient agreed with the plan and demonstrated an understanding of the instructions.   The patient was advised to call back or seek an in-person evaluation if the symptoms worsen or if the condition fails to improve as anticipated.  I provided 17 minutes of non-face-to-face time during this encounter.   Cliffton Asters, MD

## 2021-05-05 ENCOUNTER — Other Ambulatory Visit: Payer: Self-pay

## 2021-05-05 ENCOUNTER — Encounter: Payer: Medicare HMO | Attending: Internal Medicine | Admitting: *Deleted

## 2021-05-05 DIAGNOSIS — A319 Mycobacterial infection, unspecified: Secondary | ICD-10-CM | POA: Insufficient documentation

## 2021-05-05 DIAGNOSIS — A318 Other mycobacterial infections: Secondary | ICD-10-CM

## 2021-05-05 DIAGNOSIS — J432 Centrilobular emphysema: Secondary | ICD-10-CM | POA: Insufficient documentation

## 2021-05-05 NOTE — Progress Notes (Signed)
Virtual orientation call completed today. shehas an appointment on Date: 05/15/2021  for EP eval and gym Orientation.  Documentation of diagnosis can be found in [facility]  Date: 04/26/2021 and 03/28/2021 .

## 2021-05-08 ENCOUNTER — Telehealth: Payer: Self-pay

## 2021-05-08 ENCOUNTER — Encounter: Payer: Self-pay | Admitting: Internal Medicine

## 2021-05-08 NOTE — Telephone Encounter (Signed)
Leslie with [facility] called to see if patient will be continuing IV antibiotics. Per Verlon Au they have the patient completing IV antibiotics today. Verlon Au can be reached at [phone]. Please advise  Evelin Cake T [name]

## 2021-05-09 NOTE — Telephone Encounter (Signed)
As of our last conversation, Victoria Mendoza was agreeable with continuing her IV antibiotics at least through her follow-up appointment with me on 05/25/2021.

## 2021-05-09 NOTE — Telephone Encounter (Signed)
I called Victoria Mendoza back and let her know patient is going to be continuing the IV antibiotics until her follow up with Orvan Falconer on 05/25/21. I have also given verbal orders to Amy with [facility] that patient will be continuing IV antibiotics as well.

## 2021-05-11 ENCOUNTER — Encounter: Payer: Self-pay | Admitting: Internal Medicine

## 2021-05-12 LAB — MYCOBACTERIA,CULT W/FLUOROCHROME SMEAR
MICRO NUMBER:: 11848001
SMEAR:: NONE SEEN
SPECIMEN QUALITY:: ADEQUATE

## 2021-05-15 ENCOUNTER — Encounter: Payer: Self-pay | Admitting: Internal Medicine

## 2021-05-15 ENCOUNTER — Other Ambulatory Visit: Payer: Self-pay

## 2021-05-15 VITALS — Ht 61.0 in | Wt 109.0 lb

## 2021-05-15 DIAGNOSIS — J432 Centrilobular emphysema: Secondary | ICD-10-CM | POA: Diagnosis not present

## 2021-05-15 DIAGNOSIS — A318 Other mycobacterial infections: Secondary | ICD-10-CM

## 2021-05-15 DIAGNOSIS — A319 Mycobacterial infection, unspecified: Secondary | ICD-10-CM | POA: Diagnosis not present

## 2021-05-15 NOTE — Patient Instructions (Signed)
Patient Instructions  Patient Details  Name: Victoria Mendoza MRN: [id number] Date of Birth: [birth year] Referring Provider:  Cliffton Asters, MD  Below are your personal goals for exercise, nutrition, and risk factors. Our goal is to help you stay on track towards obtaining and maintaining these goals. We will be discussing your progress on these goals with you throughout the program.  Initial Exercise Prescription:  Initial Exercise Prescription - 05/15/21 1600       Date of Initial Exercise RX and Referring Provider   Date 05/15/21    Referring Provider Orvan Falconer      Treadmill   MPH 1.5    Grade 0    Minutes 15    METs 2      Recumbant Bike   Level 1    RPM 60    Minutes 15    METs 2      NuStep   Level 1    SPM 80    Minutes 15    METs 2      Biostep-RELP   Level 1    SPM 50    Minutes 15    METs 2      Prescription Details   Frequency (times per week) 3    Duration Progress to 30 minutes of continuous aerobic without signs/symptoms of physical distress      Intensity   THRR 40-80% of Max Heartrate 119-142    Ratings of Perceived Exertion 11-15    Perceived Dyspnea 0-4      Progression   Progression Continue to progress workloads to maintain intensity without signs/symptoms of physical distress.      Resistance Training   Training Prescription Yes    Weight 3 lb    Reps 10-15             Exercise Goals: Frequency: Be able to perform aerobic exercise two to three times per week in program working toward 2-5 days per week of home exercise.  Intensity: Work with a perceived exertion of 11 (fairly light) - 15 (hard) while following your exercise prescription.  We will make changes to your prescription with you as you progress through the program.   Duration: Be able to do 30 to 45 minutes of continuous aerobic exercise in addition to a 5 minute warm-up and a 5 minute cool-down routine.   Nutrition Goals: Your personal nutrition goals will be established  when you do your nutrition analysis with the dietician.  The following are general nutrition guidelines to follow: Cholesterol < 200mg /day Sodium < 1500mg /day Fiber: Women over 50 yrs - 21 grams per day  Personal Goals:  Personal Goals and Risk Factors at Admission - 05/15/21 1644       Core Components/Risk Factors/Patient Goals on Admission    Weight Management Yes;Weight Gain    Intervention Weight Management: Develop a combined nutrition and exercise program designed to reach desired caloric intake, while maintaining appropriate intake of nutrient and fiber, sodium and fats, and appropriate energy expenditure required for the weight goal.;Weight Management: Provide education and appropriate resources to help participant work on and attain dietary goals.    Admit Weight 108 lb (49 kg)    Goal Weight: Short Term 115 lb (52.2 kg)    Goal Weight: Long Term 115 lb (52.2 kg)    Expected Outcomes Short Term: Continue to assess and modify interventions until short term weight is achieved;Long Term: Adherence to nutrition and physical activity/exercise program aimed toward attainment of established weight goal;Weight Gain:  Understanding of general recommendations for a high calorie, high protein meal plan that promotes weight gain by distributing calorie intake throughout the day with the consumption for 4-5 meals, snacks, and/or supplements    Intervention Provide education, individualized exercise plan and daily activity instruction to help decrease symptoms of SOB with activities of daily living.    Expected Outcomes Short Term: Improve cardiorespiratory fitness to achieve a reduction of symptoms when performing ADLs;Long Term: Be able to perform more ADLs without symptoms or delay the onset of symptoms    Increase knowledge of respiratory medications and ability to use respiratory devices properly  Yes    Intervention Provide education and demonstration as needed of appropriate use of medications,  inhalers, and oxygen therapy.    Expected Outcomes Short Term: Achieves understanding of medications use. Understands that oxygen is a medication prescribed by physician. Demonstrates appropriate use of inhaler and oxygen therapy.;Long Term: Maintain appropriate use of medications, inhalers, and oxygen therapy.             Tobacco Use Initial Evaluation: Social History   Tobacco Use  Smoking Status Never  Smokeless Tobacco Never    Exercise Goals and Review:  Exercise Goals     Row Name 05/15/21 1642             Exercise Goals   Increase Physical Activity Yes       Intervention Provide advice, education, support and counseling about physical activity/exercise needs.;Develop an individualized exercise prescription for aerobic and resistive training based on initial evaluation findings, risk stratification, comorbidities and participant's personal goals.       Expected Outcomes Short Term: Attend rehab on a regular basis to increase amount of physical activity.;Long Term: Add in home exercise to make exercise part of routine and to increase amount of physical activity.;Long Term: Exercising regularly at least 3-5 days a week.       Increase Strength and Stamina Yes       Intervention Provide advice, education, support and counseling about physical activity/exercise needs.;Develop an individualized exercise prescription for aerobic and resistive training based on initial evaluation findings, risk stratification, comorbidities and participant's personal goals.       Expected Outcomes Short Term: Increase workloads from initial exercise prescription for resistance, speed, and METs.;Long Term: Improve cardiorespiratory fitness, muscular endurance and strength as measured by increased METs and functional capacity ( )       Able to understand and use rate of perceived exertion (RPE) scale Yes       Intervention Provide education and explanation on how to use RPE scale       Expected  Outcomes Short Term: Able to use RPE daily in rehab to express subjective intensity level;Long Term:  Able to use RPE to guide intensity level when exercising independently       Able to understand and use Dyspnea scale Yes       Intervention Provide education and explanation on how to use Dyspnea scale       Expected Outcomes Short Term: Able to use Dyspnea scale daily in rehab to express subjective sense of shortness of breath during exertion;Long Term: Able to use Dyspnea scale to guide intensity level when exercising independently       Knowledge and understanding of Target Heart Rate Range (THRR) Yes       Intervention Provide education and explanation of THRR including how the numbers were predicted and where they are located for reference       Expected  Outcomes Short Term: Able to state/look up THRR;Short Term: Able to use daily as guideline for intensity in rehab;Long Term: Able to use THRR to govern intensity when exercising independently       Able to check pulse independently Yes       Intervention Provide education and demonstration on how to check pulse in carotid and radial arteries.;Review the importance of being able to check your own pulse for safety during independent exercise       Expected Outcomes Short Term: Able to explain why pulse checking is important during independent exercise;Long Term: Able to check pulse independently and accurately       Understanding of Exercise Prescription Yes       Intervention Provide education, explanation, and written materials on patient's individual exercise prescription       Expected Outcomes Short Term: Able to explain program exercise prescription;Long Term: Able to explain home exercise prescription to exercise independently                Copy of goals given to participant.

## 2021-05-15 NOTE — Progress Notes (Signed)
Pulmonary Individual Treatment Plan  Patient Details  Name: Victoria Mendoza MRN: [id number] Date of Birth: January 11, 1956 Referring Provider:   Flowsheet Row Pulmonary Rehab from 05/15/2021 in [facility] Cardiac and Pulmonary Rehab  Referring Provider Orvan Falconer       Initial Encounter Date:  Flowsheet Row Pulmonary Rehab from 05/15/2021 in [facility] Cardiac and Pulmonary Rehab  Date 05/15/21       Visit Diagnosis: Centrilobular emphysema (HCC)  Mycobacterium abscessus infection  Patient's Home Medications on Admission:  Current Outpatient Medications:    albuterol (PROVENTIL) (2.5 MG/3ML) 0.083% nebulizer solution, Take 3 mLs (2.5 mg total) by nebulization every four hours as needed for wheezing or shortness of breath., Disp: 90 mL, Rfl: 12   albuterol (VENTOLIN HFA) 108 (90 Base) MCG/ACT inhaler, Inhale 2 puffs into the lungs every 6 (six) hours as needed for wheezing or shortness of breath., Disp: 8.5 g, Rfl: 0   azithromycin (ZITHROMAX) 250 MG tablet, Take 1 tablet (250 mg total) by mouth daily., Disp: 30 tablet, Rfl: 5   azithromycin (ZITHROMAX) 500 MG tablet, Take 500 mg by mouth daily., Disp: , Rfl:    B Complex Vitamins (VITAMIN B COMPLEX) TABS, Take 1 tablet by mouth daily., Disp: , Rfl:    ethambutol (MYAMBUTOL) 400 MG tablet, Take 2.5 tablets (1,000 mg total) by mouth daily., Disp: 75 tablet, Rfl: 5   hydrOXYzine (ATARAX/VISTARIL) 25 MG tablet, Take 1 tablet (25 mg total) by mouth as needed (one time dose for increased anxiety)., Disp: 30 tablet, Rfl: 0   Misc. Devices (PULSE OXIMETER) MISC, 1 Units by Does not apply route as needed. (Patient not taking: Reported on 05/05/2021), Disp: 1 each, Rfl: 0   Multiple Vitamin (MULTIVITAMIN WITH MINERALS) TABS tablet, Take 1 tablet by mouth daily., Disp: , Rfl:    Omega-3 Fatty Acids (OMEGA 3 PO), Take 4 capsules by mouth daily. (Patient not taking: Reported on 05/05/2021), Disp: , Rfl:    polyethylene glycol powder (GLYCOLAX/MIRALAX) 17 GM/SCOOP powder,  dissolve  1 capful in water and take by daily as needed for mild constipation. (Patient not taking: Reported on 05/05/2021), Disp: 510 g, Rfl: 0   polyvinyl alcohol (LIQUIFILM TEARS) 1.4 % ophthalmic solution, Place 1 drop into both eyes daily as needed for dry eyes., Disp: , Rfl:    Pyridoxine HCl (VITAMIN B-6 PO), Take 1 tablet by mouth daily., Disp: , Rfl:    rifampin (RIFADIN) 300 MG capsule, Take 2 capsules (600 mg total) by mouth every morning., Disp: 60 capsule, Rfl: 5   triamcinolone (KENALOG) 0.025 % ointment, Apply 1 application topically 2 (two) times daily. (Patient not taking: Reported on 05/05/2021), Disp: 30 g, Rfl: 0  Past Medical History: Past Medical History:  Diagnosis Date   Bronchiectasis (HCC)    Hypertension    Pulmonary TB 2018    Tobacco Use: Social History   Tobacco Use  Smoking Status Never  Smokeless Tobacco Never    Labs: Recent Review Flowsheet Data     Labs for ITP Cardiac and Pulmonary Rehab Latest Ref Rng & Units 03/12/2021   Hemoglobin A1c 4.8 - 5.6 % 6.3(H)        Pulmonary Assessment Scores:  Pulmonary Assessment Scores     Row Name 05/15/21 1645         ADL UCSD   SOB Score total 106     Rest 5     Walk 5     Stairs 5     Bath 4  Dress 4     Shop 5           CAT Score     CAT Score 29           mMRC Score     mMRC Score 4             UCSD: Self-administered rating of dyspnea associated with activities of daily living (ADLs) 6-point scale (0 = "not at all" to 5 = "maximal or unable to do because of breathlessness")  Scoring Scores range from 0 to 120.  Minimally important difference is 5 units  CAT: CAT can identify the health impairment of COPD patients and is better correlated with disease progression.  CAT has a scoring range of zero to 40. The CAT score is classified into four groups of low (less than 10), medium (10 - 20), high (21-30) and very high (31-40) based on the impact level of disease on health  status. A CAT score over 10 suggests significant symptoms.  A worsening CAT score could be explained by an exacerbation, poor medication adherence, poor inhaler technique, or progression of COPD or comorbid conditions.  CAT MCID is 2 points  mMRC: mMRC (Modified Medical Research Council) Dyspnea Scale is used to assess the degree of baseline functional disability in patients of respiratory disease due to dyspnea. No minimal important difference is established. A decrease in score of 1 point or greater is considered a positive change.   Pulmonary Function Assessment:   Exercise Target Goals: Exercise Program Goal: Individual exercise prescription set using results from initial 6 min walk test and THRR while considering  patient's activity barriers and safety.   Exercise Prescription Goal: Initial exercise prescription builds to 30-45 minutes a day of aerobic activity, 2-3 days per week.  Home exercise guidelines will be given to patient during program as part of exercise prescription that the participant will acknowledge.  Education: Aerobic Exercise: - Group verbal and visual presentation on the components of exercise prescription. Introduces F.I.T.T principle from ACSM for exercise prescriptions.  Reviews F.I.T.T. principles of aerobic exercise including progression. Written material given at graduation.   Education: Resistance Exercise: - Group verbal and visual presentation on the components of exercise prescription. Introduces F.I.T.T principle from ACSM for exercise prescriptions  Reviews F.I.T.T. principles of resistance exercise including progression. Written material given at graduation.    Education: Exercise & Equipment Safety: - Individual verbal instruction and demonstration of equipment use and safety with use of the equipment. Flowsheet Row Pulmonary Rehab from 05/15/2021 in [facility] Cardiac and Pulmonary Rehab  Date 05/15/21  Educator AS  Instruction Review Code 1- Verbalizes  Understanding       Education: Exercise Physiology & General Exercise Guidelines: - Group verbal and written instruction with models to review the exercise physiology of the cardiovascular system and associated critical values. Provides general exercise guidelines with specific guidelines to those with heart or lung disease.    Education: Flexibility, Balance, Mind/Body Relaxation: - Group verbal and visual presentation with interactive activity on the components of exercise prescription. Introduces F.I.T.T principle from ACSM for exercise prescriptions. Reviews F.I.T.T. principles of flexibility and balance exercise training including progression. Also discusses the mind body connection.  Reviews various relaxation techniques to help reduce and manage stress (i.e. Deep breathing, progressive muscle relaxation, and visualization). Balance handout provided to take home. Written material given at graduation.   Activity Barriers & Risk Stratification:  Activity Barriers & Cardiac Risk Stratification - 05/05/21 1419  Activity Barriers & Cardiac Risk Stratification   Activity Barriers Muscular Weakness;Shortness of Breath   Leg weak            6 Minute Walk:  6 Minute Walk     Row Name 05/15/21 1637         6 Minute Walk   Distance 712 feet     Walk Time 4.5 minutes     # of Rest Breaks 2     MPH 1.8     METS 2.47     RPE 20     Perceived Dyspnea  4     VO2 Peak 8.64     Symptoms Yes (comment)     Comments SOB     Resting HR 96 bpm     Resting BP 106/60     Resting Oxygen Saturation  95 %     Exercise Oxygen Saturation  during 6 min walk 94 %     Max Ex. HR 114 bpm     Max Ex. BP 134/64     2 Minute Post BP 114/64           Interval HR     1 Minute HR 110     2 Minute HR 108     3 Minute HR 111     4 Minute HR 108     5 Minute HR 111     6 Minute HR 114     2 Minute Post HR 99     Interval Heart Rate? Yes           Interval Oxygen     Interval Oxygen?  Yes     Baseline Oxygen Saturation % 95 %     1 Minute Oxygen Saturation % 95 %     2 Minute Oxygen Saturation % 93 %     3 Minute Oxygen Saturation % 95 %     4 Minute Oxygen Saturation % 94 %     5 Minute Oxygen Saturation % 96 %     6 Minute Oxygen Saturation % 94 %     2 Minute Post Oxygen Saturation % 96 %            Oxygen Initial Assessment:  Oxygen Initial Assessment - 05/05/21 1420       Home Oxygen   Home Oxygen Device None    Sleep Oxygen Prescription None    Home Exercise Oxygen Prescription None    Home Resting Oxygen Prescription None    Compliance with Home Oxygen Use Yes      Intervention   Short Term Goals To learn and understand importance of maintaining oxygen saturations>88%;To learn and demonstrate proper pursed lip breathing techniques or other breathing techniques. ;To learn and demonstrate proper use of respiratory medications    Long  Term Goals Exhibits compliance with exercise, home  and travel O2 prescription;Exhibits proper breathing techniques, such as pursed lip breathing or other method taught during program session;Compliance with respiratory medication;Demonstrates proper use of MDI's             Oxygen Re-Evaluation:   Oxygen Discharge (Final Oxygen Re-Evaluation):   Initial Exercise Prescription:  Initial Exercise Prescription - 05/15/21 1600       Date of Initial Exercise RX and Referring Provider   Date 05/15/21    Referring Provider Orvan Falconerampbell      Treadmill   MPH 1.5    Grade 0    Minutes 15  METs 2      Recumbant Bike   Level 1    RPM 60    Minutes 15    METs 2      NuStep   Level 1    SPM 80    Minutes 15    METs 2      Biostep-RELP   Level 1    SPM 50    Minutes 15    METs 2      Prescription Details   Frequency (times per week) 3    Duration Progress to 30 minutes of continuous aerobic without signs/symptoms of physical distress      Intensity   THRR 40-80% of Max Heartrate 119-142    Ratings of  Perceived Exertion 11-15    Perceived Dyspnea 0-4      Progression   Progression Continue to progress workloads to maintain intensity without signs/symptoms of physical distress.      Resistance Training   Training Prescription Yes    Weight 3 lb    Reps 10-15             Perform Capillary Blood Glucose checks as needed.  Exercise Prescription Changes:   Exercise Prescription Changes     Row Name 05/15/21 1600             Response to Exercise   Blood Pressure (Admit) 106/60       Blood Pressure (Exercise) 134/64       Blood Pressure (Exit) 114/64       Heart Rate (Admit) 96 bpm       Heart Rate (Exercise) 114 bpm       Heart Rate (Exit) 99 bpm       Oxygen Saturation (Admit) 95 %       Oxygen Saturation (Exercise) 93 %       Oxygen Saturation (Exit) 96 %       Rating of Perceived Exertion (Exercise) 20       Perceived Dyspnea (Exercise) 4       Symptoms SOB                Exercise Comments:   Exercise Goals and Review:   Exercise Goals     Row Name 05/15/21 1642             Exercise Goals   Increase Physical Activity Yes       Intervention Provide advice, education, support and counseling about physical activity/exercise needs.;Develop an individualized exercise prescription for aerobic and resistive training based on initial evaluation findings, risk stratification, comorbidities and participant's personal goals.       Expected Outcomes Short Term: Attend rehab on a regular basis to increase amount of physical activity.;Long Term: Add in home exercise to make exercise part of routine and to increase amount of physical activity.;Long Term: Exercising regularly at least 3-5 days a week.       Increase Strength and Stamina Yes       Intervention Provide advice, education, support and counseling about physical activity/exercise needs.;Develop an individualized exercise prescription for aerobic and resistive training based on initial evaluation findings,  risk stratification, comorbidities and participant's personal goals.       Expected Outcomes Short Term: Increase workloads from initial exercise prescription for resistance, speed, and METs.;Long Term: Improve cardiorespiratory fitness, muscular endurance and strength as measured by increased METs and functional capacity ( )       Able to understand and use rate of perceived exertion (RPE)  scale Yes       Intervention Provide education and explanation on how to use RPE scale       Expected Outcomes Short Term: Able to use RPE daily in rehab to express subjective intensity level;Long Term:  Able to use RPE to guide intensity level when exercising independently       Able to understand and use Dyspnea scale Yes       Intervention Provide education and explanation on how to use Dyspnea scale       Expected Outcomes Short Term: Able to use Dyspnea scale daily in rehab to express subjective sense of shortness of breath during exertion;Long Term: Able to use Dyspnea scale to guide intensity level when exercising independently       Knowledge and understanding of Target Heart Rate Range (THRR) Yes       Intervention Provide education and explanation of THRR including how the numbers were predicted and where they are located for reference       Expected Outcomes Short Term: Able to state/look up THRR;Short Term: Able to use daily as guideline for intensity in rehab;Long Term: Able to use THRR to govern intensity when exercising independently       Able to check pulse independently Yes       Intervention Provide education and demonstration on how to check pulse in carotid and radial arteries.;Review the importance of being able to check your own pulse for safety during independent exercise       Expected Outcomes Short Term: Able to explain why pulse checking is important during independent exercise;Long Term: Able to check pulse independently and accurately       Understanding of Exercise Prescription Yes        Intervention Provide education, explanation, and written materials on patient's individual exercise prescription       Expected Outcomes Short Term: Able to explain program exercise prescription;Long Term: Able to explain home exercise prescription to exercise independently                Exercise Goals Re-Evaluation :   Discharge Exercise Prescription (Final Exercise Prescription Changes):  Exercise Prescription Changes - 05/15/21 1600       Response to Exercise   Blood Pressure (Admit) 106/60    Blood Pressure (Exercise) 134/64    Blood Pressure (Exit) 114/64    Heart Rate (Admit) 96 bpm    Heart Rate (Exercise) 114 bpm    Heart Rate (Exit) 99 bpm    Oxygen Saturation (Admit) 95 %    Oxygen Saturation (Exercise) 93 %    Oxygen Saturation (Exit) 96 %    Rating of Perceived Exertion (Exercise) 20    Perceived Dyspnea (Exercise) 4    Symptoms SOB             Nutrition:  Target Goals: Understanding of nutrition guidelines, daily intake of sodium 1500mg , cholesterol 200mg , calories 30% from fat and 7% or less from saturated fats, daily to have 5 or more servings of fruits and vegetables.  Education: All About Nutrition: -Group instruction provided by verbal, written material, interactive activities, discussions, models, and posters to present general guidelines for heart healthy nutrition including fat, fiber, MyPlate, the role of sodium in heart healthy nutrition, utilization of the nutrition label, and utilization of this knowledge for meal planning. Follow up email sent as well. Written material given at graduation.   Biometrics:  Pre Biometrics - 05/15/21 1643       Pre Biometrics  Height 5\' 1"  (1.549 m)    Weight 109 lb (49.4 kg)    BMI (Calculated) 20.61    Single Leg Stand 30 seconds              Nutrition Therapy Plan and Nutrition Goals:   Nutrition Assessments:  MEDIFICTS Score Key: ?70 Need to make dietary changes  40-70 Heart  Healthy Diet ? 40 Therapeutic Level Cholesterol Diet   Picture Your Plate Scores: Unhealthy dietary pattern with much room for improvement. 41-50 Dietary pattern unlikely to meet recommendations for good health and room for improvement. 51-60 More healthful dietary pattern, with some room for improvement.  >60 Healthy dietary pattern, although there may be some specific behaviors that could be improved.   Nutrition Goals Re-Evaluation:   Nutrition Goals Discharge (Final Nutrition Goals Re-Evaluation):   Psychosocial: Target Goals: Acknowledge presence or absence of significant depression and/or stress, maximize coping skills, provide positive support system. Participant is able to verbalize types and ability to use techniques and skills needed for reducing stress and depression.   Education: Stress, Anxiety, and Depression - Group verbal and visual presentation to define topics covered.  Reviews how body is impacted by stress, anxiety, and depression.  Also discusses healthy ways to reduce stress and to treat/manage anxiety and depression.  Written material given at graduation.   Education: Sleep Hygiene -Provides group verbal and written instruction about how sleep can affect your health.  Define sleep hygiene, discuss sleep cycles and impact of sleep habits. Review good sleep hygiene tips.    Initial Review & Psychosocial Screening:  Initial Psych Review & Screening - 05/05/21 1422       Initial Review   Current issues with Current Stress Concerns    Source of Stress Concerns Chronic Illness    Comments Sometimes feelings of desperation about my illness.      Family Dynamics   Good Support System? Yes   2 sisters, 40 min and 1 hour away. Both come weekly to help.     Barriers   Psychosocial barriers to participate in program There are no identifiable barriers or psychosocial needs.      Screening Interventions   Interventions Encouraged to exercise    Expected  Outcomes Short Term goal: Utilizing psychosocial counselor, staff and physician to assist with identification of specific Stressors or current issues interfering with healing process. Setting desired goal for each stressor or current issue identified.;Long Term Goal: Stressors or current issues are controlled or eliminated.;Short Term goal: Identification and review with participant of any Quality of Life or Depression concerns found by scoring the questionnaire.;Long Term goal: The participant improves quality of Life and PHQ9 Scores as seen by post scores and/or verbalization of changes             Quality of Life Scores:  Scores of 19 and below usually indicate a poorer quality of life in these areas.  A difference of  2-3 points is a clinically meaningful difference.  A difference of 2-3 points in the total score of the Quality of Life Index has been associated with significant improvement in overall quality of life, self-image, physical symptoms, and general health in studies assessing change in quality of life.  PHQ-9: Recent Review Flowsheet Data     Depression screen [facility] 2/9 05/15/2021 05/03/2021 04/26/2021 03/28/2021 02/21/2021   Decreased Interest 3 0 0 0 0   Down, Depressed, Hopeless 2 0 0 0 0   PHQ - 2 Score 5 0 0  0 0   Altered sleeping 0 - - - -   Tired, decreased energy 3 - - - -   Change in appetite 2 - - - -   Feeling bad or failure about yourself  0 - - - -   Trouble concentrating 0 - - - -   Moving slowly or fidgety/restless 0 - - - -   Suicidal thoughts 0 - - - -   PHQ-9 Score 10 - - - -      Interpretation of Total Score  Total Score Depression Severity:  1-4 = Minimal depression, 5-9 = Mild depression, 10-14 = Moderate depression, 15-19 = Moderately severe depression, 20-27 = Severe depression   Psychosocial Evaluation and Intervention:  Psychosocial Evaluation - 05/05/21 1441       Psychosocial Evaluation & Interventions   Interventions Encouraged to exercise with  the program and follow exercise prescription    Comments Victoria Mendoza has no barriers to attending the program. She lives alone. She has 2 sisters that live an hour or less away that are at her home weekly or more to help her. She wants to attend and see if she can improve her shortness of breath symptoms and improove her activitiy levels.  She states she does not have depression, but does some time feel a desperation over her chronic illness. She is limited in her activities because of her shortness of breath. SHe does listen to music and watch some TV to keep herslf occupied.   She should do well in the program as she is ready to work on doing what she can to improve her activity levels. She is already using a treadmill at home for short sessions.    Expected Outcomes STG: Ashlyne attends all scheduled sessions. She is able to progress with her exercise levels and gain staminaand decreased shortness of breath. LTG: Lashonna continues with her progress to enable her to be more active longer periods of time.    Continue Psychosocial Services  Follow up required by staff             Psychosocial Re-Evaluation:   Psychosocial Discharge (Final Psychosocial Re-Evaluation):   Education: Education Goals: Education classes will be provided on a weekly basis, covering required topics. Participant will state understanding/return demonstration of topics presented.  Learning Barriers/Preferences:  Learning Barriers/Preferences - 05/05/21 1430       Learning Barriers/Preferences   Learning Barriers None    Learning Preferences None             General Pulmonary Education Topics:  Infection Prevention: - Provides verbal and written material to individual with discussion of infection control including proper hand washing and proper equipment cleaning during exercise session. Flowsheet Row Pulmonary Rehab from 05/15/2021 in [facility] Cardiac and Pulmonary Rehab  Date 05/15/21  Educator AS  Instruction  Review Code 1- Verbalizes Understanding       Falls Prevention: - Provides verbal and written material to individual with discussion of falls prevention and safety. Flowsheet Row Pulmonary Rehab from 05/15/2021 in [facility] Cardiac and Pulmonary Rehab  Date 05/15/21  Educator AS  Instruction Review Code 1- Verbalizes Understanding       Chronic Lung Disease Review: - Group verbal instruction with posters, models, PowerPoint presentations and videos,  to review new updates, new respiratory medications, new advancements in procedures and treatments. Providing information on websites and "800" numbers for continued self-education. Includes information about supplement oxygen, available portable oxygen systems, continuous and intermittent flow rates, oxygen safety,  concentrators, and Medicare reimbursement for oxygen. Explanation of Pulmonary Drugs, including class, frequency, complications, importance of spacers, rinsing mouth after steroid MDI's, and proper cleaning methods for nebulizers. Review of basic lung anatomy and physiology related to function, structure, and complications of lung disease. Review of risk factors. Discussion about methods for diagnosing sleep apnea and types of masks and machines for OSA. Includes a review of the use of types of environmental controls: home humidity, furnaces, filters, dust mite/pet prevention, HEPA vacuums. Discussion about weather changes, air quality and the benefits of nasal washing. Instruction on Warning signs, infection symptoms, calling MD promptly, preventive modes, and value of vaccinations. Review of effective airway clearance, coughing and/or vibration techniques. Emphasizing that all should Create an Action Plan. Written material given at graduation.   AED/CPR: - Group verbal and written instruction with the use of models to demonstrate the basic use of the AED with the basic ABC's of resuscitation.    Anatomy and Cardiac Procedures: - Group  verbal and visual presentation and models provide information about basic cardiac anatomy and function. Reviews the testing methods done to diagnose heart disease and the outcomes of the test results. Describes the treatment choices: Medical Management, Angioplasty, or Coronary Bypass Surgery for treating various heart conditions including Myocardial Infarction, Angina, Valve Disease, and Cardiac Arrhythmias.  Written material given at graduation.   Medication Safety: - Group verbal and visual instruction to review commonly prescribed medications for heart and lung disease. Reviews the medication, class of the drug, and side effects. Includes the steps to properly store meds and maintain the prescription regimen.  Written material given at graduation.   Other: -Provides group and verbal instruction on various topics (see comments)   Knowledge Questionnaire Score:  Knowledge Questionnaire Score - 05/15/21 1647       Knowledge Questionnaire Score   Pre Score 4/18              Core Components/Risk Factors/Patient Goals at Admission:  Personal Goals and Risk Factors at Admission - 05/15/21 1644       Core Components/Risk Factors/Patient Goals on Admission    Weight Management Yes;Weight Gain    Intervention Weight Management: Develop a combined nutrition and exercise program designed to reach desired caloric intake, while maintaining appropriate intake of nutrient and fiber, sodium and fats, and appropriate energy expenditure required for the weight goal.;Weight Management: Provide education and appropriate resources to help participant work on and attain dietary goals.    Admit Weight 108 lb (49 kg)    Goal Weight: Short Term 115 lb (52.2 kg)    Goal Weight: Long Term 115 lb (52.2 kg)    Expected Outcomes Short Term: Continue to assess and modify interventions until short term weight is achieved;Long Term: Adherence to nutrition and physical activity/exercise program aimed toward  attainment of established weight goal;Weight Gain: Understanding of general recommendations for a high calorie, high protein meal plan that promotes weight gain by distributing calorie intake throughout the day with the consumption for 4-5 meals, snacks, and/or supplements    Intervention Provide education, individualized exercise plan and daily activity instruction to help decrease symptoms of SOB with activities of daily living.    Expected Outcomes Short Term: Improve cardiorespiratory fitness to achieve a reduction of symptoms when performing ADLs;Long Term: Be able to perform more ADLs without symptoms or delay the onset of symptoms    Increase knowledge of respiratory medications and ability to use respiratory devices properly  Yes    Intervention Provide  education and demonstration as needed of appropriate use of medications, inhalers, and oxygen therapy.    Expected Outcomes Short Term: Achieves understanding of medications use. Understands that oxygen is a medication prescribed by physician. Demonstrates appropriate use of inhaler and oxygen therapy.;Long Term: Maintain appropriate use of medications, inhalers, and oxygen therapy.             Education:Diabetes - Individual verbal and written instruction to review signs/symptoms of diabetes, desired ranges of glucose level fasting, after meals and with exercise. Acknowledge that pre and post exercise glucose checks will be done for 3 sessions at entry of program.   Know Your Numbers and Heart Failure: - Group verbal and visual instruction to discuss disease risk factors for cardiac and pulmonary disease and treatment options.  Reviews associated critical values for Overweight/Obesity, Hypertension, Cholesterol, and Diabetes.  Discusses basics of heart failure: signs/symptoms and treatments.  Introduces Heart Failure Zone chart for action plan for heart failure.  Written material given at graduation.   Core Components/Risk  Factors/Patient Goals Review:    Core Components/Risk Factors/Patient Goals at Discharge (Final Review):    ITP Comments:  ITP Comments     Row Name 05/05/21 1447           ITP Comments Virtual orientation call completed today. shehas an appointment on Date: 05/15/2021  for EP eval and gym Orientation.  Documentation of diagnosis [facility] be found in [facility]  Date: 04/26/2021 and 03/28/2021 .                Comments: initial ITP

## 2021-05-22 ENCOUNTER — Other Ambulatory Visit: Payer: Self-pay

## 2021-05-22 DIAGNOSIS — J432 Centrilobular emphysema: Secondary | ICD-10-CM | POA: Diagnosis not present

## 2021-05-22 NOTE — Progress Notes (Signed)
Daily Session Note  Patient Details  Name: Victoria Mendoza MRN: [id number] Date of Birth: 10/29/1956 Referring Provider:   Flowsheet Row Pulmonary Rehab from 05/15/2021 in [facility] Cardiac and Pulmonary Rehab  Referring Provider Campbell       Encounter Date: 05/22/2021  Check In:  Session Check In - 05/22/21 1421       Check-In   Supervising physician immediately available to respond to emergencies See telemetry face sheet for immediately available ER MD    Location [facility]Cardiac & Pulmonary Rehab    Staff Present Kelly Bollinger, MPA, RN;Kelly Hayes, BS, ACSM CEP, Exercise Physiologist;Kara Langdon, MS, ASCM CEP, Exercise Physiologist    Virtual Visit No    Medication changes reported     No    Fall or balance concerns reported    No    Warm-up and Cool-down Performed on first and last piece of equipment    Resistance Training Performed Yes    VAD Patient? No    PAD/SET Patient? No      Pain Assessment   Currently in Pain? No/denies                Social History   Tobacco Use  Smoking Status Never  Smokeless Tobacco Never    Goals Met:  Independence with exercise equipment Exercise tolerated well No report of cardiac concerns or symptoms Strength training completed today  Goals Unmet:  Not Applicable  Comments: First full day of exercise!  Patient was oriented to gym and equipment including functions, settings, policies, and procedures.  Patient's individual exercise prescription and treatment plan were reviewed.  All starting workloads were established based on the results of the 6 minute walk test done at initial orientation visit.  The plan for exercise progression was also introduced and progression will be customized based on patient's performance and goals.    Dr. Mark Miller is Medical Director for [facility].  Dr. Fuad Aleskerov is Medical Director for [facility]. 

## 2021-05-23 ENCOUNTER — Other Ambulatory Visit: Payer: Self-pay | Admitting: Internal Medicine

## 2021-05-24 ENCOUNTER — Other Ambulatory Visit: Payer: Self-pay

## 2021-05-24 DIAGNOSIS — J432 Centrilobular emphysema: Secondary | ICD-10-CM | POA: Diagnosis not present

## 2021-05-24 NOTE — Progress Notes (Signed)
Daily Session Note  Patient Details  Name: Victoria Mendoza MRN: [id number] Date of Birth: [birth year] Referring Provider:   Flowsheet Row Pulmonary Rehab from 05/15/2021 in [facility] Cardiac and Pulmonary Rehab  Referring Provider Megan Salon       Encounter Date: 05/24/2021  Check In:  Session Check In - 05/24/21 1451       Check-In   Supervising physician immediately available to respond to emergencies See telemetry face sheet for immediately available ER MD    Location [facility]    Staff Present Birdie Sons, MPA, Nino Glow, MS, ASCM CEP, Exercise Physiologist;Joseph Tessie Fass, RCP,RRT,BSRT;Kristen [place], RN,BC,MSN    Virtual Visit No    Medication changes reported     No    Fall or balance concerns reported    No    Warm-up and Cool-down Performed on first and last piece of equipment    Resistance Training Performed Yes    VAD Patient? No    PAD/SET Patient? No      Pain Assessment   Currently in Pain? No/denies                Social History   Tobacco Use  Smoking Status Never  Smokeless Tobacco Never    Goals Met:  Independence with exercise equipment Exercise tolerated well No report of cardiac concerns or symptoms Strength training completed today  Goals Unmet:  Not Applicable  Comments: Pt able to follow exercise prescription today without complaint.  Will continue to monitor for progression.    Dr. Emily Filbert is Medical Director for [facility].  Dr. Ottie Glazier is Medical Director for [facility] Pulmonary Rehabilitation.

## 2021-05-25 ENCOUNTER — Encounter: Payer: Self-pay | Admitting: Internal Medicine

## 2021-05-25 ENCOUNTER — Telehealth: Payer: Self-pay

## 2021-05-25 ENCOUNTER — Telehealth (INDEPENDENT_AMBULATORY_CARE_PROVIDER_SITE_OTHER): Payer: 59 | Admitting: Internal Medicine

## 2021-05-25 ENCOUNTER — Other Ambulatory Visit: Payer: Self-pay

## 2021-05-25 ENCOUNTER — Encounter: Payer: Medicare HMO | Admitting: *Deleted

## 2021-05-25 DIAGNOSIS — A310 Pulmonary mycobacterial infection: Secondary | ICD-10-CM | POA: Diagnosis not present

## 2021-05-25 DIAGNOSIS — J432 Centrilobular emphysema: Secondary | ICD-10-CM

## 2021-05-25 NOTE — Progress Notes (Signed)
Daily Session Note  Patient Details  Name: Victoria Mendoza MRN: [id number] Date of Birth: May 27, 1956 Referring Provider:   Flowsheet Row Pulmonary Rehab from 05/15/2021 in [facility] Cardiac and Pulmonary Rehab  Referring Provider Megan Salon       Encounter Date: 05/25/2021  Check In:  Session Check In - 05/25/21 1334       Check-In   Supervising physician immediately available to respond to emergencies See telemetry face sheet for immediately available ER MD    Location [facility]    Staff Present Renita Papa, RN BSN;Joseph [place]ov[place], RCP,RRT,BSRT;Jessica [place], [place], RCEP, CCRP, CCET    Virtual Visit No    Medication changes reported     No    Fall or balance concerns reported    No    Warm-up and Cool-down Performed on first and last piece of equipment    Resistance Training Performed Yes    VAD Patient? No    PAD/SET Patient? No      Pain Assessment   Currently in Pain? No/denies                Social History   Tobacco Use  Smoking Status Never  Smokeless Tobacco Never    Goals Met:  Independence with exercise equipment Exercise tolerated well No report of cardiac concerns or symptoms Strength training completed today  Goals Unmet:  Not Applicable  Comments: Pt able to follow exercise prescription today without complaint.  Will continue to monitor for progression.    Dr. Emily Filbert is Medical Director for [facility].  Dr. Ottie Glazier is Medical Director for [facility] Pulmonary Rehabilitation.

## 2021-05-25 NOTE — Progress Notes (Signed)
Virtual Visit via Telephone Note  I connected with Victoria Mendoza on 05/25/21 at 11:15 AM EDT by telephone and verified that I am speaking with the correct person using two identifiers.  Location: Patient: Home Provider: [facility]   I discussed the limitations, risks, security and privacy concerns of performing an evaluation and management service by telephone and the availability of in person appointments. I also discussed with the patient that there may be a patient responsible charge related to this service. The patient expressed understanding and agreed to proceed.   History of Present Illness: I called and spoke with Victoria Mendoza today.  She continues on azithromycin, ethambutol and rifampin which she started in May 2021.  She continues on IV antibiotics which were started on 02/21/2021.  She is taking tigecycline, amikacin and cefoxitin.  She says that she feels like she is doing worse.  She says that her fatigue continues to worsen and is now "over the roof".  She says she notes increasing cough productive of dark phlegm.  Her dyspnea on exertion is unchanged.  She started pulmonary rehab recently and will have her third session today.  She is not having any problems tolerating her PICC.  She is not having any fever, nausea, vomiting or diarrhea.  She is scheduled to follow-up with Dr. Zackery Barefoot at [facility] sometime in August.  Observations/Objective: Creatinine 05/15/2021 0.65 Sputum AFB 03/28/2021 no AFB seen on smear and cultures negative  Assessment and Plan: Although she feels weaker and more short of breath I do think she has gotten some benefit from her current 6 antibiotic regimen given that her sputum AFB culture has reverted to negative.  Follow Up Instructions: Continue current antibiotics Repeat sputum AFB culture Continue pulmonary rehab pulmonary rehab Follow-up in 4 weeks   I discussed the assessment and treatment plan with the patient. The patient was provided an opportunity to ask  questions and all were answered. The patient agreed with the plan and demonstrated an understanding of the instructions.   The patient was advised to call back or seek an in-person evaluation if the symptoms worsen or if the condition fails to improve as anticipated.  I provided 15 minutes of non-face-to-face time during this encounter.   Cliffton Asters, MD

## 2021-05-25 NOTE — Telephone Encounter (Signed)
Called [facility], requested that they fax two most recent sets of labs.   Sandie Ano, RN

## 2021-05-26 ENCOUNTER — Telehealth: Payer: Self-pay

## 2021-05-26 NOTE — Telephone Encounter (Signed)
Received call from [facility] with [facility]. Patient has been having increasing difficulty with PICC line as it will hardly flush and she is unable to get any blood return. Per Marchelle Folks, patient disconnected from her continuous infusion last night (05/25/21) around 11pm as the pump kept beeping due to flow issues.   Nursing will go out to give PRN cathflo per standing orders, but they are wondering if more long-term solution can be arranged such as PICC replacement as patient continues to have problems with current line. Will route to provider.   [facility] P: [phone]  Sandie Ano, RN

## 2021-05-30 ENCOUNTER — Encounter: Payer: Self-pay | Admitting: Internal Medicine

## 2021-05-30 NOTE — Telephone Encounter (Signed)
Spoke with Marchelle Folks at [facility], as of 05/29/21 patient's PICC line had good blood return and there was no need for Cathflo.   However, patient has not received IV antibiotics for approximately 5 days according to [facility] as [facility] had end date as 05/25/21. Per last office note, patient is to continue with all antibiotics. RN spoke to Jonny Ruiz, Teacher, early years/pre at [employer], and requested they please send out a shipment of the patient's medications. He says it should arrive sometime today, but they will need a new end date. Will route to provider.   Sandie Ano, RN

## 2021-05-30 NOTE — Telephone Encounter (Signed)
Spoke with Jonny Ruiz, pharmacist at [facility], and relayed tentative IV antibiotic end date of 06/22/21 per Dr. Orvan Falconer. Orders repeated and verified.   [facility] P: [phone]  Sandie Ano, RN

## 2021-05-31 ENCOUNTER — Encounter: Payer: Medicare HMO | Attending: Internal Medicine

## 2021-05-31 ENCOUNTER — Other Ambulatory Visit: Payer: Self-pay

## 2021-05-31 DIAGNOSIS — A318 Other mycobacterial infections: Secondary | ICD-10-CM

## 2021-05-31 DIAGNOSIS — A319 Mycobacterial infection, unspecified: Secondary | ICD-10-CM | POA: Diagnosis present

## 2021-05-31 DIAGNOSIS — J432 Centrilobular emphysema: Secondary | ICD-10-CM | POA: Diagnosis not present

## 2021-05-31 NOTE — Progress Notes (Signed)
Daily Session Note  Patient Details  Name: Victoria Mendoza MRN: [id number] Date of Birth: 07/08/1956 Referring Provider:   Flowsheet Row Pulmonary Rehab from 05/15/2021 in [facility] Re[facility]  Referring Provider Campbell       Encounter Date: 05/31/2021  Check In:  Session Check In - 05/31/21 1331       Check-In   Supervising physician immediately available to respond to emergencies See telemetry face sheet for immediately available ER MD    Location [facility]    Staff Present Kelly Bollinger, MPA, RN;Kara Langdon, MS, ASCM CEP, Exercise Physiologist;Joseph Hood, RCP,RRT,BSRT    Virtual Visit No    Medication changes reported     No    Fall or balance concerns reported    No    Warm-up and Cool-down Performed on first and last piece of equipment    Resistance Training Performed Yes    VAD Patient? No    PAD/SET Patient? No      Pain Assessment   Currently in Pain? No/denies                Social History   Tobacco Use  Smoking Status Never  Smokeless Tobacco Never    Goals Met:  Independence with exercise equipment Exercise tolerated well No report of cardiac concerns or symptoms Strength training completed today  Goals Unmet:  Not Applicable  Comments: Pt able to follow exercise prescription today without complaint.  Will continue to monitor for progression.    Dr. Mark Miller is Medical Director for [facility].  Dr. Fuad Aleskerov is Medical Director for [facility]. 

## 2021-06-01 ENCOUNTER — Encounter: Payer: Medicare HMO | Admitting: *Deleted

## 2021-06-01 ENCOUNTER — Encounter: Payer: Self-pay | Admitting: Internal Medicine

## 2021-06-01 ENCOUNTER — Other Ambulatory Visit: Payer: Self-pay

## 2021-06-01 DIAGNOSIS — J432 Centrilobular emphysema: Secondary | ICD-10-CM | POA: Diagnosis not present

## 2021-06-01 NOTE — Progress Notes (Signed)
Daily Session Note  Patient Details  Name: Victoria Mendoza MRN: [id number] Date of Birth: 03/02/1956 Referring Provider:   Flowsheet Row Pulmonary Rehab from 05/15/2021 in [facility] Cardiac and Pulmonary Rehab  Referring Provider Megan Salon       Encounter Date: 06/01/2021  Check In:  Session Check In - 06/01/21 1330       Check-In   Supervising physician immediately available to respond to emergencies See telemetry face sheet for immediately available ER MD    Location [facility]    Staff Present Renita Papa, RN BSN;Melissa [place], RDN, LDN;Jessica [place], MA, RCEP, CCRP, Marylynn Pearson, MS, ASCM CEP, Exercise Physiologist    Virtual Visit No    Medication changes reported     No    Fall or balance concerns reported    No    Warm-up and Cool-down Performed on first and last piece of equipment    Resistance Training Performed Yes    VAD Patient? No    PAD/SET Patient? No      Pain Assessment   Currently in Pain? No/denies                Social History   Tobacco Use  Smoking Status Never  Smokeless Tobacco Never    Goals Met:  Independence with exercise equipment Exercise tolerated well No report of cardiac concerns or symptoms Strength training completed today  Goals Unmet:  Not Applicable  Comments: Pt able to follow exercise prescription today without complaint.  Will continue to monitor for progression.    Dr. Emily Filbert is Medical Director for [facility].  Dr. Ottie Glazier is Medical Director for [facility] Pulmonary Rehabilitation.

## 2021-06-05 ENCOUNTER — Encounter: Payer: Medicare HMO | Admitting: *Deleted

## 2021-06-05 ENCOUNTER — Other Ambulatory Visit: Payer: Self-pay

## 2021-06-05 DIAGNOSIS — A318 Other mycobacterial infections: Secondary | ICD-10-CM

## 2021-06-05 DIAGNOSIS — A319 Mycobacterial infection, unspecified: Secondary | ICD-10-CM

## 2021-06-05 DIAGNOSIS — J432 Centrilobular emphysema: Secondary | ICD-10-CM | POA: Diagnosis not present

## 2021-06-05 NOTE — Progress Notes (Signed)
Daily Session Note  Patient Details  Name: Victoria Mendoza MRN: [id number] Date of Birth: Sep 21, 1956 Referring Provider:   Flowsheet Row Pulmonary Rehab from 05/15/2021 in [facility] Cardiac and Pulmonary Rehab  Referring Provider Megan Salon       Encounter Date: 06/05/2021  Check In:  Session Check In - 06/05/21 1403       Check-In   Supervising physician immediately available to respond to emergencies See telemetry face sheet for immediately available ER MD    Location [facility] Re[facility]    Staff Present Heath Lark, RN, BSN, Laveda Norman, BS, ACSM CEP, Exercise Physiologist;Kelly Rosalia Hammers, MPA, RN    Virtual Visit No    Medication changes reported     No    Fall or balance concerns reported    No    Warm-up and Cool-down Performed on first and last piece of equipment    Resistance Training Performed Yes    VAD Patient? No    PAD/SET Patient? No      Pain Assessment   Currently in Pain? No/denies                Social History   Tobacco Use  Smoking Status Never  Smokeless Tobacco Never    Goals Met:  Proper associated with RPD/PD & O2 Sat Independence with exercise equipment Exercise tolerated well No report of cardiac concerns or symptoms  Goals Unmet:  Not Applicable  Comments: Pt able to follow exercise prescription today without complaint.  Will continue to monitor for progression.    Dr. Emily Filbert is Medical Director for [facility].  Dr. Ottie Glazier is Medical Director for [facility] Pulmonary Rehabilitation.

## 2021-06-07 ENCOUNTER — Other Ambulatory Visit: Payer: Self-pay

## 2021-06-07 ENCOUNTER — Encounter: Payer: Medicare HMO | Admitting: *Deleted

## 2021-06-07 ENCOUNTER — Encounter: Payer: Self-pay | Admitting: *Deleted

## 2021-06-07 DIAGNOSIS — J432 Centrilobular emphysema: Secondary | ICD-10-CM

## 2021-06-07 DIAGNOSIS — A319 Mycobacterial infection, unspecified: Secondary | ICD-10-CM

## 2021-06-07 DIAGNOSIS — A318 Other mycobacterial infections: Secondary | ICD-10-CM

## 2021-06-07 NOTE — Progress Notes (Signed)
Pulmonary Individual Treatment Plan  Patient Details  Name: Victoria Mendoza MRN: [id number] Date of Birth: 03-26-1956 Referring Provider:   Flowsheet Row Pulmonary Rehab from 05/15/2021 in [facility] Cardiac and Pulmonary Rehab  Referring Provider Orvan Falconer       Initial Encounter Date:  Flowsheet Row Pulmonary Rehab from 05/15/2021 in [facility] Cardiac and Pulmonary Rehab  Date 05/15/21       Visit Diagnosis: Centrilobular emphysema (HCC)  Mycobacterium abscessus infection  Patient's Home Medications on Admission:  Current Outpatient Medications:    albuterol (PROVENTIL) (2.5 MG/3ML) 0.083% nebulizer solution, Take 3 mLs (2.5 mg total) by nebulization every four hours as needed for wheezing or shortness of breath., Disp: 90 mL, Rfl: 12   albuterol (VENTOLIN HFA) 108 (90 Base) MCG/ACT inhaler, INHALE 2 PUFFS INTO THE LUNGS EVERY 6 HOURS AS NEEDED FOR WHEEZING OR SHORTNESS OF BREATH, Disp: 8.5 g, Rfl: 0   azithromycin (ZITHROMAX) 250 MG tablet, Take 1 tablet (250 mg total) by mouth daily., Disp: 30 tablet, Rfl: 5   azithromycin (ZITHROMAX) 500 MG tablet, Take 500 mg by mouth daily., Disp: , Rfl:    B Complex Vitamins (VITAMIN B COMPLEX) TABS, Take 1 tablet by mouth daily., Disp: , Rfl:    ethambutol (MYAMBUTOL) 400 MG tablet, Take 2.5 tablets (1,000 mg total) by mouth daily., Disp: 75 tablet, Rfl: 5   hydrOXYzine (ATARAX/VISTARIL) 25 MG tablet, Take 1 tablet (25 mg total) by mouth as needed (one time dose for increased anxiety). (Patient not taking: Reported on 05/25/2021), Disp: 30 tablet, Rfl: 0   Misc. Devices (PULSE OXIMETER) MISC, 1 Units by Does not apply route as needed., Disp: 1 each, Rfl: 0   Multiple Vitamin (MULTIVITAMIN WITH MINERALS) TABS tablet, Take 1 tablet by mouth daily., Disp: , Rfl:    Omega-3 Fatty Acids (OMEGA 3 PO), Take 4 capsules by mouth daily. (Patient not taking: No sig reported), Disp: , Rfl:    polyethylene glycol powder (GLYCOLAX/MIRALAX) 17 GM/SCOOP powder, dissolve  1  capful in water and take by daily as needed for mild constipation. (Patient not taking: No sig reported), Disp: 510 g, Rfl: 0   polyvinyl alcohol (LIQUIFILM TEARS) 1.4 % ophthalmic solution, Place 1 drop into both eyes daily as needed for dry eyes., Disp: , Rfl:    Pyridoxine HCl (VITAMIN B-6 PO), Take 1 tablet by mouth daily., Disp: , Rfl:    rifampin (RIFADIN) 300 MG capsule, Take 2 capsules (600 mg total) by mouth every morning., Disp: 60 capsule, Rfl: 5   triamcinolone (KENALOG) 0.025 % ointment, Apply 1 application topically 2 (two) times daily. (Patient not taking: No sig reported), Disp: 30 g, Rfl: 0  Past Medical History: Past Medical History:  Diagnosis Date   Bronchiectasis (HCC)    Hypertension    Pulmonary TB 2018    Tobacco Use: Social History   Tobacco Use  Smoking Status Never  Smokeless Tobacco Never    Labs: Recent Review Flowsheet Data     Labs for ITP Cardiac and Pulmonary Rehab Latest Ref Rng & Units 03/12/2021   Hemoglobin A1c 4.8 - 5.6 % 6.3(H)        Pulmonary Assessment Scores:  Pulmonary Assessment Scores     Row Name 05/15/21 1645         ADL UCSD   SOB Score total 106     Rest 5     Walk 5     Stairs 5     Bath 4     Dress  4     Shop 5           CAT Score     CAT Score 29           mMRC Score     mMRC Score 4             UCSD: Self-administered rating of dyspnea associated with activities of daily living (ADLs) 6-point scale (0 = "not at all" to 5 = "maximal or unable to do because of breathlessness")  Scoring Scores range from 0 to 120.  Minimally important difference is 5 units  CAT: CAT can identify the health impairment of COPD patients and is better correlated with disease progression.  CAT has a scoring range of zero to 40. The CAT score is classified into four groups of low (less than 10), medium (10 - 20), high (21-30) and very high (31-40) based on the impact level of disease on health status. A CAT score over 10  suggests significant symptoms.  A worsening CAT score could be explained by an exacerbation, poor medication adherence, poor inhaler technique, or progression of COPD or comorbid conditions.  CAT MCID is 2 points  mMRC: mMRC (Modified Medical Research Council) Dyspnea Scale is used to assess the degree of baseline functional disability in patients of respiratory disease due to dyspnea. No minimal important difference is established. A decrease in score of 1 point or greater is considered a positive change.   Pulmonary Function Assessment:   Exercise Target Goals: Exercise Program Goal: Individual exercise prescription set using results from initial 6 min walk test and THRR while considering  patient's activity barriers and safety.   Exercise Prescription Goal: Initial exercise prescription builds to 30-45 minutes a day of aerobic activity, 2-3 days per week.  Home exercise guidelines will be given to patient during program as part of exercise prescription that the participant will acknowledge.  Education: Aerobic Exercise: - Group verbal and visual presentation on the components of exercise prescription. Introduces F.I.T.T principle from ACSM for exercise prescriptions.  Reviews F.I.T.T. principles of aerobic exercise including progression. Written material given at graduation.   Education: Resistance Exercise: - Group verbal and visual presentation on the components of exercise prescription. Introduces F.I.T.T principle from ACSM for exercise prescriptions  Reviews F.I.T.T. principles of resistance exercise including progression. Written material given at graduation. Flowsheet Row Pulmonary Rehab from 05/31/2021 in [facility] Cardiac and Pulmonary Rehab  Date 05/24/21  Educator KL  Instruction Review Code 1- Verbalizes Understanding        Education: Exercise & Equipment Safety: - Individual verbal instruction and demonstration of equipment use and safety with use of the  equipment. Flowsheet Row Pulmonary Rehab from 05/31/2021 in [facility] Cardiac and Pulmonary Rehab  Date 05/15/21  Educator AS  Instruction Review Code 1- Verbalizes Understanding       Education: Exercise Physiology & General Exercise Guidelines: - Group verbal and written instruction with models to review the exercise physiology of the cardiovascular system and associated critical values. Provides general exercise guidelines with specific guidelines to those with heart or lung disease.    Education: Flexibility, Balance, Mind/Body Relaxation: - Group verbal and visual presentation with interactive activity on the components of exercise prescription. Introduces F.I.T.T principle from ACSM for exercise prescriptions. Reviews F.I.T.T. principles of flexibility and balance exercise training including progression. Also discusses the mind body connection.  Reviews various relaxation techniques to help reduce and manage stress (i.e. Deep breathing, progressive muscle relaxation, and visualization). Balance handout provided  to take home. Written material given at graduation.   Activity Barriers & Risk Stratification:  Activity Barriers & Cardiac Risk Stratification - 05/05/21 1419       Activity Barriers & Cardiac Risk Stratification   Activity Barriers Muscular Weakness;Shortness of Breath   Leg weak            6 Minute Walk:  6 Minute Walk     Row Name 05/15/21 1637         6 Minute Walk   Distance 712 feet     Walk Time 4.5 minutes     # of Rest Breaks 2     MPH 1.8     METS 2.47     RPE 20     Perceived Dyspnea  4     VO2 Peak 8.64     Symptoms Yes (comment)     Comments SOB     Resting HR 96 bpm     Resting BP 106/60     Resting Oxygen Saturation  95 %     Exercise Oxygen Saturation  during 6 min walk 94 %     Max Ex. HR 114 bpm     Max Ex. BP 134/64     2 Minute Post BP 114/64           Interval HR     1 Minute HR 110     2 Minute HR 108     3 Minute HR 111     4  Minute HR 108     5 Minute HR 111     6 Minute HR 114     2 Minute Post HR 99     Interval Heart Rate? Yes           Interval Oxygen     Interval Oxygen? Yes     Baseline Oxygen Saturation % 95 %     1 Minute Oxygen Saturation % 95 %     2 Minute Oxygen Saturation % 93 %     3 Minute Oxygen Saturation % 95 %     4 Minute Oxygen Saturation % 94 %     5 Minute Oxygen Saturation % 96 %     6 Minute Oxygen Saturation % 94 %     2 Minute Post Oxygen Saturation % 96 %            Oxygen Initial Assessment:  Oxygen Initial Assessment - 05/05/21 1420       Home Oxygen   Home Oxygen Device None    Sleep Oxygen Prescription None    Home Exercise Oxygen Prescription None    Home Resting Oxygen Prescription None    Compliance with Home Oxygen Use Yes      Intervention   Short Term Goals To learn and understand importance of maintaining oxygen saturations>88%;To learn and demonstrate proper pursed lip breathing techniques or other breathing techniques. ;To learn and demonstrate proper use of respiratory medications    Long  Term Goals Exhibits compliance with exercise, home  and travel O2 prescription;Exhibits proper breathing techniques, such as pursed lip breathing or other method taught during program session;Compliance with respiratory medication;Demonstrates proper use of MDI's             Oxygen Re-Evaluation:  Oxygen Re-Evaluation     Row Name 05/22/21 1422             Home Oxygen   Home Oxygen Device None  Sleep Oxygen Prescription None       Home Exercise Oxygen Prescription None       Home Resting Oxygen Prescription None       Compliance with Home Oxygen Use Yes               Goals/Expected Outcomes     Short Term Goals To learn and understand importance of maintaining oxygen saturations>88%;To learn and demonstrate proper pursed lip breathing techniques or other breathing techniques.        Long  Term Goals Exhibits compliance with exercise, home  and  travel O2 prescription;Exhibits proper breathing techniques, such as pursed lip breathing or other method taught during program session;Compliance with respiratory medication       Comments Reviewed PLB technique with pt.  Talked about how it works and it's importance in maintaining their exercise saturations.       Goals/Expected Outcomes Short: Become more profiecient at using PLB.   Long: Become independent at using PLB.               Oxygen Discharge (Final Oxygen Re-Evaluation):  Oxygen Re-Evaluation - 05/22/21 1422       Home Oxygen   Home Oxygen Device None    Sleep Oxygen Prescription None    Home Exercise Oxygen Prescription None    Home Resting Oxygen Prescription None    Compliance with Home Oxygen Use Yes      Goals/Expected Outcomes   Short Term Goals To learn and understand importance of maintaining oxygen saturations>88%;To learn and demonstrate proper pursed lip breathing techniques or other breathing techniques.     Long  Term Goals Exhibits compliance with exercise, home  and travel O2 prescription;Exhibits proper breathing techniques, such as pursed lip breathing or other method taught during program session;Compliance with respiratory medication    Comments Reviewed PLB technique with pt.  Talked about how it works and it's importance in maintaining their exercise saturations.    Goals/Expected Outcomes Short: Become more profiecient at using PLB.   Long: Become independent at using PLB.             Initial Exercise Prescription:  Initial Exercise Prescription - 05/15/21 1600       Date of Initial Exercise RX and Referring Provider   Date 05/15/21    Referring Provider Orvan Falconer      Treadmill   MPH 1.5    Grade 0    Minutes 15    METs 2      Recumbant Bike   Level 1    RPM 60    Minutes 15    METs 2      NuStep   Level 1    SPM 80    Minutes 15    METs 2      Biostep-RELP   Level 1    SPM 50    Minutes 15    METs 2      Prescription  Details   Frequency (times per week) 3    Duration Progress to 30 minutes of continuous aerobic without signs/symptoms of physical distress      Intensity   THRR 40-80% of Max Heartrate 119-142    Ratings of Perceived Exertion 11-15    Perceived Dyspnea 0-4      Progression   Progression Continue to progress workloads to maintain intensity without signs/symptoms of physical distress.      Resistance Training   Training Prescription Yes    Weight 3 lb  Reps 10-15             Perform Capillary Blood Glucose checks as needed.  Exercise Prescription Changes:   Exercise Prescription Changes     Row Name 05/15/21 1600 05/23/21 0800 06/05/21 1500         Response to Exercise   Blood Pressure (Admit) 106/60 102/58 108/62     Blood Pressure (Exercise) 134/64 128/70 126/62     Blood Pressure (Exit) 114/64 95/58 100/64     Heart Rate (Admit) 96 bpm 100 bpm 82 bpm     Heart Rate (Exercise) 114 bpm 124 bpm 121 bpm     Heart Rate (Exit) 99 bpm 100 bpm 117 bpm     Oxygen Saturation (Admit) 95 % 96 % 98 %     Oxygen Saturation (Exercise) 93 % 93 % 94 %     Oxygen Saturation (Exit) 96 % 96 % 96 %     Rating of Perceived Exertion (Exercise) Perceived Dyspnea (Exercise) Symptoms SOB SOB SOB, fatigue     Comments -- first day --     Duration -- -- Progress to 30 minutes of  aerobic without signs/symptoms of physical distress     Intensity -- -- THRR unchanged           Progression       Progression -- Continue to progress workloads to maintain intensity without signs/symptoms of physical distress. Continue to progress workloads to maintain intensity without signs/symptoms of physical distress.     Average METs -- 3.4 3.56           Resistance Training       Training Prescription -- Yes Yes     Weight -- 3 lb 3 lb     Reps -- 10-15 10-15           Interval Training       Interval Training -- No No           Treadmill       MPH -- 1.5 2     Grade  -- 0 0.5     Minutes -- 15 15     METs -- 2.15 2.67           NuStep       Level -- -- 1     Minutes -- -- 15     METs -- -- 4.5           REL-XR       Level -- 1 1     Minutes -- 15 15     METs -- 4.7 3.5             Exercise Comments:   Exercise Comments     Row Name 05/22/21 1421           Exercise Comments First full day of exercise!  Patient was oriented to gym and equipment including functions, settings, policies, and procedures.  Patient's individual exercise prescription and treatment plan were reviewed.  All starting workloads were established based on the results of the 6 minute walk test done at initial orientation visit.  The plan for exercise progression was also introduced and progression will be customized based on patient's performance and goals.                Exercise Goals and Review:   Exercise Goals     Row Name  05/15/21 1642             Exercise Goals   Increase Physical Activity Yes       Intervention Provide advice, education, support and counseling about physical activity/exercise needs.;Develop an individualized exercise prescription for aerobic and resistive training based on initial evaluation findings, risk stratification, comorbidities and participant's personal goals.       Expected Outcomes Short Term: Attend rehab on a regular basis to increase amount of physical activity.;Long Term: Add in home exercise to make exercise part of routine and to increase amount of physical activity.;Long Term: Exercising regularly at least 3-5 days a week.       Increase Strength and Stamina Yes       Intervention Provide advice, education, support and counseling about physical activity/exercise needs.;Develop an individualized exercise prescription for aerobic and resistive training based on initial evaluation findings, risk stratification, comorbidities and participant's personal goals.       Expected Outcomes Short Term: Increase workloads from  initial exercise prescription for resistance, speed, and METs.;Long Term: Improve cardiorespiratory fitness, muscular endurance and strength as measured by increased METs and functional capacity ( )       Able to understand and use rate of perceived exertion (RPE) scale Yes       Intervention Provide education and explanation on how to use RPE scale       Expected Outcomes Short Term: Able to use RPE daily in rehab to express subjective intensity level;Long Term:  Able to use RPE to guide intensity level when exercising independently       Able to understand and use Dyspnea scale Yes       Intervention Provide education and explanation on how to use Dyspnea scale       Expected Outcomes Short Term: Able to use Dyspnea scale daily in rehab to express subjective sense of shortness of breath during exertion;Long Term: Able to use Dyspnea scale to guide intensity level when exercising independently       Knowledge and understanding of Target Heart Rate Range (THRR) Yes       Intervention Provide education and explanation of THRR including how the numbers were predicted and where they are located for reference       Expected Outcomes Short Term: Able to state/look up THRR;Short Term: Able to use daily as guideline for intensity in rehab;Long Term: Able to use THRR to govern intensity when exercising independently       Able to check pulse independently Yes       Intervention Provide education and demonstration on how to check pulse in carotid and radial arteries.;Review the importance of being able to check your own pulse for safety during independent exercise       Expected Outcomes Short Term: Able to explain why pulse checking is important during independent exercise;Long Term: Able to check pulse independently and accurately       Understanding of Exercise Prescription Yes       Intervention Provide education, explanation, and written materials on patient's individual exercise prescription        Expected Outcomes Short Term: Able to explain program exercise prescription;Long Term: Able to explain home exercise prescription to exercise independently                Exercise Goals Re-Evaluation :  Exercise Goals Re-Evaluation     Row Name 05/22/21 1421 06/05/21 1553           Exercise Goal Re-Evaluation   Exercise  Goals Review Increase Physical Activity;Able to understand and use rate of perceived exertion (RPE) scale;Knowledge and understanding of Target Heart Rate Range (THRR);Understanding of Exercise Prescription;Increase Strength and Stamina;Able to understand and use Dyspnea scale;Able to check pulse independently Increase Physical Activity;Increase Strength and Stamina;Understanding of Exercise Prescription      Comments Reviewed RPE and dyspnea scales, THR and program prescription with pt today.  Pt voiced understanding and was given a copy of goals to take home. Tracia has completed 6 full days of exercise.  She continues to have difficulty with leg weakness and is worried that she is over doing it when he legs begin to hurt and feel tired.  We have encouraged rest breaks and tried a variety of equipment options to help her ability to exericse.  We will conitnue to monitor her progress.      Expected Outcomes Short: Use RPE daily to regulate intensity. Long: Follow program prescription in THR. Short: Continue to attend rehab regularly Long: Continue to build stamina in legs               Discharge Exercise Prescription (Final Exercise Prescription Changes):  Exercise Prescription Changes - 06/05/21 1500       Response to Exercise   Blood Pressure (Admit) 108/62    Blood Pressure (Exercise) 126/62    Blood Pressure (Exit) 100/64    Heart Rate (Admit) 82 bpm    Heart Rate (Exercise) 121 bpm    Heart Rate (Exit) 117 bpm    Oxygen Saturation (Admit) 98 %    Oxygen Saturation (Exercise) 94 %    Oxygen Saturation (Exit) 96 %    Rating of Perceived Exertion (Exercise)  14    Perceived Dyspnea (Exercise) 2    Symptoms SOB, fatigue    Duration Progress to 30 minutes of  aerobic without signs/symptoms of physical distress    Intensity THRR unchanged      Progression   Progression Continue to progress workloads to maintain intensity without signs/symptoms of physical distress.    Average METs 3.56      Resistance Training   Training Prescription Yes    Weight 3 lb    Reps 10-15      Interval Training   Interval Training No      Treadmill   MPH 2    Grade 0.5    Minutes 15    METs 2.67      NuStep   Level 1    Minutes 15    METs 4.5      REL-XR   Level 1    Minutes 15    METs 3.5             Nutrition:  Target Goals: Understanding of nutrition guidelines, daily intake of sodium 1500mg , cholesterol 200mg , calories 30% from fat and 7% or less from saturated fats, daily to have 5 or more servings of fruits and vegetables.  Education: All About Nutrition: -Group instruction provided by verbal, written material, interactive activities, discussions, models, and posters to present general guidelines for heart healthy nutrition including fat, fiber, MyPlate, the role of sodium in heart healthy nutrition, utilization of the nutrition label, and utilization of this knowledge for meal planning. Follow up email sent as well. Written material given at graduation.   Biometrics:  Pre Biometrics - 05/15/21 1643       Pre Biometrics   Height  (1.549 m)    Weight 109 lb (49.4 kg)    BMI (Calculated)  20.61    Single Leg Stand 30 seconds              Nutrition Therapy Plan and Nutrition Goals:   Nutrition Assessments:  MEDIFICTS Score Key: ?70 Need to make dietary changes  40-70 Heart Healthy Diet ? 40 Therapeutic Level Cholesterol Diet  Flowsheet Row Pulmonary Rehab from 05/15/2021 in [facility] Cardiac and Pulmonary Rehab  Picture Your Plate Total Score on Admission 62      Picture Your Plate Scores: <84 Unhealthy dietary  pattern with much room for improvement. 41-50 Dietary pattern unlikely to meet recommendations for good health and room for improvement. 51-60 More healthful dietary pattern, with some room for improvement.  >60 Healthy dietary pattern, although there may be some specific behaviors that could be improved.   Nutrition Goals Re-Evaluation:   Nutrition Goals Discharge (Final Nutrition Goals Re-Evaluation):   Psychosocial: Target Goals: Acknowledge presence or absence of significant depression and/or stress, maximize coping skills, provide positive support system. Participant is able to verbalize types and ability to use techniques and skills needed for reducing stress and depression.   Education: Stress, Anxiety, and Depression - Group verbal and visual presentation to define topics covered.  Reviews how body is impacted by stress, anxiety, and depression.  Also discusses healthy ways to reduce stress and to treat/manage anxiety and depression.  Written material given at graduation.   Education: Sleep Hygiene -Provides group verbal and written instruction about how sleep can affect your health.  Define sleep hygiene, discuss sleep cycles and impact of sleep habits. Review good sleep hygiene tips.    Initial Review & Psychosocial Screening:  Initial Psych Review & Screening - 05/05/21 1422       Initial Review   Current issues with Current Stress Concerns    Source of Stress Concerns Chronic Illness    Comments Sometimes feelings of desperation about my illness.      Family Dynamics   Good Support System? Yes   2 sisters, 40 min and 1 hour away. Both come weekly to help.     Barriers   Psychosocial barriers to participate in program There are no identifiable barriers or psychosocial needs.      Screening Interventions   Interventions Encouraged to exercise    Expected Outcomes Short Term goal: Utilizing psychosocial counselor, staff and physician to assist with identification of  specific Stressors or current issues interfering with healing process. Setting desired goal for each stressor or current issue identified.;Long Term Goal: Stressors or current issues are controlled or eliminated.;Short Term goal: Identification and review with participant of any Quality of Life or Depression concerns found by scoring the questionnaire.;Long Term goal: The participant improves quality of Life and PHQ9 Scores as seen by post scores and/or verbalization of changes             Quality of Life Scores:  Scores of 19 and below usually indicate a poorer quality of life in these areas.  A difference of  2-3 points is a clinically meaningful difference.  A difference of 2-3 points in the total score of the Quality of Life Index has been associated with significant improvement in overall quality of life, self-image, physical symptoms, and general health in studies assessing change in quality of life.  PHQ-9: Recent Review Flowsheet Data     Depression screen [facility] 2/9 05/15/2021 05/03/2021 04/26/2021 03/28/2021 02/21/2021   Decreased Interest 3 0 0 0 0   Down, Depressed, Hopeless 2 0 0 0 0  PHQ - 2 Score 5 0 0 0 0   Altered sleeping 0 - - - -   Tired, decreased energy 3 - - - -   Change in appetite 2 - - - -   Feeling bad or failure about yourself  0 - - - -   Trouble concentrating 0 - - - -   Moving slowly or fidgety/restless 0 - - - -   Suicidal thoughts 0 - - - -   PHQ-9 Score 10 - - - -      Interpretation of Total Score  Total Score Depression Severity:  1-4 = Minimal depression, 5-9 = Mild depression, 10-14 = Moderate depression, 15-19 = Moderately severe depression, 20-27 = Severe depression   Psychosocial Evaluation and Intervention:  Psychosocial Evaluation - 05/05/21 1441       Psychosocial Evaluation & Interventions   Interventions Encouraged to exercise with the program and follow exercise prescription    Comments Edin has no barriers to attending the program. She  lives alone. She has 2 sisters that live an hour or less away that are at her home weekly or more to help her. She wants to attend and see if she can improve her shortness of breath symptoms and improove her activitiy levels.  She states she does not have depression, but does some time feel a desperation over her chronic illness. She is limited in her activities because of her shortness of breath. SHe does listen to music and watch some TV to keep herslf occupied.   She should do well in the program as she is ready to work on doing what she can to improve her activity levels. She is already using a treadmill at home for short sessions.    Expected Outcomes STG: Chrissa attends all scheduled sessions. She is able to progress with her exercise levels and gain staminaand decreased shortness of breath. LTG: Jalayiah continues with her progress to enable her to be more active longer periods of time.    Continue Psychosocial Services  Follow up required by staff             Psychosocial Re-Evaluation:   Psychosocial Discharge (Final Psychosocial Re-Evaluation):   Education: Education Goals: Education classes will be provided on a weekly basis, covering required topics. Participant will state understanding/return demonstration of topics presented.  Learning Barriers/Preferences:  Learning Barriers/Preferences - 05/05/21 1430       Learning Barriers/Preferences   Learning Barriers None    Learning Preferences None             General Pulmonary Education Topics:  Infection Prevention: - Provides verbal and written material to individual with discussion of infection control including proper hand washing and proper equipment cleaning during exercise session. Flowsheet Row Pulmonary Rehab from 05/31/2021 in [facility] Cardiac and Pulmonary Rehab  Date 05/15/21  Educator AS  Instruction Review Code 1- Verbalizes Understanding       Falls Prevention: - Provides verbal and written material to  individual with discussion of falls prevention and safety. Flowsheet Row Pulmonary Rehab from 05/31/2021 in [facility] Cardiac and Pulmonary Rehab  Date 05/15/21  Educator AS  Instruction Review Code 1- Verbalizes Understanding       Chronic Lung Disease Review: - Group verbal instruction with posters, models, PowerPoint presentations and videos,  to review new updates, new respiratory medications, new advancements in procedures and treatments. Providing information on websites and "800" numbers for continued self-education. Includes information about supplement oxygen, available portable oxygen systems,  continuous and intermittent flow rates, oxygen safety, concentrators, and Medicare reimbursement for oxygen. Explanation of Pulmonary Drugs, including class, frequency, complications, importance of spacers, rinsing mouth after steroid MDI's, and proper cleaning methods for nebulizers. Review of basic lung anatomy and physiology related to function, structure, and complications of lung disease. Review of risk factors. Discussion about methods for diagnosing sleep apnea and types of masks and machines for OSA. Includes a review of the use of types of environmental controls: home humidity, furnaces, filters, dust mite/pet prevention, HEPA vacuums. Discussion about weather changes, air quality and the benefits of nasal washing. Instruction on Warning signs, infection symptoms, calling MD promptly, preventive modes, and value of vaccinations. Review of effective airway clearance, coughing and/or vibration techniques. Emphasizing that all should Create an Action Plan. Written material given at graduation.   AED/CPR: - Group verbal and written instruction with the use of models to demonstrate the basic use of the AED with the basic ABC's of resuscitation.    Anatomy and Cardiac Procedures: - Group verbal and visual presentation and models provide information about basic cardiac anatomy and function. Reviews the  testing methods done to diagnose heart disease and the outcomes of the test results. Describes the treatment choices: Medical Management, Angioplasty, or Coronary Bypass Surgery for treating various heart conditions including Myocardial Infarction, Angina, Valve Disease, and Cardiac Arrhythmias.  Written material given at graduation. Flowsheet Row Pulmonary Rehab from 05/31/2021 in [facility] Cardiac and Pulmonary Rehab  Date 05/24/21  Educator [facility]  Instruction Review Code 1- Verbalizes Understanding       Medication Safety: - Group verbal and visual instruction to review commonly prescribed medications for heart and lung disease. Reviews the medication, class of the drug, and side effects. Includes the steps to properly store meds and maintain the prescription regimen.  Written material given at graduation.   Other: -Provides group and verbal instruction on various topics (see comments)   Knowledge Questionnaire Score:  Knowledge Questionnaire Score - 05/15/21 1647       Knowledge Questionnaire Score   Pre Score 4/18              Core Components/Risk Factors/Patient Goals at Admission:  Personal Goals and Risk Factors at Admission - 05/15/21 1644       Core Components/Risk Factors/Patient Goals on Admission    Weight Management Yes;Weight Gain    Intervention Weight Management: Develop a combined nutrition and exercise program designed to reach desired caloric intake, while maintaining appropriate intake of nutrient and fiber, sodium and fats, and appropriate energy expenditure required for the weight goal.;Weight Management: Provide education and appropriate resources to help participant work on and attain dietary goals.    Admit Weight 108 lb (49 kg)    Goal Weight: Short Term 115 lb (52.2 kg)    Goal Weight: Long Term 115 lb (52.2 kg)    Expected Outcomes Short Term: Continue to assess and modify interventions until short term weight is achieved;Long Term: Adherence to nutrition  and physical activity/exercise program aimed toward attainment of established weight goal;Weight Gain: Understanding of general recommendations for a high calorie, high protein meal plan that promotes weight gain by distributing calorie intake throughout the day with the consumption for 4-5 meals, snacks, and/or supplements    Intervention Provide education, individualized exercise plan and daily activity instruction to help decrease symptoms of SOB with activities of daily living.    Expected Outcomes Short Term: Improve cardiorespiratory fitness to achieve a reduction of symptoms when performing ADLs;Long Term:  Be able to perform more ADLs without symptoms or delay the onset of symptoms    Increase knowledge of respiratory medications and ability to use respiratory devices properly  Yes    Intervention Provide education and demonstration as needed of appropriate use of medications, inhalers, and oxygen therapy.    Expected Outcomes Short Term: Achieves understanding of medications use. Understands that oxygen is a medication prescribed by physician. Demonstrates appropriate use of inhaler and oxygen therapy.;Long Term: Maintain appropriate use of medications, inhalers, and oxygen therapy.             Education:Diabetes - Individual verbal and written instruction to review signs/symptoms of diabetes, desired ranges of glucose level fasting, after meals and with exercise. Acknowledge that pre and post exercise glucose checks will be done for 3 sessions at entry of program.   Know Your Numbers and Heart Failure: - Group verbal and visual instruction to discuss disease risk factors for cardiac and pulmonary disease and treatment options.  Reviews associated critical values for Overweight/Obesity, Hypertension, Cholesterol, and Diabetes.  Discusses basics of heart failure: signs/symptoms and treatments.  Introduces Heart Failure Zone chart for action plan for heart failure.  Written material given at  graduation.   Core Components/Risk Factors/Patient Goals Review:    Core Components/Risk Factors/Patient Goals at Discharge (Final Review):    ITP Comments:  ITP Comments     Row Name 05/05/21 1447 05/22/21 1421 06/07/21 0551       ITP Comments Virtual orientation call completed today. shehas an appointment on Date: 05/15/2021  for EP eval and gym Orientation.  [facility]  Date: 04/26/2021 and 03/28/2021 . First full day of exercise!  Patient was oriented to gym and equipment including functions, settings, policies, and procedures.  Patient's individual exercise prescription and treatment plan were reviewed.  All starting workloads were established based on the results of the 6 minute walk test done at initial orientation visit.  The plan for exercise progression was also introduced and progression will be customized based on patient's performance and goals. 30 Day review completed. Medical Director ITP review done, changes made as directed, and signed approval by Medical Director.     New to program              Comments:

## 2021-06-07 NOTE — Progress Notes (Signed)
Daily Session Note  Patient Details  Name: Victoria Mendoza MRN: [id number] Date of Birth: [birth year] Referring Provider:   Flowsheet Row Pulmonary Rehab from 05/15/2021 in [facility] Cardiac and Pulmonary Rehab  Referring Provider Megan Salon       Encounter Date: 06/07/2021  Check In:  Session Check In - 06/07/21 1356       Check-In   Supervising physician immediately available to respond to emergencies See telemetry face sheet for immediately available ER MD    Location [facility]-Cardiac & Pulmonary Rehab    Staff Present Birdie Sons, MPA, RN;Melissa [place], RDN, Tawanna Solo, MS, ASCM CEP, Exercise Physiologist;Shine Mikes, RN, BSN, CCRP    Virtual Visit No    Medication changes reported     No    Fall or balance concerns reported    No    Warm-up and Cool-down Performed on first and last piece of equipment    Resistance Training Performed Yes    VAD Patient? No    PAD/SET Patient? No      Pain Assessment   Currently in Pain? No/denies                Social History   Tobacco Use  Smoking Status Never  Smokeless Tobacco Never    Goals Met:  Proper associated with RPD/PD & O2 Sat Independence with exercise equipment Exercise tolerated well No report of cardiac concerns or symptoms  Goals Unmet:  Not Applicable  Comments: Pt able to follow exercise prescription today without complaint.  Will continue to monitor for progression.    Dr. Emily Filbert is Medical Director for [place].  Dr. Ottie Glazier is Medical Director for [facility] Pulmonary Rehabilitation.

## 2021-06-08 ENCOUNTER — Encounter: Payer: Medicare HMO | Admitting: *Deleted

## 2021-06-08 ENCOUNTER — Encounter: Payer: Self-pay | Admitting: Internal Medicine

## 2021-06-08 ENCOUNTER — Other Ambulatory Visit: Payer: Self-pay

## 2021-06-08 DIAGNOSIS — J432 Centrilobular emphysema: Secondary | ICD-10-CM

## 2021-06-08 NOTE — Progress Notes (Signed)
Daily Session Note  Patient Details  Name: Victoria Mendoza MRN: [id number] Date of Birth: 1956-05-15 Referring Provider:   Flowsheet Row Pulmonary Rehab from 05/15/2021 in [facility] Cardiac and Pulmonary Rehab  Referring Provider Megan Salon       Encounter Date: 06/08/2021  Check In:  Session Check In - 06/08/21 1339       Check-In   Supervising physician immediately available to respond to emergencies See telemetry face sheet for immediately available ER MD    Location [facility]-Cardiac & Pulmonary Rehab    Staff Present Renita Papa, RN BSN;Joseph Tessie Fass, RCP,RRT,BSRT;Melissa [place], [place], LDN    Virtual Visit No    Medication changes reported     No    Fall or balance concerns reported    No    Warm-up and Cool-down Performed on first and last piece of equipment    Resistance Training Performed Yes    VAD Patient? No    PAD/SET Patient? No      Pain Assessment   Currently in Pain? No/denies                Social History   Tobacco Use  Smoking Status Never  Smokeless Tobacco Never    Goals Met:  Independence with exercise equipment Exercise tolerated well No report of cardiac concerns or symptoms Strength training completed today  Goals Unmet:  Not Applicable  Comments: Pt able to follow exercise prescription today without complaint.  Will continue to monitor for progression.    Dr. Emily Filbert is Medical Director for [place].  Dr. Ottie Glazier is Medical Director for [facility] Pulmonary Rehabilitation.

## 2021-06-12 ENCOUNTER — Encounter: Payer: Self-pay | Admitting: Internal Medicine

## 2021-06-12 ENCOUNTER — Other Ambulatory Visit: Payer: Self-pay

## 2021-06-12 DIAGNOSIS — A318 Other mycobacterial infections: Secondary | ICD-10-CM

## 2021-06-12 DIAGNOSIS — J432 Centrilobular emphysema: Secondary | ICD-10-CM | POA: Diagnosis not present

## 2021-06-12 DIAGNOSIS — A319 Mycobacterial infection, unspecified: Secondary | ICD-10-CM

## 2021-06-12 NOTE — Progress Notes (Signed)
Daily Session Note  Patient Details  Name: Victoria Mendoza MRN: [id number] Date of Birth: 08-31-1956 Referring Provider:   Flowsheet Row Pulmonary Rehab from 05/15/2021 in [facility] Cardiac and Pulmonary Rehab  Referring Provider Megan Salon       Encounter Date: 06/12/2021  Check In:  Session Check In - 06/12/21 1330       Check-In   Supervising physician immediately available to respond to emergencies See telemetry face sheet for immediately available ER MD    Location [facility]-Cardiac & Pulmonary Rehab    Staff Present Birdie Sons, MPA, RN;Joseph Lou [place], MS, ASCM CEP, Exercise Physiologist    Virtual Visit No    Medication changes reported     No    Fall or balance concerns reported    No    Warm-up and Cool-down Performed on first and last piece of equipment    Resistance Training Performed Yes    VAD Patient? No    PAD/SET Patient? No      Pain Assessment   Currently in Pain? No/denies                Social History   Tobacco Use  Smoking Status Never  Smokeless Tobacco Never    Goals Met:  Independence with exercise equipment Exercise tolerated well No report of cardiac concerns or symptoms Strength training completed today  Goals Unmet:  Not Applicable  Comments: Pt able to follow exercise prescription today without complaint.  Will continue to monitor for progression.    Dr. Emily Filbert is Medical Director for [place].  Dr. Ottie Glazier is Medical Director for [facility] Pulmonary Rehabilitation.

## 2021-06-13 ENCOUNTER — Other Ambulatory Visit: Payer: Medicare HMO

## 2021-06-13 ENCOUNTER — Other Ambulatory Visit: Payer: Self-pay

## 2021-06-13 DIAGNOSIS — J432 Centrilobular emphysema: Secondary | ICD-10-CM

## 2021-06-13 DIAGNOSIS — A310 Pulmonary mycobacterial infection: Secondary | ICD-10-CM

## 2021-06-13 NOTE — Progress Notes (Signed)
Completed initial RD consultation ?

## 2021-06-14 ENCOUNTER — Other Ambulatory Visit: Payer: Self-pay

## 2021-06-14 DIAGNOSIS — A318 Other mycobacterial infections: Secondary | ICD-10-CM

## 2021-06-14 DIAGNOSIS — J432 Centrilobular emphysema: Secondary | ICD-10-CM | POA: Diagnosis not present

## 2021-06-14 DIAGNOSIS — A319 Mycobacterial infection, unspecified: Secondary | ICD-10-CM

## 2021-06-14 NOTE — Progress Notes (Signed)
Daily Session Note  Patient Details  Name: Victoria Mendoza MRN: [id number] Date of Birth: 01/12/1956 Referring Provider:   Flowsheet Row Pulmonary Rehab from 05/15/2021 in [facility] Cardiac and Pulmonary Rehab  Referring Provider Megan Salon       Encounter Date: 06/14/2021  Check In:  Session Check In - 06/14/21 1325       Check-In   Supervising physician immediately available to respond to emergencies See telemetry face sheet for immediately available ER MD    Location [facility]-Cardiac & Pulmonary Rehab    Staff Present Birdie Sons, MPA, Nino Glow, MS, ASCM CEP, Exercise Physiologist;Joseph Tessie Fass, [place]    Virtual Visit No    Medication changes reported     No    Fall or balance concerns reported    No    Warm-up and Cool-down Performed on first and last piece of equipment    Resistance Training Performed Yes    VAD Patient? No    PAD/SET Patient? No      Pain Assessment   Currently in Pain? No/denies                Social History   Tobacco Use  Smoking Status Never  Smokeless Tobacco Never    Goals Met:  Independence with exercise equipment Exercise tolerated well No report of cardiac concerns or symptoms Strength training completed today  Goals Unmet:  Not Applicable  Comments: Pt able to follow exercise prescription today without complaint.  Will continue to monitor for progression.    Dr. Emily Filbert is Medical Director for [place].  Dr. Ottie Glazier is Medical Director for [facility] Pulmonary Rehabilitation.

## 2021-06-15 ENCOUNTER — Encounter: Payer: Medicare HMO | Admitting: *Deleted

## 2021-06-15 ENCOUNTER — Other Ambulatory Visit: Payer: Self-pay

## 2021-06-15 DIAGNOSIS — J432 Centrilobular emphysema: Secondary | ICD-10-CM

## 2021-06-15 NOTE — Progress Notes (Signed)
Daily Session Note  Patient Details  Name: Victoria Mendoza MRN: [id number] Date of Birth: 02/04/1956 Referring Provider:   Flowsheet Row Pulmonary Rehab from 05/15/2021 in [facility] Cardiac and Pulmonary Rehab  Referring Provider Megan Salon       Encounter Date: 06/15/2021  Check In:  Session Check In - 06/15/21 1332       Check-In   Supervising physician immediately available to respond to emergencies See telemetry face sheet for immediately available ER MD    Location [facility]-Cardiac & Pulmonary Rehab    Staff Present Renita Papa, RN BSN;Joseph [place], RCP,RRT,BSRT;Jessica [place], [place], RCEP, CCRP, CCET    Virtual Visit No    Medication changes reported     No    Fall or balance concerns reported    No    Warm-up and Cool-down Performed on first and last piece of equipment    Resistance Training Performed Yes    VAD Patient? No    PAD/SET Patient? No      Pain Assessment   Currently in Pain? No/denies                Social History   Tobacco Use  Smoking Status Never  Smokeless Tobacco Never    Goals Met:  Independence with exercise equipment Exercise tolerated well No report of cardiac concerns or symptoms Strength training completed today  Goals Unmet:  Not Applicable  Comments: Pt able to follow exercise prescription today without complaint.  Will continue to monitor for progression.    Dr. Emily Filbert is Medical Director for [place].  Dr. Ottie Glazier is Medical Director for [facility] Pulmonary Rehabilitation.

## 2021-06-19 ENCOUNTER — Telehealth: Payer: Self-pay

## 2021-06-19 ENCOUNTER — Other Ambulatory Visit: Payer: Self-pay

## 2021-06-19 DIAGNOSIS — J432 Centrilobular emphysema: Secondary | ICD-10-CM | POA: Diagnosis not present

## 2021-06-19 DIAGNOSIS — A319 Mycobacterial infection, unspecified: Secondary | ICD-10-CM

## 2021-06-19 DIAGNOSIS — A318 Other mycobacterial infections: Secondary | ICD-10-CM

## 2021-06-19 NOTE — Progress Notes (Signed)
Daily Session Note  Patient Details  Name: Victoria Mendoza MRN: [id number] Date of Birth: 15-Jun-1956 Referring Provider:   Flowsheet Row Pulmonary Rehab from 05/15/2021 in [facility] Cardiac and Pulmonary Rehab  Referring Provider Megan Salon       Encounter Date: 06/19/2021  Check In:  Session Check In - 06/19/21 1327       Check-In   Supervising physician immediately available to respond to emergencies See telemetry face sheet for immediately available ER MD    Location [facility]-Cardiac & Pulmonary Rehab    Staff Present Birdie Sons, MPA, RN;Joseph [place], Jaci Carrel, BS, ACSM CEP, Exercise Physiologist;Amanda Oletta Darter, [place], ACSM CEP, Exercise Physiologist    Virtual Visit No    Medication changes reported     No    Fall or balance concerns reported    No    Warm-up and Cool-down Performed on first and last piece of equipment    Resistance Training Performed Yes    VAD Patient? No    PAD/SET Patient? No      Pain Assessment   Currently in Pain? No/denies                Social History   Tobacco Use  Smoking Status Never  Smokeless Tobacco Never    Goals Met:  Independence with exercise equipment Exercise tolerated well Personal goals reviewed No report of cardiac concerns or symptoms Strength training completed today  Goals Unmet:  Not Applicable  Comments: Pt able to follow exercise prescription today without complaint.  Will continue to monitor for progression.    Dr. Emily Filbert is Medical Director for [place].  Dr. Ottie Glazier is Medical Director for [facility] Pulmonary Rehabilitation.

## 2021-06-19 NOTE — Telephone Encounter (Signed)
[facility] calling to get verbal orders for PICC Line Maintenance orders. After review of chart and seeing that end dat eis due 7/28 I did give verbal order.

## 2021-06-19 NOTE — Telephone Encounter (Signed)
Received call from Verlon Au, nurse with [facility] home [facility], she states they infused alteplase for over an hour and they are still unable to get any blood return from the PICC. She says it flushes sluggishly, but that IV antibiotics are still able to infuse.   Patient's tentative end date is 7/28, at which time she has a phone visit scheduled with Dr. Orvan Falconer. Verlon Au states that patient's Francine Graven will only cover nurse visits through 7/30. Will route to provider.   Sandie Ano, RN

## 2021-06-21 ENCOUNTER — Other Ambulatory Visit: Payer: Self-pay

## 2021-06-21 DIAGNOSIS — A319 Mycobacterial infection, unspecified: Secondary | ICD-10-CM

## 2021-06-21 DIAGNOSIS — J432 Centrilobular emphysema: Secondary | ICD-10-CM

## 2021-06-21 DIAGNOSIS — A318 Other mycobacterial infections: Secondary | ICD-10-CM

## 2021-06-21 NOTE — Progress Notes (Signed)
Daily Session Note  Patient Details  Name: Khai Torbert MRN: [id number] Date of Birth: 02/04/1956 Referring Provider:   Flowsheet Row Pulmonary Rehab from 05/15/2021 in [facility] Cardiac and Pulmonary Rehab  Referring Provider Megan Salon       Encounter Date: 06/21/2021  Check In:  Session Check In - 06/21/21 1330       Check-In   Supervising physician immediately available to respond to emergencies See telemetry face sheet for immediately available ER MD    Location [facility]-Cardiac & Pulmonary Rehab    Staff Present Birdie Sons, MPA, Nino Glow, MS, ASCM CEP, Exercise Physiologist;Joseph Tessie Fass, [place]    Virtual Visit No    Medication changes reported     No    Fall or balance concerns reported    No    Warm-up and Cool-down Performed on first and last piece of equipment    Resistance Training Performed Yes    VAD Patient? No    PAD/SET Patient? No      Pain Assessment   Currently in Pain? No/denies                Social History   Tobacco Use  Smoking Status Never  Smokeless Tobacco Never    Goals Met:  Independence with exercise equipment Exercise tolerated well No report of cardiac concerns or symptoms Strength training completed today  Goals Unmet:  Not Applicable  Comments: Pt able to follow exercise prescription today without complaint.  Will continue to monitor for progression.    Dr. Emily Filbert is Medical Director for [place].  Dr. Ottie Glazier is Medical Director for [facility] Pulmonary Rehabilitation.

## 2021-06-22 ENCOUNTER — Encounter: Payer: Medicare HMO | Admitting: *Deleted

## 2021-06-22 ENCOUNTER — Telehealth: Payer: Self-pay

## 2021-06-22 ENCOUNTER — Telehealth (INDEPENDENT_AMBULATORY_CARE_PROVIDER_SITE_OTHER): Payer: Medicare HMO | Admitting: Internal Medicine

## 2021-06-22 ENCOUNTER — Encounter: Payer: Self-pay | Admitting: Internal Medicine

## 2021-06-22 ENCOUNTER — Other Ambulatory Visit: Payer: Self-pay

## 2021-06-22 DIAGNOSIS — J432 Centrilobular emphysema: Secondary | ICD-10-CM | POA: Diagnosis not present

## 2021-06-22 DIAGNOSIS — A319 Mycobacterial infection, unspecified: Secondary | ICD-10-CM | POA: Diagnosis not present

## 2021-06-22 DIAGNOSIS — A318 Other mycobacterial infections: Secondary | ICD-10-CM

## 2021-06-22 NOTE — Progress Notes (Signed)
Daily Session Note  Patient Details  Name: Victoria Mendoza MRN: [id number] Date of Birth: [birth year] Referring Provider:   Flowsheet Row Pulmonary Rehab from 05/15/2021 in [facility] Cardiac and Pulmonary Rehab  Referring Provider Megan Salon       Encounter Date: 06/22/2021  Check In:  Session Check In - 06/22/21 1346       Check-In   Supervising physician immediately available to respond to emergencies See telemetry face sheet for immediately available ER MD    Location [facility]-Cardiac & Pulmonary Rehab    Staff Present Renita Papa, RN BSN;Joseph Tessie Fass, RCP,RRT,BSRT;Melissa [place], [place], LDN    Virtual Visit No    Medication changes reported     No    Fall or balance concerns reported    No    Warm-up and Cool-down Performed on first and last piece of equipment    Resistance Training Performed Yes    VAD Patient? No    PAD/SET Patient? No      Pain Assessment   Currently in Pain? No/denies                Social History   Tobacco Use  Smoking Status Never  Smokeless Tobacco Never    Goals Met:  Independence with exercise equipment Exercise tolerated well No report of cardiac concerns or symptoms Strength training completed today  Goals Unmet:  Not Applicable  Comments: Pt able to follow exercise prescription today without complaint.  Will continue to monitor for progression.    Dr. Emily Filbert is Medical Director for [place].  Dr. Ottie Glazier is Medical Director for [facility] Pulmonary Rehabilitation.

## 2021-06-22 NOTE — Telephone Encounter (Signed)
RN awaiting call back from clinical manager at [facility] home health to determine if patient will be able to continue with nurse visits. Previously, [facility] claimed that home health visits would expire 06/24/21.   Per Dr. Orvan Falconer, if nursing is no longer able to visit patient then IV antibiotic end date should be 06/24/21 when home health visits expire. PICC should be maintained by patient at least through 07/10/21 when she has appointment with [facility]. Patient will need to be set up with weekly dressing changes and labs at the clinic.   If nursing is able to continue home health visits, Dr. Orvan Falconer would like to extend IV antibiotics through his next follow up with her on 07/12/21.   Will need to give orders to [place] pharmacist (P: [phone])   [facility] P: [phone]  Sandie Ano, RN

## 2021-06-22 NOTE — Progress Notes (Signed)
Virtual Visit via Telephone Note  I connected with Victoria Mendoza on 06/22/21 at 10:15 AM EDT by telephone and verified that I am speaking with the correct person using two identifiers.  Location: Patient: Home Provider: [facility]   I discussed the limitations, risks, security and privacy concerns of performing an evaluation and management service by telephone and the availability of in person appointments. I also discussed with the patient that there may be a patient responsible charge related to this service. The patient expressed understanding and agreed to proceed.   History of Present Illness: I called and spoke with Victoria Mendoza today.  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  She has been on oral azithromycin, ethambutol and rifampin since May 2021.  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  She felt like she could not tolerate imipenem.  She was hospitalized briefly and discharged on IV tigecycline, amikacin and cefepime on 02/21/2021.  She has not had any obvious complications related to her antibiotics or PICC.  She started pulmonary rehab about 5 weeks ago.  She says that her oxygen saturation levels have been better when she is doing physical therapy but overall she says she is feeling much worse.  She says that she is coughing more and bringing up more dark sputum.  She has not had any hemoptysis.  She says she feels more short of breath and extremely fatigued.  Sputum AFB smear and culture obtained on 03/28/2021 were negative.  Repeat sputum smear on 06/13/2021 was negative but cultures are growing AFB again.  She is scheduled to follow-up with Dr. Valentina Lucks at [facility] on 07/10/2021.  She has not scheduled a follow-up visit yet with her thoracic surgeon.   Observations/Objective: Sputum 03/28/2021: AFB smear and culture negative Sputum 06/13/2021: AFB smear negative; AFB culture positive Creatinine 06/19/2021:  0.68  Assessment and Plan: Victoria Mendoza continues to struggle.  It is unclear to me how much benefit she has gotten from her current, complicated antibiotic regimen.  We received a call from her home health agency recently stating that her home health benefits may run out on 06/24/2021.  If so we will discontinue her IV antibiotics.  If we can get her benefits extended we will continue her IV antibiotics at least through her follow-up visit with Dr. Valentina Lucks on 07/10/2021.  I asked her to go ahead and make a follow-up visit with Dr. Ewing Schlein, her thoracic surgeon at [facility] for consideration of partial lung resection.  Follow Up Instructions: Continue current antibiotics Follow-up with Dr. Valentina Lucks on 07/10/2021 Follow-up here on 07/11/2021   I discussed the assessment and treatment plan with the patient. The patient was provided an opportunity to ask questions and all were answered. The patient agreed with the plan and demonstrated an understanding of the instructions.   The patient was advised to call back or seek an in-person evaluation if the symptoms worsen or if the condition fails to improve as anticipated.  I provided 18 minutes of non-face-to-face time during this encounter.   Cliffton Asters, MD

## 2021-06-22 NOTE — Telephone Encounter (Signed)
Received call from [facility] to notify office regarding mycobacterium culture results. Provider aware.   Sandie Ano, RN

## 2021-06-22 NOTE — Telephone Encounter (Signed)
RN spoke with Victoria Mendoza at [facility], patient's home health visits have been renewed through 07/26/21. RN gave verbal orders per Dr. Orvan Falconer to extend IV antibiotics through at least 07/12/21. Orders repeated and verified.   RN gave verbal orders to Jonny Ruiz, pharmacist at [facility], to continue with IV antibiotics through at least 07/12/21. Orders repeated and verified.   Sandie Ano, RN

## 2021-06-24 ENCOUNTER — Other Ambulatory Visit: Payer: Self-pay | Admitting: Internal Medicine

## 2021-06-26 ENCOUNTER — Other Ambulatory Visit: Payer: Self-pay

## 2021-06-26 ENCOUNTER — Encounter: Payer: Medicare HMO | Attending: Internal Medicine

## 2021-06-26 DIAGNOSIS — A319 Mycobacterial infection, unspecified: Secondary | ICD-10-CM | POA: Insufficient documentation

## 2021-06-26 DIAGNOSIS — J432 Centrilobular emphysema: Secondary | ICD-10-CM

## 2021-06-26 DIAGNOSIS — A318 Other mycobacterial infections: Secondary | ICD-10-CM

## 2021-06-26 NOTE — Progress Notes (Signed)
Daily Session Note  Patient Details  Name: Catelyn Friel MRN: [id number] Date of Birth: [birth year] Referring Provider:   Flowsheet Row Pulmonary Rehab from 05/15/2021 in [facility] Cardiac and Pulmonary Rehab  Referring Provider Megan Salon       Encounter Date: 06/26/2021  Check In:  Session Check In - 06/26/21 1333       Check-In   Supervising physician immediately available to respond to emergencies See telemetry face sheet for immediately available ER MD    Location [facility]-Cardiac & Pulmonary Rehab    Staff Present Birdie Sons, MPA, Nino Glow, MS, ASCM CEP, Exercise Physiologist;Meredith Sherryll Burger, RN BSN    Virtual Visit No    Medication changes reported     No    Fall or balance concerns reported    No    Warm-up and Cool-down Performed on first and last piece of equipment    Resistance Training Performed Yes    VAD Patient? No    PAD/SET Patient? No      Pain Assessment   Currently in Pain? No/denies                Social History   Tobacco Use  Smoking Status Never  Smokeless Tobacco Never    Goals Met:  Independence with exercise equipment Exercise tolerated well No report of cardiac concerns or symptoms Strength training completed today  Goals Unmet:  Not Applicable  Comments: Pt able to follow exercise prescription today without complaint.  Will continue to monitor for progression.    Dr. Emily Filbert is Medical Director for [facility].  Dr. Ottie Glazier is Medical Director for [facility] Pulmonary Rehabilitation.

## 2021-06-28 ENCOUNTER — Encounter: Payer: Medicare HMO | Admitting: *Deleted

## 2021-06-28 ENCOUNTER — Other Ambulatory Visit: Payer: Self-pay

## 2021-06-28 DIAGNOSIS — J432 Centrilobular emphysema: Secondary | ICD-10-CM | POA: Diagnosis not present

## 2021-06-28 NOTE — Progress Notes (Signed)
Daily Session Note  Patient Details  Name: Victoria Mendoza MRN: [id number] Date of Birth: 1956/01/06 Referring Provider:   Flowsheet Row Pulmonary Rehab from 05/15/2021 in [facility] Cardiac and Pulmonary Rehab  Referring Provider Megan Salon       Encounter Date: 06/28/2021  Check In:  Session Check In - 06/28/21 1554       Check-In   Supervising physician immediately available to respond to emergencies See telemetry face sheet for immediately available ER MD    Location [facility]-Cardiac & Pulmonary Rehab    Staff Present Will Bonnet, RN,BC,MSN;Melissa [place]oved[place], RDN, Tawanna Solo, MS, ASCM CEP, Exercise Physiologist    Virtual Visit No    Medication changes reported     No    Fall or balance concerns reported    No    Warm-up and Cool-down Performed on first and last piece of equipment    Resistance Training Performed Yes    VAD Patient? No    PAD/SET Patient? No      Pain Assessment   Currently in Pain? No/denies                Social History   Tobacco Use  Smoking Status Never  Smokeless Tobacco Never    Goals Met:  Independence with exercise equipment Exercise tolerated well No report of cardiac concerns or symptoms Strength training completed today  Goals Unmet:  Not Applicable  Comments: Pt able to follow exercise prescription today without complaint.  Will continue to monitor for progression.    Dr. Emily Filbert is Medical Director for [place].  Dr. Ottie Glazier is Medical Director for [facility] Pulmonary Rehabilitation.

## 2021-06-29 ENCOUNTER — Encounter: Payer: Medicare HMO | Admitting: *Deleted

## 2021-06-29 ENCOUNTER — Other Ambulatory Visit: Payer: Self-pay

## 2021-06-29 ENCOUNTER — Telehealth: Payer: Self-pay

## 2021-06-29 DIAGNOSIS — J432 Centrilobular emphysema: Secondary | ICD-10-CM

## 2021-06-29 NOTE — Telephone Encounter (Signed)
I spoke with Victoria Mendoza and she stated the thrombolytic protocol has been done and has not seemed to help. She states that she feels the picc line need to be replaced.  [name] T [name]

## 2021-06-29 NOTE — Progress Notes (Signed)
Daily Session Note  Patient Details  Name: Victoria Mendoza MRN: [id number] Date of Birth: 04/19/1956 Referring Provider:   Flowsheet Row Pulmonary Rehab from 05/15/2021 in [facility] Cardiac and Pulmonary Rehab  Referring Provider Megan Salon       Encounter Date: 06/29/2021  Check In:  Session Check In - 06/29/21 1332       Check-In   Supervising physician immediately available to respond to emergencies See telemetry face sheet for immediately available ER MD    Location [facility]-Cardiac & Pulmonary Rehab    Staff Present Renita Papa, RN BSN;Joseph [place], RCP,RRT,BSRT;Jessica [place], [place], RCEP, CCRP, CCET    Virtual Visit No    Medication changes reported     No    Fall or balance concerns reported    No    Warm-up and Cool-down Performed on first and last piece of equipment    Resistance Training Performed Yes    VAD Patient? No    PAD/SET Patient? No      Pain Assessment   Currently in Pain? No/denies                Social History   Tobacco Use  Smoking Status Never  Smokeless Tobacco Never    Goals Met:  Independence with exercise equipment Exercise tolerated well No report of cardiac concerns or symptoms Strength training completed today  Goals Unmet:  Not Applicable  Comments: Pt able to follow exercise prescription today without complaint.  Will continue to monitor for progression.    Dr. Emily Filbert is Medical Director for [place].  Dr. Ottie Glazier is Medical Director for [facility] Pulmonary Rehabilitation.

## 2021-06-29 NOTE — Telephone Encounter (Signed)
I attempted to contact the patient to ask her if she is willing to have the picc line replaced. Patient did not answer and a voicemail was left for her to call back. Victoria Mendoza T [name]

## 2021-06-29 NOTE — Telephone Encounter (Signed)
Spoke with Jewel Baize, NP with [facility]. She stated they are work on getting the patient approved for and IV infusion pump to help give more pressure with patient administering the medication. Patient does not wish to have picc line replaced at this time. She would like to see if Dr. Valentina Lucks is going to continue IV antibiotics.  Jewel Baize can be reached at [phone]. Jewel Baize or a pharmacist from [facility] will be calling back to give an update tomorrow.

## 2021-06-29 NOTE — Telephone Encounter (Signed)
Verlon Au with [facility] called stating the patient's picc line is becoming more difficult to flush. She states yesterday was extremely difficult. Verlon Au would like to know what do regarding the patient's picc line. Verlon Au can be reached at [phone] Please advise.  Teresita Fanton T [name]

## 2021-07-03 ENCOUNTER — Other Ambulatory Visit: Payer: Self-pay

## 2021-07-03 DIAGNOSIS — J432 Centrilobular emphysema: Secondary | ICD-10-CM | POA: Diagnosis not present

## 2021-07-03 DIAGNOSIS — A318 Other mycobacterial infections: Secondary | ICD-10-CM

## 2021-07-03 DIAGNOSIS — A319 Mycobacterial infection, unspecified: Secondary | ICD-10-CM

## 2021-07-03 DIAGNOSIS — A310 Pulmonary mycobacterial infection: Secondary | ICD-10-CM

## 2021-07-03 NOTE — Telephone Encounter (Signed)
Barbara with [facility] IR returned phone call. She stated that we should try the vascular department to see if they can see the patient sooner because IR schedule is completely booked. I will try the vascular department at [facility] tomorrow.   Tenna Lacko T [name]

## 2021-07-03 NOTE — Telephone Encounter (Signed)
I have spoke with Victoria Mendoza with IR and patient is scheduled for Thursday 07/06/21 for picc line replacement. Patient aware of this appointment and requesting to see if IR can see her sooner. I have also called [facility] to see if they had a sooner appointment, which they did not have.   Awaiting on a call back from [facility] IR to see if they can see the patient sooner. Patient also is willing to drive to [facility] if they they are able to see her sooner. Victoria Mendoza T Victoria Mendoza

## 2021-07-03 NOTE — Progress Notes (Signed)
Daily Session Note  Patient Details  Name: Jayden Rudge MRN: [id number] Date of Birth: 1956/08/30 Referring Provider:   Flowsheet Row Pulmonary Rehab from 05/15/2021 in [facility] Cardiac and Pulmonary Rehab  Referring Provider Megan Salon       Encounter Date: 07/03/2021  Check In:  Session Check In - 07/03/21 1336       Check-In   Supervising physician immediately available to respond to emergencies See telemetry face sheet for immediately available ER MD    Location [facility]-Cardiac & Pulmonary Rehab    Staff Present Birdie Sons, MPA, Elveria Rising, BA, ACSM CEP, Exercise Physiologist;Laureen Owens Shark, BS, RRT, CPFT    Virtual Visit No    Medication changes reported     No    Fall or balance concerns reported    No    Warm-up and Cool-down Performed on first and last piece of equipment    Resistance Training Performed Yes    VAD Patient? No    PAD/SET Patient? No      Pain Assessment   Currently in Pain? No/denies                Social History   Tobacco Use  Smoking Status Never  Smokeless Tobacco Never    Goals Met:  Independence with exercise equipment Exercise tolerated well No report of cardiac concerns or symptoms Strength training completed today  Goals Unmet:  Not Applicable  Comments: Pt able to follow exercise prescription today without complaint.  Will continue to monitor for progression.    Dr. Emily Filbert is Medical Director for [place].  Dr. Ottie Glazier is Medical Director for [facility] Pulmonary Rehabilitation.

## 2021-07-04 NOTE — Telephone Encounter (Signed)
Patient will keep the 07/06/21 appointment. Vascular unable to see patient sooner

## 2021-07-04 NOTE — Telephone Encounter (Signed)
I have reached out to vascular to see if they are able to schedule patient for picc line replacement. I was unable to speak to the scheduling nurse and I left a voicemail for her to call back.   I have advised the patient for now to keep her Thursday appointment at [facility] for her picc replacement unless I call her otherwise for a sooner appointment. Patient verbalized understanding.  Rajanee Schuelke T [name]

## 2021-07-05 ENCOUNTER — Encounter: Payer: Self-pay | Admitting: *Deleted

## 2021-07-05 ENCOUNTER — Other Ambulatory Visit: Payer: Self-pay

## 2021-07-05 DIAGNOSIS — J432 Centrilobular emphysema: Secondary | ICD-10-CM

## 2021-07-05 DIAGNOSIS — A318 Other mycobacterial infections: Secondary | ICD-10-CM

## 2021-07-05 DIAGNOSIS — A319 Mycobacterial infection, unspecified: Secondary | ICD-10-CM

## 2021-07-05 NOTE — Progress Notes (Signed)
Pulmonary Individual Treatment Plan  Patient Details  Name: Victoria Mendoza MRN: [id number] Date of Birth: 1956-03-11 Referring Provider:   Flowsheet Row Pulmonary Rehab from 05/15/2021 in [facility] Cardiac and Pulmonary Rehab  Referring Provider Orvan Falconer       Initial Encounter Date:  Flowsheet Row Pulmonary Rehab from 05/15/2021 in [facility] Cardiac and Pulmonary Rehab  Date 05/15/21       Visit Diagnosis: Centrilobular emphysema (HCC)  Mycobacterium abscessus infection  Patient's Home Medications on Admission:  Current Outpatient Medications:    albuterol (PROVENTIL) (2.5 MG/3ML) 0.083% nebulizer solution, Take 3 mLs (2.5 mg total) by nebulization every four hours as needed for wheezing or shortness of breath., Disp: 90 mL, Rfl: 12   albuterol (VENTOLIN HFA) 108 (90 Base) MCG/ACT inhaler, INHALE 2 PUFFS INTO THE LUNGS EVERY 6 HOURS AS NEEDED FOR WHEEZING OR SHORTNESS OF BREATH, Disp: 8.5 g, Rfl: 4   azithromycin (ZITHROMAX) 250 MG tablet, Take 1 tablet (250 mg total) by mouth daily., Disp: 30 tablet, Rfl: 5   azithromycin (ZITHROMAX) 500 MG tablet, Take 500 mg by mouth daily., Disp: , Rfl:    B Complex Vitamins (VITAMIN B COMPLEX) TABS, Take 1 tablet by mouth daily., Disp: , Rfl:    hydrOXYzine (ATARAX/VISTARIL) 25 MG tablet, Take 1 tablet (25 mg total) by mouth as needed (one time dose for increased anxiety)., Disp: 30 tablet, Rfl: 0   Misc. Devices (PULSE OXIMETER) MISC, 1 Units by Does not apply route as needed., Disp: 1 each, Rfl: 0   Multiple Vitamin (MULTIVITAMIN WITH MINERALS) TABS tablet, Take 1 tablet by mouth daily., Disp: , Rfl:    Omega-3 Fatty Acids (OMEGA 3 PO), Take 4 capsules by mouth daily., Disp: , Rfl:    polyethylene glycol powder (GLYCOLAX/MIRALAX) 17 GM/SCOOP powder, dissolve  1 capful in water and take by daily as needed for mild constipation., Disp: 510 g, Rfl: 0   polyvinyl alcohol (LIQUIFILM TEARS) 1.4 % ophthalmic solution, Place 1 drop into both eyes daily as needed  for dry eyes., Disp: , Rfl:    Pyridoxine HCl (VITAMIN B-6 PO), Take 1 tablet by mouth daily., Disp: , Rfl:    triamcinolone (KENALOG) 0.025 % ointment, Apply 1 application topically 2 (two) times daily., Disp: 30 g, Rfl: 0  Past Medical History: Past Medical History:  Diagnosis Date   Bronchiectasis (HCC)    Hypertension    Pulmonary TB 2018    Tobacco Use: Social History   Tobacco Use  Smoking Status Never  Smokeless Tobacco Never    Labs: Recent Review Flowsheet Data     Labs for ITP Cardiac and Pulmonary Rehab Latest Ref Rng & Units 03/12/2021   Hemoglobin A1c 4.8 - 5.6 % 6.3(H)        Pulmonary Assessment Scores:  Pulmonary Assessment Scores     Row Name 05/15/21 1645         ADL UCSD   SOB Score total 106     Rest 5     Walk 5     Stairs 5     Bath 4     Dress 4     Shop 5           CAT Score     CAT Score 29           mMRC Score     mMRC Score 4             UCSD: Self-administered rating of dyspnea associated with activities of daily  living (ADLs) 6-point scale (0 = "not at all" to 5 = "maximal or unable to do because of breathlessness")  Scoring Scores range from 0 to 120.  Minimally important difference is 5 units  CAT: CAT can identify the health impairment of COPD patients and is better correlated with disease progression.  CAT has a scoring range of zero to 40. The CAT score is classified into four groups of low (less than 10), medium (10 - 20), high (21-30) and very high (31-40) based on the impact level of disease on health status. A CAT score over 10 suggests significant symptoms.  A worsening CAT score could be explained by an exacerbation, poor medication adherence, poor inhaler technique, or progression of COPD or comorbid conditions.  CAT MCID is 2 points  mMRC: mMRC (Modified Medical Research Council) Dyspnea Scale is used to assess the degree of baseline functional disability in patients of respiratory disease due to  dyspnea. No minimal important difference is established. A decrease in score of 1 point or greater is considered a positive change.   Pulmonary Function Assessment:   Exercise Target Goals: Exercise Program Goal: Individual exercise prescription set using results from initial 6 min walk test and THRR while considering  patient's activity barriers and safety.   Exercise Prescription Goal: Initial exercise prescription builds to 30-45 minutes a day of aerobic activity, 2-3 days per week.  Home exercise guidelines will be given to patient during program as part of exercise prescription that the participant will acknowledge.  Education: Aerobic Exercise: - Group verbal and visual presentation on the components of exercise prescription. Introduces F.I.T.T principle from ACSM for exercise prescriptions.  Reviews F.I.T.T. principles of aerobic exercise including progression. Written material given at graduation.   Education: Resistance Exercise: - Group verbal and visual presentation on the components of exercise prescription. Introduces F.I.T.T principle from ACSM for exercise prescriptions  Reviews F.I.T.T. principles of resistance exercise including progression. Written material given at graduation. Flowsheet Row Pulmonary Rehab from 06/28/2021 in [facility] Cardiac and Pulmonary Rehab  Date 05/24/21  Educator KL  Instruction Review Code 1- Verbalizes Understanding        Education: Exercise & Equipment Safety: - Individual verbal instruction and demonstration of equipment use and safety with use of the equipment. Flowsheet Row Pulmonary Rehab from 06/28/2021 in [facility] Cardiac and Pulmonary Rehab  Date 05/15/21  Educator AS  Instruction Review Code 1- Verbalizes Understanding       Education: Exercise Physiology & General Exercise Guidelines: - Group verbal and written instruction with models to review the exercise physiology of the cardiovascular system and associated critical values. Provides  general exercise guidelines with specific guidelines to those with heart or lung disease.    Education: Flexibility, Balance, Mind/Body Relaxation: - Group verbal and visual presentation with interactive activity on the components of exercise prescription. Introduces F.I.T.T principle from ACSM for exercise prescriptions. Reviews F.I.T.T. principles of flexibility and balance exercise training including progression. Also discusses the mind body connection.  Reviews various relaxation techniques to help reduce and manage stress (i.e. Deep breathing, progressive muscle relaxation, and visualization). Balance handout provided to take home. Written material given at graduation.   Activity Barriers & Risk Stratification:  Activity Barriers & Cardiac Risk Stratification - 05/05/21 1419       Activity Barriers & Cardiac Risk Stratification   Activity Barriers Muscular Weakness;Shortness of Breath   Leg weak            6 Minute Walk:  6 Minute Walk  Row Name 05/15/21 1637         6 Minute Walk   Distance 712 feet     Walk Time 4.5 minutes     # of Rest Breaks 2     MPH 1.8     METS 2.47     RPE 20     Perceived Dyspnea  4     VO2 Peak 8.64     Symptoms Yes (comment)     Comments SOB     Resting HR 96 bpm     Resting BP 106/60     Resting Oxygen Saturation  95 %     Exercise Oxygen Saturation  during 6 min walk 94 %     Max Ex. HR 114 bpm     Max Ex. BP 134/64     2 Minute Post BP 114/64           Interval HR     1 Minute HR 110     2 Minute HR 108     3 Minute HR 111     4 Minute HR 108     5 Minute HR 111     6 Minute HR 114     2 Minute Post HR 99     Interval Heart Rate? Yes           Interval Oxygen     Interval Oxygen? Yes     Baseline Oxygen Saturation % 95 %     1 Minute Oxygen Saturation % 95 %     2 Minute Oxygen Saturation % 93 %     3 Minute Oxygen Saturation % 95 %     4 Minute Oxygen Saturation % 94 %     5 Minute Oxygen Saturation % 96 %      6 Minute Oxygen Saturation % 94 %     2 Minute Post Oxygen Saturation % 96 %            Oxygen Initial Assessment:  Oxygen Initial Assessment - 05/05/21 1420       Home Oxygen   Home Oxygen Device None    Sleep Oxygen Prescription None    Home Exercise Oxygen Prescription None    Home Resting Oxygen Prescription None    Compliance with Home Oxygen Use Yes      Intervention   Short Term Goals To learn and understand importance of maintaining oxygen saturations>88%;To learn and demonstrate proper pursed lip breathing techniques or other breathing techniques. ;To learn and demonstrate proper use of respiratory medications    Long  Term Goals Exhibits compliance with exercise, home  and travel O2 prescription;Exhibits proper breathing techniques, such as pursed lip breathing or other method taught during program session;Compliance with respiratory medication;Demonstrates proper use of MDI's             Oxygen Re-Evaluation:  Oxygen Re-Evaluation     Row Name 05/22/21 1422 06/15/21 1344           Program Oxygen Prescription   Program Oxygen Prescription -- None             Home Oxygen      Home Oxygen Device None None      Sleep Oxygen Prescription None None      Home Exercise Oxygen Prescription None None      Home Resting Oxygen Prescription None None      Compliance with Home Oxygen Use Yes Yes  Goals/Expected Outcomes      Short Term Goals To learn and understand importance of maintaining oxygen saturations>88%;To learn and demonstrate proper pursed lip breathing techniques or other breathing techniques.  To learn and demonstrate proper use of respiratory medications      Long  Term Goals Exhibits compliance with exercise, home  and travel O2 prescription;Exhibits proper breathing techniques, such as pursed lip breathing or other method taught during program session;Compliance with respiratory medication Demonstrates proper use of MDI's      Comments  Reviewed PLB technique with pt.  Talked about how it works and it's importance in maintaining their exercise saturations. Informed Victoria Mendoza how to use a spacer and when to take her Albuterol when she is short of breath. She understands how to use it but does not use it much because it makes her feel jittery. Informed her to talk to her doctor about her shortness of breath and wheezing. Her lung sounds often have alot of wheezing when she comes to rehab. She states she has not yet talked to her doctor about it.      Goals/Expected Outcomes Short: Become more profiecient at using PLB.   Long: Become independent at using PLB. Short: speak with her doctor about her breathing. Long: maintain taking medication as needed independently.              Oxygen Discharge (Final Oxygen Re-Evaluation):  Oxygen Re-Evaluation - 06/15/21 1344       Program Oxygen Prescription   Program Oxygen Prescription None      Home Oxygen   Home Oxygen Device None    Sleep Oxygen Prescription None    Home Exercise Oxygen Prescription None    Home Resting Oxygen Prescription None    Compliance with Home Oxygen Use Yes      Goals/Expected Outcomes   Short Term Goals To learn and demonstrate proper use of respiratory medications    Long  Term Goals Demonstrates proper use of MDI's    Comments Informed Victoria Mendoza how to use a spacer and when to take her Albuterol when she is short of breath. She understands how to use it but does not use it much because it makes her feel jittery. Informed her to talk to her doctor about her shortness of breath and wheezing. Her lung sounds often have alot of wheezing when she comes to rehab. She states she has not yet talked to her doctor about it.    Goals/Expected Outcomes Short: speak with her doctor about her breathing. Long: maintain taking medication as needed independently.             Initial Exercise Prescription:  Initial Exercise Prescription - 05/15/21 1600       Date of  Initial Exercise RX and Referring Provider   Date 05/15/21    Referring Provider Orvan Falconer      Treadmill   MPH 1.5    Grade 0    Minutes 15    METs 2      Recumbant Bike   Level 1    RPM 60    Minutes 15    METs 2      NuStep   Level 1    SPM 80    Minutes 15    METs 2      Biostep-RELP   Level 1    SPM 50    Minutes 15    METs 2      Prescription Details   Frequency (times per week)  3    Duration Progress to 30 minutes of continuous aerobic without signs/symptoms of physical distress      Intensity   THRR 40-80% of Max Heartrate 119-142    Ratings of Perceived Exertion 11-15    Perceived Dyspnea 0-4      Progression   Progression Continue to progress workloads to maintain intensity without signs/symptoms of physical distress.      Resistance Training   Training Prescription Yes    Weight 3 lb    Reps 10-15             Perform Capillary Blood Glucose checks as needed.  Exercise Prescription Changes:   Exercise Prescription Changes     Row Name 05/15/21 1600 05/23/21 0800 06/05/21 1500 06/19/21 1300 06/19/21 1400     Response to Exercise   Blood Pressure (Admit) 106/60 102/58 108/62 102/58 --   Blood Pressure (Exercise) 134/64 128/70 126/62 -- --   Blood Pressure (Exit) 114/64 95/58 100/64 102/64 --   Heart Rate (Admit) 96 bpm 100 bpm 82 bpm 94 bpm --   Heart Rate (Exercise) 114 bpm 124 bpm 121 bpm 118 bpm --   Heart Rate (Exit) 99 bpm 100 bpm 117 bpm 120 bpm --   Oxygen Saturation (Admit) 95 % 96 % 98 % 100 % --   Oxygen Saturation (Exercise) 93 % 93 % 94 % 94 % --   Oxygen Saturation (Exit) 96 % 96 % 96 % 95 % --   Rating of Perceived Exertion (Exercise) --   Perceived Dyspnea (Exercise) --   Symptoms SOB SOB SOB, fatigue SOB --   Comments -- first day -- -- --   Duration -- -- Progress to 30 minutes of  aerobic without signs/symptoms of physical distress Progress to 30 minutes of  aerobic without signs/symptoms of physical  distress --   Intensity -- -- THRR unchanged THRR unchanged --     Progression   Progression -- Continue to progress workloads to maintain intensity without signs/symptoms of physical distress. Continue to progress workloads to maintain intensity without signs/symptoms of physical distress. Continue to progress workloads to maintain intensity without signs/symptoms of physical distress. --   Average METs -- 3.4 3.56 3.3 --     Resistance Training   Training Prescription -- Yes Yes Yes --   Weight -- 3 lb 3 lb 3 lb --   Reps -- 10-15 10-15 10-15 --     Interval Training   Interval Training -- No No No --     Treadmill   MPH -- 1.5 2 2.1 --   Grade -- 0 0.5 0.5 --   Minutes -- --   METs -- 2.15 2.67 2.75 --     NuStep   Level -- -- 1 -- --   Minutes -- -- 15 -- --   METs -- -- 4.5 -- --     REL-XR   Level -- --   Minutes -- --   METs -- 4.7 3.5 3.8 --     Home Exercise Plan   Plans to continue exercise at -- -- -- -- Home (comment)  Treadmill, weights   Frequency -- -- -- -- Add 2 additional days to program exercise sessions.   Initial Home Exercises Provided -- -- -- -- 06/19/21    Row Name 07/03/21 1200  Response to Exercise   Blood Pressure (Admit) 124/68       Blood Pressure (Exit) 106/64       Heart Rate (Admit) 105 bpm       Heart Rate (Exercise) 128 bpm       Heart Rate (Exit) 124 bpm       Oxygen Saturation (Admit) 94 %       Oxygen Saturation (Exercise) 95 %       Oxygen Saturation (Exit) 96 %       Rating of Perceived Exertion (Exercise) 15       Perceived Dyspnea (Exercise) 3       Symptoms SOB       Duration Continue with 30 min of aerobic exercise without signs/symptoms of physical distress.       Intensity THRR unchanged               Progression     Progression Continue to progress workloads to maintain intensity without signs/symptoms of physical distress.       Average METs 3.1               Resistance  Training     Training Prescription Yes       Weight 4 lb       Reps 10-15               Interval Training     Interval Training No               Treadmill     MPH 2.3       Grade 3       Minutes 15       METs 3.71               Recumbant Bike     Level 2       Minutes 15               REL-XR     Level 4       Minutes 15       METs 2.5               Home Exercise Plan     Plans to continue exercise at Home (comment)  Treadmill, weights       Frequency Add 2 additional days to program exercise sessions.       Initial Home Exercises Provided 06/19/21               Exercise Comments:   Exercise Comments     Row Name 05/22/21 1421           Exercise Comments First full day of exercise!  Patient was oriented to gym and equipment including functions, settings, policies, and procedures.  Patient's individual exercise prescription and treatment plan were reviewed.  All starting workloads were established based on the results of the 6 minute walk test done at initial orientation visit.  The plan for exercise progression was also introduced and progression will be customized based on patient's performance and goals.                Exercise Goals and Review:   Exercise Goals     Row Name 05/15/21 1642             Exercise Goals   Increase Physical Activity Yes       Intervention Provide advice, education, support and counseling about physical activity/exercise needs.;Develop an individualized exercise prescription for  aerobic and resistive training based on initial evaluation findings, risk stratification, comorbidities and participant's personal goals.       Expected Outcomes Short Term: Attend rehab on a regular basis to increase amount of physical activity.;Long Term: Add in home exercise to make exercise part of routine and to increase amount of physical activity.;Long Term: Exercising regularly at least 3-5 days a week.       Increase Strength and Stamina  Yes       Intervention Provide advice, education, support and counseling about physical activity/exercise needs.;Develop an individualized exercise prescription for aerobic and resistive training based on initial evaluation findings, risk stratification, comorbidities and participant's personal goals.       Expected Outcomes Short Term: Increase workloads from initial exercise prescription for resistance, speed, and METs.;Long Term: Improve cardiorespiratory fitness, muscular endurance and strength as measured by increased METs and functional capacity ( )       Able to understand and use rate of perceived exertion (RPE) scale Yes       Intervention Provide education and explanation on how to use RPE scale       Expected Outcomes Short Term: Able to use RPE daily in rehab to express subjective intensity level;Long Term:  Able to use RPE to guide intensity level when exercising independently       Able to understand and use Dyspnea scale Yes       Intervention Provide education and explanation on how to use Dyspnea scale       Expected Outcomes Short Term: Able to use Dyspnea scale daily in rehab to express subjective sense of shortness of breath during exertion;Long Term: Able to use Dyspnea scale to guide intensity level when exercising independently       Knowledge and understanding of Target Heart Rate Range (THRR) Yes       Intervention Provide education and explanation of THRR including how the numbers were predicted and where they are located for reference       Expected Outcomes Short Term: Able to state/look up THRR;Short Term: Able to use daily as guideline for intensity in rehab;Long Term: Able to use THRR to govern intensity when exercising independently       Able to check pulse independently Yes       Intervention Provide education and demonstration on how to check pulse in carotid and radial arteries.;Review the importance of being able to check your own pulse for safety during  independent exercise       Expected Outcomes Short Term: Able to explain why pulse checking is important during independent exercise;Long Term: Able to check pulse independently and accurately       Understanding of Exercise Prescription Yes       Intervention Provide education, explanation, and written materials on patient's individual exercise prescription       Expected Outcomes Short Term: Able to explain program exercise prescription;Long Term: Able to explain home exercise prescription to exercise independently                Exercise Goals Re-Evaluation :  Exercise Goals Re-Evaluation     Row Name 05/22/21 1421 06/05/21 1553 06/15/21 1351 06/19/21 1310 06/19/21 1416     Exercise Goal Re-Evaluation   Exercise Goals Review Increase Physical Activity;Able to understand and use rate of perceived exertion (RPE) scale;Knowledge and understanding of Target Heart Rate Range (THRR);Understanding of Exercise Prescription;Increase Strength and Stamina;Able to understand and use Dyspnea scale;Able to check pulse independently Increase Physical Activity;Increase Strength and  Stamina;Understanding of Exercise Prescription Increase Strength and Stamina;Increase Physical Activity Increase Physical Activity;Increase Strength and Stamina Increase Physical Activity;Increase Strength and Stamina   Comments Reviewed RPE and dyspnea scales, THR and program prescription with pt today.  Pt voiced understanding and was given a copy of goals to take home. Victoria Mendoza has completed 6 full days of exercise.  She continues to have difficulty with leg weakness and is worried that she is over doing it when he legs begin to hurt and feel tired.  We have encouraged rest breaks and tried a variety of equipment options to help her ability to exericse.  We will conitnue to monitor her progress. Victoria Mendoza has a bike and a treadmill at home. She stretches at home and does alot of balance exercise. She is still concerned with her leg  weakness. 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 Victoria Mendoza does feel weak a lot with exercise.  Staff will review home exercise and encourage doing some squats at home to help with lower body strength. Reviewed home exercise with pt today.  Pt plans to walk and do weights for exercise. Patient uses handweights at home, went over structure on resistance training, encouraged to complete 2 sets if can. Patient is discouraged and doesn't feel that she is progressing.  Reviewed THR, pulse, RPE, sign and symptoms, pulse oximetery and when to call 911 or MD.  Also discussed weather considerations and indoor options.  Pt voiced understanding.   Expected Outcomes Short: Use RPE daily to regulate intensity. Long: Follow program prescription in THR. Short: Continue to attend rehab regularly Long: Continue to build stamina in legs Short: maintain exercise in [facility]. Long: maintain exercise at home independently. Short: review home exercise with EP Long:  build lower body strength Short: Continue to check HR and O2 when exercising Long: Continue to exercise independently at appropriate prescription    Row Name 07/03/21 1156             Exercise Goal Re-Evaluation   Exercise Goals Review Increase Physical Activity;Increase Strength and Stamina;Understanding of Exercise Prescription       Comments Victoria Mendoza is doing well in rehab.  She is on level 4 on the XR. We will continue to monitor her progress.       Expected Outcomes Short: Increase workload on bike Long: Continue to improve stamina                Discharge Exercise Prescription (Final Exercise Prescription Changes):  Exercise Prescription Changes - 07/03/21 1200       Response to Exercise   Blood Pressure (Admit) 124/68    Blood Pressure (Exit) 106/64    Heart Rate (Admit) 105 bpm    Heart Rate (Exercise) 128 bpm    Heart Rate (Exit) 124 bpm    Oxygen Saturation (Admit) 94 %     Oxygen Saturation (Exercise) 95 %    Oxygen Saturation (Exit) 96 %    Rating of Perceived Exertion (Exercise) 15    Perceived Dyspnea (Exercise) 3    Symptoms SOB    Duration Continue with 30 min of aerobic exercise without signs/symptoms of physical distress.    Intensity THRR unchanged      Progression   Progression Continue to progress workloads to maintain intensity without signs/symptoms of physical distress.    Average METs 3.1      Resistance Training   Training Prescription Yes  Weight 4 lb    Reps 10-15      Interval Training   Interval Training No      Treadmill   MPH 2.3    Grade 3    Minutes 15    METs 3.71      Recumbant Bike   Level 2    Minutes 15      REL-XR   Level 4    Minutes 15    METs 2.5      Home Exercise Plan   Plans to continue exercise at Home (comment)   Treadmill, weights   Frequency Add 2 additional days to program exercise sessions.    Initial Home Exercises Provided 06/19/21             Nutrition:  Target Goals: Understanding of nutrition guidelines, daily intake of sodium 1500mg , cholesterol 200mg , calories 30% from fat and 7% or less from saturated fats, daily to have 5 or more servings of fruits and vegetables.  Education: All About Nutrition: -Group instruction provided by verbal, written material, interactive activities, discussions, models, and posters to present general guidelines for heart healthy nutrition including fat, fiber, MyPlate, the role of sodium in heart healthy nutrition, utilization of the nutrition label, and utilization of this knowledge for meal planning. Follow up email sent as well. Written material given at graduation. Flowsheet Row Pulmonary Rehab from 06/28/2021 in [facility] Cardiac and Pulmonary Rehab  Date 06/07/21  Educator [facility]  Instruction Review Code 1- Verbalizes Understanding       Biometrics:  Pre Biometrics - 05/15/21 1643       Pre Biometrics   Height  (1.549 m)    Weight 109 lb  (49.4 kg)    BMI (Calculated) 20.61    Single Leg Stand 30 seconds              Nutrition Therapy Plan and Nutrition Goals:  Nutrition Therapy & Goals - 06/13/21 1008       Nutrition Therapy   Diet Heart healthy, low Na, Pulmonary MNT    Protein (specify units) 60-65g    Fiber 25 grams    Whole Grain Foods 3 servings    Saturated Fats 12 max. grams    Fruits and Vegetables 8 servings/day    Sodium 1.5 grams      Personal Nutrition Goals   Nutrition Goal ST: continue to add fat to meals when possible, try for more frequent meals, drink in between meals LT: gain weight    Comments Victoria Mendoza reports having lower appetite - she feels more short of breath after eating. 3 meals per day with fruit in between meals. She has someone who helps her. B: eggs and tortillas and tomatoes and coffee - black. She uses olive oil. L: soup or vegetables and protein and rice, she enjoys salads in addiiton to whatever lunch is S: fruit or mozarella cheese D: this is usually her larger. Protein with vegetables and carboydrates. She reports her weight has been steady, but she would like to gain some weight. She will also make smoothies with berries and berries and protein powder- suggested adding a fat to this to help increase calories. She rpeorts eating avocado a lot with her meals. 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 Reviewed general healthy eating and discussed pulmonary MNT - small fequent meals, eating protein foods first, adding fat to whatever she is eating, drinking in between meals.      Intervention Plan  Intervention Prescribe, educate and counsel regarding individualized specific dietary modifications aiming towards targeted core components such as weight, hypertension, lipid management, diabetes, heart failure and other comorbidities.;Nutrition handout(s) given to patient.    Expected Outcomes Short Term Goal: Understand basic principles of dietary  content, such as calories, fat, sodium, cholesterol and nutrients.;Short Term Goal: A plan has been developed with personal nutrition goals set during dietitian appointment.;Long Term Goal: Adherence to prescribed nutrition plan.             Nutrition Assessments:  MEDIFICTS Score Key: ?70 Need to make dietary changes  40-70 Heart Healthy Diet ? 40 Therapeutic Level Cholesterol Diet  Flowsheet Row Pulmonary Rehab from 05/15/2021 in [facility] Cardiac and Pulmonary Rehab  Picture Your Plate Total Score on Admission 62      Picture Your Plate Scores: <16 Unhealthy dietary pattern with much room for improvement. 41-50 Dietary pattern unlikely to meet recommendations for good health and room for improvement. 51-60 More healthful dietary pattern, with some room for improvement.  >60 Healthy dietary pattern, although there may be some specific behaviors that could be improved.   Nutrition Goals Re-Evaluation:   Nutrition Goals Discharge (Final Nutrition Goals Re-Evaluation):   Psychosocial: Target Goals: Acknowledge presence or absence of significant depression and/or stress, maximize coping skills, provide positive support system. Participant is able to verbalize types and ability to use techniques and skills needed for reducing stress and depression.   Education: Stress, Anxiety, and Depression - Group verbal and visual presentation to define topics covered.  Reviews how body is impacted by stress, anxiety, and depression.  Also discusses healthy ways to reduce stress and to treat/manage anxiety and depression.  Written material given at graduation.   Education: Sleep Hygiene -Provides group verbal and written instruction about how sleep can affect your health.  Define sleep hygiene, discuss sleep cycles and impact of sleep habits. Review good sleep hygiene tips.    Initial Review & Psychosocial Screening:  Initial Psych Review & Screening - 05/05/21 1422       Initial Review    Current issues with Current Stress Concerns    Source of Stress Concerns Chronic Illness    Comments Sometimes feelings of desperation about my illness.      Family Dynamics   Good Support System? Yes   2 sisters, 40 min and 1 hour away. Both come weekly to help.     Barriers   Psychosocial barriers to participate in program There are no identifiable barriers or psychosocial needs.      Screening Interventions   Interventions Encouraged to exercise    Expected Outcomes Short Term goal: Utilizing psychosocial counselor, staff and physician to assist with identification of specific Stressors or current issues interfering with healing process. Setting desired goal for each stressor or current issue identified.;Long Term Goal: Stressors or current issues are controlled or eliminated.;Short Term goal: Identification and review with participant of any Quality of Life or Depression concerns found by scoring the questionnaire.;Long Term goal: The participant improves quality of Life and PHQ9 Scores as seen by post scores and/or verbalization of changes             Quality of Life Scores:  Scores of 19 and below usually indicate a poorer quality of life in these areas.  A difference of  2-3 points is a clinically meaningful difference.  A difference of 2-3 points in the total score of the Quality of Life Index has been associated with significant improvement in overall  quality of life, self-image, physical symptoms, and general health in studies assessing change in quality of life.  PHQ-9: Recent Review Flowsheet Data     Depression screen [facility] 2/9 06/12/2021 05/15/2021 05/03/2021 04/26/2021 03/28/2021   Decreased Interest 3  3 0 0 0   Down, Depressed, Hopeless 1 2 0 0 0   PHQ - 2 Score 4 5 0 0 0   Altered sleeping 2 0 - - -   Tired, decreased energy 3 3 - - -   Change in appetite 1 2 - - -   Feeling bad or failure about yourself  0 0 - - -   Trouble concentrating 0 0 - - -   Moving slowly or  fidgety/restless 0 0 - - -   Suicidal thoughts 0 0 - - -   PHQ-9 Score 10 10 - - -   Difficult doing work/chores Very difficult - - - -      Interpretation of Total Score  Total Score Depression Severity:  1-4 = Minimal depression, 5-9 = Mild depression, 10-14 = Moderate depression, 15-19 = Moderately severe depression, 20-27 = Severe depression   Psychosocial Evaluation and Intervention:  Psychosocial Evaluation - 05/05/21 1441       Psychosocial Evaluation & Interventions   Interventions Encouraged to exercise with the program and follow exercise prescription    Comments Victoria Mendoza has no barriers to attending the program. She lives alone. She has 2 sisters that live an hour or less away that are at her home weekly or more to help her. She wants to attend and see if she can improve her shortness of breath symptoms and improove her activitiy levels.  She states she does not have depression, but does some time feel a desperation over her chronic illness. She is limited in her activities because of her shortness of breath. SHe does listen to music and watch some TV to keep herslf occupied.   She should do well in the program as she is ready to work on doing what she can to improve her activity levels. She is already using a treadmill at home for short sessions.    Expected Outcomes STG: Victoria Mendoza attends all scheduled sessions. She is able to progress with her exercise levels and gain staminaand decreased shortness of breath. LTG: Victoria Mendoza continues with her progress to enable her to be more active longer periods of time.    Continue Psychosocial Services  Follow up required by staff             Psychosocial Re-Evaluation:  Psychosocial Re-Evaluation     Row Name 06/12/21 1420             Psychosocial Re-Evaluation   Comments Reviewed patient health questionnaire (PHQ-9) with patient for follow up. Previously, patients score indicated signs/symptoms of depression.  Reviewed to see if  patient is improving symptom wise while in program.  Score has stayed the same and it is due to her breathing and her physical limitations.       Expected Outcomes Short: Continue to attend [facility] regularly for regular exercise and social engagement. Long: Continue to improve symptoms and manage a positive mental state.       Interventions Encouraged to attend Pulmonary Rehabilitation for the exercise       Continue Psychosocial Services  Follow up required by staff                Psychosocial Discharge (Final Psychosocial Re-Evaluation):  Psychosocial Re-Evaluation - 06/12/21  1420       Psychosocial Re-Evaluation   Comments Reviewed patient health questionnaire (PHQ-9) with patient for follow up. Previously, patients score indicated signs/symptoms of depression.  Reviewed to see if patient is improving symptom wise while in program.  Score has stayed the same and it is due to her breathing and her physical limitations.    Expected Outcomes Short: Continue to attend [facility] regularly for regular exercise and social engagement. Long: Continue to improve symptoms and manage a positive mental state.    Interventions Encouraged to attend Pulmonary Rehabilitation for the exercise    Continue Psychosocial Services  Follow up required by staff             Education: Education Goals: Education classes will be provided on a weekly basis, covering required topics. Participant will state understanding/return demonstration of topics presented.  Learning Barriers/Preferences:  Learning Barriers/Preferences - 05/05/21 1430       Learning Barriers/Preferences   Learning Barriers None    Learning Preferences None             General Pulmonary Education Topics:  Infection Prevention: - Provides verbal and written material to individual with discussion of infection control including proper hand washing and proper equipment cleaning during exercise session. Flowsheet Row Pulmonary  Rehab from 06/28/2021 in [place] Cardiac and Pulmonary Rehab  Date 05/15/21  Educator AS  Instruction Review Code 1- Verbalizes Understanding       Falls Prevention: - Provides verbal and written material to individual with discussion of falls prevention and safety. Flowsheet Row Pulmonary Rehab from 06/28/2021 in [facility] Cardiac and Pulmonary Rehab  Date 05/15/21  Educator AS  Instruction Review Code 1- Verbalizes Understanding       Chronic Lung Disease Review: - Group verbal instruction with posters, models, PowerPoint presentations and videos,  to review new updates, new respiratory medications, new advancements in procedures and treatments. Providing information on websites and "800" numbers for continued self-education. Includes information about supplement oxygen, available portable oxygen systems, continuous and intermittent flow rates, oxygen safety, concentrators, and Medicare reimbursement for oxygen. Explanation of Pulmonary Drugs, including class, frequency, complications, importance of spacers, rinsing mouth after steroid MDI's, and proper cleaning methods for nebulizers. Review of basic lung anatomy and physiology related to function, structure, and complications of lung disease. Review of risk factors. Discussion about methods for diagnosing sleep apnea and types of masks and machines for OSA. Includes a review of the use of types of environmental controls: home humidity, furnaces, filters, dust mite/pet prevention, HEPA vacuums. Discussion about weather changes, air quality and the benefits of nasal washing. Instruction on Warning signs, infection symptoms, calling MD promptly, preventive modes, and value of vaccinations. Review of effective airway clearance, coughing and/or vibration techniques. Emphasizing that all should Create an Action Plan. Written material given at graduation. Flowsheet Row Pulmonary Rehab from 06/28/2021 in [facility] Cardiac and Pulmonary Rehab  Date 06/28/21  Educator  [facility]  Instruction Review Code 1- Verbalizes Understanding       AED/CPR: - Group verbal and written instruction with the use of models to demonstrate the basic use of the AED with the basic ABC's of resuscitation.    Anatomy and Cardiac Procedures: - Group verbal and visual presentation and models provide information about basic cardiac anatomy and function. Reviews the testing methods done to diagnose heart disease and the outcomes of the test results. Describes the treatment choices: Medical Management, Angioplasty, or Coronary Bypass Surgery for treating various heart conditions including Myocardial Infarction,  Angina, Valve Disease, and Cardiac Arrhythmias.  Written material given at graduation. Flowsheet Row Pulmonary Rehab from 06/28/2021 in [facility] Cardiac and Pulmonary Rehab  Date 05/24/21  Educator [facility]  Instruction Review Code 1- Verbalizes Understanding       Medication Safety: - Group verbal and visual instruction to review commonly prescribed medications for heart and lung disease. Reviews the medication, class of the drug, and side effects. Includes the steps to properly store meds and maintain the prescription regimen.  Written material given at graduation.   Other: -Provides group and verbal instruction on various topics (see comments)   Knowledge Questionnaire Score:  Knowledge Questionnaire Score - 05/15/21 1647       Knowledge Questionnaire Score   Pre Score 4/18              Core Components/Risk Factors/Patient Goals at Admission:  Personal Goals and Risk Factors at Admission - 05/15/21 1644       Core Components/Risk Factors/Patient Goals on Admission    Weight Management Yes;Weight Gain    Intervention Weight Management: Develop a combined nutrition and exercise program designed to reach desired caloric intake, while maintaining appropriate intake of nutrient and fiber, sodium and fats, and appropriate energy expenditure required for the weight  goal.;Weight Management: Provide education and appropriate resources to help participant work on and attain dietary goals.    Admit Weight 108 lb (49 kg)    Goal Weight: Short Term 115 lb (52.2 kg)    Goal Weight: Long Term 115 lb (52.2 kg)    Expected Outcomes Short Term: Continue to assess and modify interventions until short term weight is achieved;Long Term: Adherence to nutrition and physical activity/exercise program aimed toward attainment of established weight goal;Weight Gain: Understanding of general recommendations for a high calorie, high protein meal plan that promotes weight gain by distributing calorie intake throughout the day with the consumption for 4-5 meals, snacks, and/or supplements    Intervention Provide education, individualized exercise plan and daily activity instruction to help decrease symptoms of SOB with activities of daily living.    Expected Outcomes Short Term: Improve cardiorespiratory fitness to achieve a reduction of symptoms when performing ADLs;Long Term: Be able to perform more ADLs without symptoms or delay the onset of symptoms    Increase knowledge of respiratory medications and ability to use respiratory devices properly  Yes    Intervention Provide education and demonstration as needed of appropriate use of medications, inhalers, and oxygen therapy.    Expected Outcomes Short Term: Achieves understanding of medications use. Understands that oxygen is a medication prescribed by physician. Demonstrates appropriate use of inhaler and oxygen therapy.;Long Term: Maintain appropriate use of medications, inhalers, and oxygen therapy.             Education:Diabetes - Individual verbal and written instruction to review signs/symptoms of diabetes, desired ranges of glucose level fasting, after meals and with exercise. Acknowledge that pre and post exercise glucose checks will be done for 3 sessions at entry of program.   Know Your Numbers and Heart Failure: -  Group verbal and visual instruction to discuss disease risk factors for cardiac and pulmonary disease and treatment options.  Reviews associated critical values for Overweight/Obesity, Hypertension, Cholesterol, and Diabetes.  Discusses basics of heart failure: signs/symptoms and treatments.  Introduces Heart Failure Zone chart for action plan for heart failure.  Written material given at graduation. Flowsheet Row Pulmonary Rehab from 06/28/2021 in [facility] Cardiac and Pulmonary Rehab  Date 06/21/21  Educator [facility]  Instruction Review Code 1- Verbalizes Understanding       Core Components/Risk Factors/Patient Goals Review:   Goals and Risk Factor Review     Row Name 06/15/21 1349             Core Components/Risk Factors/Patient Goals Review   Personal Goals Review Improve shortness of breath with ADL's       Review Spoke to patient about their shortness of breath and what they can do to improve. Patient has been informed of breathing techniques when starting the program. Patient is informed to tell staff if they have had any med changes and that certain meds they are taking or not taking can be causing shortness of breath.       Expected Outcomes Short: Attend [facility] regularly to improve shortness of breath with ADL's. Long: maintain independence with ADL's                Core Components/Risk Factors/Patient Goals at Discharge (Final Review):   Goals and Risk Factor Review - 06/15/21 1349       Core Components/Risk Factors/Patient Goals Review   Personal Goals Review Improve shortness of breath with ADL's    Review Spoke to patient about their shortness of breath and what they can do to improve. Patient has been informed of breathing techniques when starting the program. Patient is informed to tell staff if they have had any med changes and that certain meds they are taking or not taking can be causing shortness of breath.    Expected Outcomes Short: Attend [facility] regularly to  improve shortness of breath with ADL's. Long: maintain independence with ADL's             ITP Comments:  ITP Comments     Row Name 05/05/21 1447 05/22/21 1421 06/07/21 0551 06/13/21 1027 07/05/21 0635   ITP Comments Virtual orientation call completed today. shehas an appointment on Date: 05/15/2021  for EP eval and gym Orientation.  Documentation of diagnosis can be found in [facility]  Date: 04/26/2021 and 03/28/2021 . First full day of exercise!  Patient was oriented to gym and equipment including functions, settings, policies, and procedures.  Patient's individual exercise prescription and treatment plan were reviewed.  All starting workloads were established based on the results of the 6 minute walk test done at initial orientation visit.  The plan for exercise progression was also introduced and progression will be customized based on patient's performance and goals. 30 Day review completed. Medical Director ITP review done, changes made as directed, and signed approval by Medical Director.     New to program Completed initial RD consultation 30 Day review completed. Medical Director ITP review done, changes made as directed, and signed approval by Medical Director.            Comments:

## 2021-07-05 NOTE — Progress Notes (Signed)
Daily Session Note  Patient Details  Name: Zoraida Havrilla MRN: [id number] Date of Birth: 22-Feb-1956 Referring Provider:   Flowsheet Row Pulmonary Rehab from 05/15/2021 in [facility] Cardiac and Pulmonary Rehab  Referring Provider Megan Salon       Encounter Date: 07/05/2021  Check In:  Session Check In - 07/05/21 1327       Check-In   Supervising physician immediately available to respond to emergencies See telemetry face sheet for immediately available ER MD    Location [facility]-Cardiac & Pulmonary Rehab    Staff Present Birdie Sons, MPA, Nino Glow, MS, ASCM CEP, Exercise Physiologist;Laureen Owens Shark, BS, RRT, CPFT    Virtual Visit No    Medication changes reported     No    Fall or balance concerns reported    No    Warm-up and Cool-down Performed on first and last piece of equipment    Resistance Training Performed Yes    VAD Patient? No    PAD/SET Patient? No      Pain Assessment   Currently in Pain? No/denies                Social History   Tobacco Use  Smoking Status Never  Smokeless Tobacco Never    Goals Met:  Independence with exercise equipment Exercise tolerated well No report of cardiac concerns or symptoms Strength training completed today  Goals Unmet:  Not Applicable  Comments: Pt able to follow exercise prescription today without complaint.  Will continue to monitor for progression.    Dr. Emily Filbert is Medical Director for [place].  Dr. Ottie Glazier is Medical Director for [facility] Pulmonary Rehabilitation.

## 2021-07-06 ENCOUNTER — Other Ambulatory Visit: Payer: Self-pay | Admitting: Internal Medicine

## 2021-07-06 ENCOUNTER — Other Ambulatory Visit: Payer: Self-pay

## 2021-07-06 ENCOUNTER — Ambulatory Visit (HOSPITAL_COMMUNITY)
Admission: RE | Admit: 2021-07-06 | Discharge: 2021-07-06 | Disposition: A | Discharge status: Home / Self Care | Payer: Medicare HMO | Source: Ambulatory Visit | Attending: Internal Medicine | Admitting: Internal Medicine

## 2021-07-06 DIAGNOSIS — A310 Pulmonary mycobacterial infection: Secondary | ICD-10-CM | POA: Diagnosis not present

## 2021-07-06 DIAGNOSIS — Z452 Encounter for adjustment and management of vascular access device: Secondary | ICD-10-CM | POA: Diagnosis present

## 2021-07-06 MED ORDER — CHLORHEXIDINE GLUCONATE 4 % EX LIQD
CUTANEOUS | Status: AC
  Filled 2021-07-06: qty 15

## 2021-07-06 MED ORDER — LIDOCAINE HCL 1 % IJ SOLN
INTRAMUSCULAR | Status: AC
  Filled 2021-07-06: qty 20

## 2021-07-06 MED ORDER — HEPARIN SOD (PORK) LOCK FLUSH 100 UNIT/ML IV SOLN
INTRAVENOUS | Status: AC
  Filled 2021-07-06: qty 5

## 2021-07-10 ENCOUNTER — Other Ambulatory Visit: Payer: Self-pay

## 2021-07-10 DIAGNOSIS — A319 Mycobacterial infection, unspecified: Secondary | ICD-10-CM

## 2021-07-10 DIAGNOSIS — J432 Centrilobular emphysema: Secondary | ICD-10-CM

## 2021-07-10 DIAGNOSIS — A318 Other mycobacterial infections: Secondary | ICD-10-CM

## 2021-07-10 NOTE — Progress Notes (Signed)
Daily Session Note  Patient Details  Name: Victoria Mendoza MRN: [id number] Date of Birth: 1956/10/25 Referring Provider:   Flowsheet Row Pulmonary Rehab from 05/15/2021 in [facility] Cardiac and Pulmonary Rehab  Referring Provider Megan Salon       Encounter Date: 07/10/2021  Check In:  Session Check In - 07/10/21 1332       Check-In   Supervising physician immediately available to respond to emergencies See telemetry face sheet for immediately available ER MD    Location [facility]-Cardiac & Pulmonary Rehab    Staff Present Birdie Sons, MPA, Nino Glow, MS, ASCM CEP, Exercise Physiologist;Joseph Tessie Fass, [place]    Virtual Visit No    Medication changes reported     No    Fall or balance concerns reported    No    Warm-up and Cool-down Performed on first and last piece of equipment    Resistance Training Performed Yes    VAD Patient? No    PAD/SET Patient? No      Pain Assessment   Currently in Pain? No/denies                Social History   Tobacco Use  Smoking Status Never  Smokeless Tobacco Never    Goals Met:  Independence with exercise equipment Exercise tolerated well No report of cardiac concerns or symptoms Strength training completed today  Goals Unmet:  Not Applicable  Comments: Pt able to follow exercise prescription today without complaint.  Will continue to monitor for progression.    Dr. Emily Filbert is Medical Director for [place].  Dr. Ottie Glazier is Medical Director for [facility] Pulmonary Rehabilitation.

## 2021-07-12 ENCOUNTER — Ambulatory Visit (INDEPENDENT_AMBULATORY_CARE_PROVIDER_SITE_OTHER): Payer: Medicare HMO | Admitting: Internal Medicine

## 2021-07-12 ENCOUNTER — Other Ambulatory Visit: Payer: Self-pay

## 2021-07-12 DIAGNOSIS — R634 Abnormal weight loss: Secondary | ICD-10-CM | POA: Diagnosis not present

## 2021-07-12 DIAGNOSIS — J432 Centrilobular emphysema: Secondary | ICD-10-CM | POA: Diagnosis not present

## 2021-07-12 DIAGNOSIS — A318 Other mycobacterial infections: Secondary | ICD-10-CM

## 2021-07-12 DIAGNOSIS — A319 Mycobacterial infection, unspecified: Secondary | ICD-10-CM

## 2021-07-12 MED ORDER — MEGESTROL ACETATE 400 MG/10ML PO SUSP: 400.0000 mg | Freq: Every day | ORAL | 5 refills | Status: AC

## 2021-07-12 NOTE — Progress Notes (Signed)
Virtual Visit via Telephone Note  I connected with Victoria Mendoza on 07/12/21 at 10:30 AM EDT by telephone and verified that I am speaking with the correct person using two identifiers.  Location: Patient: Home Provider: [facility]   I discussed the limitations, risks, security and privacy concerns of performing an evaluation and management service by telephone and the availability of in person appointments. I also discussed with the patient that there may be a patient responsible charge related to this service. The patient expressed understanding and agreed to proceed.   History of Present Illness: I called and spoke with Victoria Mendoza today.  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  She has been on oral azithromycin, ethambutol and rifampin since May 2021.  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  She felt like she could not tolerate imipenem.  She was hospitalized briefly and discharged on IV tigecycline, amikacin and cefepime on 02/21/2021.  She has not had any obvious complications related to her antibiotics or PICC.  She started pulmonary rehab about 5 weeks ago.  She says that her oxygen saturation levels have been better when she is doing physical therapy but overall she says she is feeling much worse.  She says that she is coughing more and bringing up more dark sputum.  She has not had any hemoptysis.  She says she feels more short of breath and extremely fatigued.  Sputum AFB smear and culture obtained on 03/28/2021 were negative.  Repeat sputum smear on 06/13/2021 was negative but cultures are growing AFB again.  She followed-up with Dr. Valentina Lucks at [facility] on 07/10/2021.  He cleared her for surgery.  She is scheduled to follow-up with her thoracic surgeon, Dr. Ewing Schlein next week.    She recently had her PICC line replaced because of sluggish flow.  She has not had any problems tolerating her complicated antibiotic  regimen.  She has not had any fever, nausea, vomiting or diarrhea.  Appetite remains poor and she has been losing weight.   Observations/Objective: Sputum 03/28/2021: AFB smear and culture negative Sputum 06/13/2021: AFB smear negative; AFB culture positive Creatinine 07/11/2021: 0.57  Assessment and Plan: Fortunately she has been able to tolerate her complicated antibiotic regimen without significant adverse side effects.  I am going to give her a trial of Megace to see if we can help improve her appetite and keep her from losing more weight.  I have also suggested that she follow-up with her pulmonary provider to make sure that her lung function is optimized prior to possible upcoming lung surgery.  Follow Up Instructions: Continue current antibiotics Trial of Megace Follow-up here on 07/26/2021   I discussed the assessment and treatment plan with the patient. The patient was provided an opportunity to ask questions and all were answered. The patient agreed with the plan and demonstrated an understanding of the instructions.   The patient was advised to call back or seek an in-person evaluation if the symptoms worsen or if the condition fails to improve as anticipated.  I provided 18 minutes of non-face-to-face time during this encounter.   Cliffton Asters, MD

## 2021-07-12 NOTE — Progress Notes (Signed)
Daily Session Note  Patient Details  Name: Victoria Mendoza MRN: [id number] Date of Birth: Dec 25, 1955 Referring Provider:   Flowsheet Row Pulmonary Rehab from 05/15/2021 in [facility] Cardiac and Pulmonary Rehab  Referring Provider Megan Salon       Encounter Date: 07/12/2021  Check In:  Session Check In - 07/12/21 1330       Check-In   Supervising physician immediately available to respond to emergencies See telemetry face sheet for immediately available ER MD    Location [facility]-Cardiac & Pulmonary Rehab    Staff Present Birdie Sons, MPA, Nino Glow, MS, ASCM CEP, Exercise Physiologist;Joseph Tessie Fass, [place]    Virtual Visit No    Medication changes reported     No    Fall or balance concerns reported    No    Warm-up and Cool-down Performed on first and last piece of equipment    Resistance Training Performed Yes    VAD Patient? No    PAD/SET Patient? No      Pain Assessment   Currently in Pain? No/denies                Social History   Tobacco Use  Smoking Status Never  Smokeless Tobacco Never    Goals Met:  Independence with exercise equipment Exercise tolerated well No report of cardiac concerns or symptoms Strength training completed today  Goals Unmet:  Not Applicable  Comments: Pt able to follow exercise prescription today without complaint.  Will continue to monitor for progression.    Dr. Emily Filbert is Medical Director for [place].  Dr. Ottie Glazier is Medical Director for [facility] Pulmonary Rehabilitation.

## 2021-07-13 ENCOUNTER — Other Ambulatory Visit (HOSPITAL_COMMUNITY): Payer: Self-pay

## 2021-07-13 ENCOUNTER — Encounter: Payer: Self-pay | Admitting: Internal Medicine

## 2021-07-13 ENCOUNTER — Telehealth: Payer: Self-pay

## 2021-07-13 ENCOUNTER — Other Ambulatory Visit: Payer: Self-pay

## 2021-07-13 ENCOUNTER — Encounter: Payer: Medicare HMO | Admitting: *Deleted

## 2021-07-13 DIAGNOSIS — J432 Centrilobular emphysema: Secondary | ICD-10-CM

## 2021-07-13 NOTE — Progress Notes (Signed)
Daily Session Note  Patient Details  Name: Joyia Riehle MRN: [id number] Date of Birth: 12-Aug-1956 Referring Provider:   Flowsheet Row Pulmonary Rehab from 05/15/2021 in [facility] Cardiac and Pulmonary Rehab  Referring Provider Megan Salon       Encounter Date: 07/13/2021  Check In:  Session Check In - 07/13/21 1334       Check-In   Supervising physician immediately available to respond to emergencies See telemetry face sheet for immediately available ER MD    Location [facility]-Cardiac & Pulmonary Rehab    Staff Present Renita Papa, RN BSN;Joseph Tessie Fass, RCP,RRT,BSRT;Melissa [place], [place], LDN    Virtual Visit No    Medication changes reported     No    Fall or balance concerns reported    No    Warm-up and Cool-down Performed on first and last piece of equipment    Resistance Training Performed Yes    VAD Patient? No    PAD/SET Patient? No      Pain Assessment   Currently in Pain? No/denies                Social History   Tobacco Use  Smoking Status Never  Smokeless Tobacco Never    Goals Met:  Independence with exercise equipment Exercise tolerated well No report of cardiac concerns or symptoms Strength training completed today  Goals Unmet:  Not Applicable  Comments: Pt able to follow exercise prescription today without complaint.  Will continue to monitor for progression.    Dr. Emily Filbert is Medical Director for [place].  Dr. Ottie Glazier is Medical Director for [facility] Pulmonary Rehabilitation.

## 2021-07-13 NOTE — Telephone Encounter (Signed)
Patient called with concerns getting her megace. She says she doesn't think her insurance covers it. Per Lupita Leash, it should be no cost under her [employer], advised patient to call the pharmacy and provide them with her insurance information.   She is requesting prescription be sent to a different [facility], advised her to call the pharmacy to have prescription transferred. Patient verbalized understanding and has no further questions.   Sandie Ano, RN

## 2021-07-14 ENCOUNTER — Telehealth: Payer: Self-pay

## 2021-07-14 ENCOUNTER — Encounter: Payer: Self-pay | Admitting: Internal Medicine

## 2021-07-14 NOTE — Telephone Encounter (Addendum)
Patient states she reached out to [facility] and they do not have orders to continue IV antibiotics.   Staff reached out to [facility] unfortunately it is now after hours and they will page on call pharmacist  Spoke with Trey Paula.  Patient currently taking IV on IV tigecycline, amikacin and cefepime. Needs to continue medication per Dr. Orvan Falconer last noted on 07/12/21.  Routing to provider on call and Dr. Orvan Falconer. Provided [facility] with provider on call as the office will be closed.  Advised patient to follow up with [facility] for scheduled delivery [facility]

## 2021-07-17 ENCOUNTER — Encounter: Payer: Self-pay | Admitting: Internal Medicine

## 2021-07-17 ENCOUNTER — Other Ambulatory Visit: Payer: Self-pay

## 2021-07-17 DIAGNOSIS — A319 Mycobacterial infection, unspecified: Secondary | ICD-10-CM

## 2021-07-17 DIAGNOSIS — A318 Other mycobacterial infections: Secondary | ICD-10-CM

## 2021-07-17 DIAGNOSIS — J432 Centrilobular emphysema: Secondary | ICD-10-CM

## 2021-07-17 NOTE — Telephone Encounter (Signed)
Relayed verbal orders to [place] at [facility] to continue IV antibiotics at least through 07/26/21, patient's next appointment, per Dr. Orvan Falconer.   Sandie Ano, RN

## 2021-07-17 NOTE — Telephone Encounter (Signed)
Thanks

## 2021-07-17 NOTE — Progress Notes (Signed)
Daily Session Note  Patient Details  Name: Victoria Mendoza MRN: [id number] Date of Birth: 1955/12/06 Referring Provider:   Flowsheet Row Pulmonary Rehab from 05/15/2021 in [facility] Cardiac and Pulmonary Rehab  Referring Provider Megan Salon       Encounter Date: 07/17/2021  Check In:  Session Check In - 07/17/21 1343       Check-In   Supervising physician immediately available to respond to emergencies See telemetry face sheet for immediately available ER MD    Location [facility]-Cardiac & Pulmonary Rehab    Staff Present Birdie Sons, MPA, Nino Glow, MS, ASCM CEP, Exercise Physiologist;Joseph Tessie Fass, [place]    Virtual Visit No    Medication changes reported     No    Fall or balance concerns reported    No    Warm-up and Cool-down Performed on first and last piece of equipment    Resistance Training Performed Yes    VAD Patient? No    PAD/SET Patient? No      Pain Assessment   Currently in Pain? No/denies                Social History   Tobacco Use  Smoking Status Never  Smokeless Tobacco Never    Goals Met:  Independence with exercise equipment Exercise tolerated well No report of cardiac concerns or symptoms Strength training completed today  Goals Unmet:  Not Applicable  Comments: Pt able to follow exercise prescription today without complaint.  Will continue to monitor for progression.    Dr. Emily Filbert is Medical Director for [place].  Dr. Ottie Glazier is Medical Director for [facility] Pulmonary Rehabilitation.

## 2021-07-19 ENCOUNTER — Other Ambulatory Visit: Payer: Self-pay

## 2021-07-19 DIAGNOSIS — A318 Other mycobacterial infections: Secondary | ICD-10-CM

## 2021-07-19 DIAGNOSIS — J432 Centrilobular emphysema: Secondary | ICD-10-CM

## 2021-07-19 DIAGNOSIS — A319 Mycobacterial infection, unspecified: Secondary | ICD-10-CM

## 2021-07-19 NOTE — Progress Notes (Signed)
Daily Session Note  Patient Details  Name: Victoria Mendoza MRN: [id number] Date of Birth: [birth year] Referring Provider:   Flowsheet Row Pulmonary Rehab from 05/15/2021 in [facility] Cardiac and Pulmonary Rehab  Referring Provider Megan Salon       Encounter Date: 07/19/2021  Check In:  Session Check In - 07/19/21 1343       Check-In   Supervising physician immediately available to respond to emergencies See telemetry face sheet for immediately available ER MD    Location [facility]Cardiac & Pulmonary Rehab    Staff Present Birdie Sons, MPA, Nino Glow, MS, ASCM CEP, Exercise Physiologist;Joseph Tessie Fass, [place]    Virtual Visit No    Medication changes reported     No    Fall or balance concerns reported    No    Warm-up and Cool-down Performed on first and last piece of equipment    Resistance Training Performed Yes    VAD Patient? No    PAD/SET Patient? No      Pain Assessment   Currently in Pain? No/denies                Social History   Tobacco Use  Smoking Status Never  Smokeless Tobacco Never    Goals Met:  Independence with exercise equipment Exercise tolerated well No report of cardiac concerns or symptoms Strength training completed today  Goals Unmet:  Not Applicable  Comments: Pt able to follow exercise prescription today without complaint.  Will continue to monitor for progression.    Dr. Emily Filbert is Medical Director for [place].  Dr. Ottie Glazier is Medical Director for [facility] Pulmonary Rehabilitation.

## 2021-07-20 ENCOUNTER — Other Ambulatory Visit: Payer: Self-pay

## 2021-07-20 ENCOUNTER — Encounter: Payer: Self-pay | Admitting: Internal Medicine

## 2021-07-20 ENCOUNTER — Encounter: Payer: Medicare HMO | Admitting: *Deleted

## 2021-07-20 DIAGNOSIS — J432 Centrilobular emphysema: Secondary | ICD-10-CM | POA: Diagnosis not present

## 2021-07-20 DIAGNOSIS — A319 Mycobacterial infection, unspecified: Secondary | ICD-10-CM

## 2021-07-20 DIAGNOSIS — A318 Other mycobacterial infections: Secondary | ICD-10-CM

## 2021-07-20 NOTE — Progress Notes (Signed)
Daily Session Note  Patient Details  Name: Victoria Mendoza MRN: [id number] Date of Birth: 1956-10-26 Referring Provider:   Flowsheet Row Pulmonary Rehab from 05/15/2021 in [facility] Cardiac and Pulmonary Rehab  Referring Provider Megan Salon       Encounter Date: 07/20/2021  Check In:  Session Check In - 07/20/21 1325       Check-In   Supervising physician immediately available to respond to emergencies See telemetry face sheet for immediately available ER MD    Location [facility]Card[facility] & Pulmonary Rehab    Staff Present Renita Papa, RN BSN;Joseph [place], RCP,RRT,BSRT;Jessica [place], [place], RCEP, CCRP, CCET    Virtual Visit No    Medication changes reported     No    Fall or balance concerns reported    No    Warm-up and Cool-down Performed on first and last piece of equipment    Resistance Training Performed Yes    VAD Patient? No    PAD/SET Patient? No      Pain Assessment   Currently in Pain? No/denies                Social History   Tobacco Use  Smoking Status Never  Smokeless Tobacco Never    Goals Met:  Independence with exercise equipment Exercise tolerated well No report of concerns or symptoms today Strength training completed today  Goals Unmet:  Not Applicable  Comments: Pt able to follow exercise prescription today without complaint.  Will continue to monitor for progression.    Dr. Emily Filbert is Medical Director for [place].  Dr. Ottie Glazier is Medical Director for [facility] Pulmonary Rehabilitation.

## 2021-07-24 ENCOUNTER — Other Ambulatory Visit: Payer: Self-pay

## 2021-07-24 DIAGNOSIS — J432 Centrilobular emphysema: Secondary | ICD-10-CM | POA: Diagnosis not present

## 2021-07-24 DIAGNOSIS — A318 Other mycobacterial infections: Secondary | ICD-10-CM

## 2021-07-24 DIAGNOSIS — A319 Mycobacterial infection, unspecified: Secondary | ICD-10-CM

## 2021-07-24 NOTE — Progress Notes (Signed)
Daily Session Note  Patient Details  Name: Victoria Mendoza MRN: [id number] Date of Birth: 12-29-1955 Referring Provider:   Flowsheet Row Pulmonary Rehab from 05/15/2021 in [facility] Cardiac and Pulmonary Rehab  Referring Provider Megan Salon       Encounter Date: 07/24/2021  Check In:  Session Check In - 07/24/21 1350       Check-In   Supervising physician immediately available to respond to emergencies See telemetry face sheet for immediately available ER MD    Location [facility]-Cardiac & Pulmonary Rehab    Staff Present Birdie Sons, MPA, Nino Glow, MS, ASCM CEP, Exercise Physiologist;Joseph Tessie Fass, [place]    Virtual Visit No    Medication changes reported     No    Fall or balance concerns reported    No    Warm-up and Cool-down Performed on first and last piece of equipment    Resistance Training Performed Yes    VAD Patient? No    PAD/SET Patient? No      Pain Assessment   Currently in Pain? No/denies                Social History   Tobacco Use  Smoking Status Never  Smokeless Tobacco Never    Goals Met:  Independence with exercise equipment Exercise tolerated well No report of concerns or symptoms today Strength training completed today  Goals Unmet:  Not Applicable  Comments: Pt able to follow exercise prescription today without complaint.  Will continue to monitor for progression.    Dr. Emily Filbert is Medical Director for [place].  Dr. Ottie Glazier is Medical Director for [facility] Pulmonary Rehabilitation.

## 2021-07-26 ENCOUNTER — Other Ambulatory Visit: Payer: Self-pay

## 2021-07-26 ENCOUNTER — Ambulatory Visit (INDEPENDENT_AMBULATORY_CARE_PROVIDER_SITE_OTHER): Payer: Medicare HMO | Admitting: Internal Medicine

## 2021-07-26 DIAGNOSIS — J432 Centrilobular emphysema: Secondary | ICD-10-CM

## 2021-07-26 DIAGNOSIS — A319 Mycobacterial infection, unspecified: Secondary | ICD-10-CM

## 2021-07-26 DIAGNOSIS — A318 Other mycobacterial infections: Secondary | ICD-10-CM

## 2021-07-26 NOTE — Progress Notes (Signed)
Virtual Visit via Telephone Note  I connected with Victoria Mendoza on 07/26/21 at 10:15 AM EDT by telephone and verified that I am speaking with the correct person using two identifiers.  Location: Patient: Home Provider: [facility]   I discussed the limitations, risks, security and privacy concerns of performing an evaluation and management service by telephone and the availability of in person appointments. I also discussed with the patient that there may be a patient responsible charge related to this service. The patient expressed understanding and agreed to proceed.   History of Present Illness: I called and spoke with Victoria Mendoza today.  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  She has been on oral azithromycin, ethambutol and rifampin since May 2021.  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  She felt like she could not tolerate imipenem.  She was hospitalized briefly and discharged on IV tigecycline, amikacin and cefepime on 02/21/2021.  She has not had any obvious complications related to her antibiotics.   She started pulmonary rehab about 2 months ago.  She says that her oxygen saturation levels have been better when she is doing physical therapy but overall she says she remains very weak.     Sputum AFB smear and culture obtained on 03/28/2021 were negative.  Repeat sputum smear on 06/13/2021 was negative but cultures are growing AFB again.  She followed-up with Dr. Valentina Lucks at [facility] on 07/10/2021.  He cleared her for surgery.  She saw her thoracic surgeon, Dr. Ewing Schlein last week.  She is scheduled for thoracic surgery on 08/28/2021.   She recently had her PICC line replaced because of sluggish flow.      Observations/Objective: Creatinine 07/17/2021: 0.47  Assessment and Plan: She is doing a little better with pulmonary rehab.  Fortunately she is tolerating her complicated IV antibiotic regimen and has ready  for surgery in 1 month.  Dr. Valentina Lucks has requested surgical specimens for AFB stain and culture.  If they are negative he felt like the reader would only need a couple of months of postop IV antibiotic therapy.  Follow Up Instructions: Continue IV tigecycline, amikacin and cefoxitin I will arrange postop follow-up in mid October   I discussed the assessment and treatment plan with the patient. The patient was provided an opportunity to ask questions and all were answered. The patient agreed with the plan and demonstrated an understanding of the instructions.   The patient was advised to call back or seek an in-person evaluation if the symptoms worsen or if the condition fails to improve as anticipated.  I provided 18 minutes of non-face-to-face time during this encounter.   Cliffton Asters, MD

## 2021-07-26 NOTE — Progress Notes (Signed)
Daily Session Note  Patient Details  Name: Victoria Mendoza MRN: [id number] Date of Birth: [birth year] Referring Provider:   Flowsheet Row Pulmonary Rehab from 05/15/2021 in [facility] Cardiac and Pulmonary Rehab  Referring Provider Megan Salon       Encounter Date: 07/26/2021  Check In:  Session Check In - 07/26/21 1334       Check-In   Supervising physician immediately available to respond to emergencies See telemetry face sheet for immediately available ER MD    Location [facility]-Cardiac & Pulmonary Rehab    Staff Present Birdie Sons, MPA, RN;Joseph Lou [place], MS, ASCM CEP, Exercise Physiologist    Virtual Visit No    Medication changes reported     No    Fall or balance concerns reported    No    Warm-up and Cool-down Performed on first and last piece of equipment    Resistance Training Performed Yes    VAD Patient? No    PAD/SET Patient? No      Pain Assessment   Currently in Pain? No/denies                Social History   Tobacco Use  Smoking Status Never  Smokeless Tobacco Never    Goals Met:  Independence with exercise equipment Exercise tolerated well No report of concerns or symptoms today Strength training completed today  Goals Unmet:  Not Applicable  Comments: Pt able to follow exercise prescription today without complaint.  Will continue to monitor for progression.    Dr. Emily Filbert is Medical Director for [place].  Dr. Ottie Glazier is Medical Director for [facility] Pulmonary Rehabilitation.

## 2021-07-27 ENCOUNTER — Other Ambulatory Visit: Payer: Self-pay

## 2021-07-27 ENCOUNTER — Encounter: Payer: Medicare HMO | Attending: Internal Medicine | Admitting: *Deleted

## 2021-07-27 DIAGNOSIS — A319 Mycobacterial infection, unspecified: Secondary | ICD-10-CM | POA: Diagnosis present

## 2021-07-27 DIAGNOSIS — J432 Centrilobular emphysema: Secondary | ICD-10-CM | POA: Insufficient documentation

## 2021-07-27 NOTE — Progress Notes (Signed)
Daily Session Note  Patient Details  Name: Victoria Mendoza MRN: [id number] Date of Birth: 11/12/1956 Referring Provider:   Flowsheet Row Pulmonary Rehab from 05/15/2021 in [facility] Cardiac and Pulmonary Rehab  Referring Provider Megan Salon       Encounter Date: 07/27/2021  Check In:  Session Check In - 07/27/21 1326       Check-In   Supervising physician immediately available to respond to emergencies See telemetry face sheet for immediately available ER MD    Location [facility]-Cardiac & Pulmonary Rehab    Staff Present Renita Papa, RN BSN;Joseph Tessie Fass, RCP,RRT,BSRT;Melissa [place], [place], LDN    Virtual Visit No    Medication changes reported     No    Fall or balance concerns reported    No    Warm-up and Cool-down Performed on first and last piece of equipment    Resistance Training Performed Yes    VAD Patient? No    PAD/SET Patient? No      Pain Assessment   Currently in Pain? No/denies                Social History   Tobacco Use  Smoking Status Never  Smokeless Tobacco Never    Goals Met:  Independence with exercise equipment Exercise tolerated well No report of concerns or symptoms today Strength training completed today  Goals Unmet:  Not Applicable  Comments: Pt able to follow exercise prescription today without complaint.  Will continue to monitor for progression.    Dr. Emily Filbert is Medical Director for [place].  Dr. Ottie Glazier is Medical Director for [facility] Pulmonary Rehabilitation.

## 2021-07-28 ENCOUNTER — Telehealth: Payer: Self-pay | Admitting: Pharmacist

## 2021-07-28 ENCOUNTER — Telehealth: Payer: Self-pay

## 2021-07-28 NOTE — Telephone Encounter (Signed)
Victoria Mendoza called back, and I relayed Dr. Blair Dolphin order to continue IV antibiotics ( tigecycline, amikacin and cefoxitin) at a minimum through 10/17 which is 2 weeks after her surgery on 10/3.   [facility] will not be able to deliver additional medications until next Tuesday due to the holiday weekend. Victoria Mendoza stated he is not sure that Victoria Mendoza has enough IV antibiotics to last her through next Tuesday but that he would call her and ask.   Margarite Gouge, PharmD, CPP Clinical Pharmacist Practitioner Infectious Diseases Clinical Pharmacist [facility] for Infectious Disease

## 2021-07-28 NOTE — Telephone Encounter (Signed)
Sounds good - thanks you Aundra Millet!

## 2021-07-28 NOTE — Telephone Encounter (Signed)
[place], [facility] from [facility] called requesting extension on IV antibiotic orders. Will forward to pharmacy.   P: [phone]  Sandie Ano, RN

## 2021-07-28 NOTE — Telephone Encounter (Signed)
Called [place], Rph at [facility] [address] back and LVM. Will relay extended IV antibiotic orders when he returns call.  Thanks,  Margarite Gouge, PharmD, CPP Clinical Pharmacist Practitioner Infectious Diseases Clinical Pharmacist [facility] for Infectious Disease

## 2021-08-01 ENCOUNTER — Encounter: Payer: Self-pay | Admitting: Internal Medicine

## 2021-08-02 ENCOUNTER — Encounter: Payer: Self-pay | Admitting: *Deleted

## 2021-08-02 ENCOUNTER — Encounter: Payer: Medicare HMO | Admitting: *Deleted

## 2021-08-02 ENCOUNTER — Other Ambulatory Visit: Payer: Self-pay

## 2021-08-02 DIAGNOSIS — J432 Centrilobular emphysema: Secondary | ICD-10-CM

## 2021-08-02 DIAGNOSIS — A319 Mycobacterial infection, unspecified: Secondary | ICD-10-CM

## 2021-08-02 DIAGNOSIS — A318 Other mycobacterial infections: Secondary | ICD-10-CM

## 2021-08-02 NOTE — Progress Notes (Signed)
Daily Session Note  Patient Details  Name: Victoria Mendoza MRN: [id number] Date of Birth: 1956-06-20 Referring Provider:   Flowsheet Row Pulmonary Rehab from 05/15/2021 in [facility] Cardiac and Pulmonary Rehab  Referring Provider Megan Salon       Encounter Date: 08/02/2021  Check In:  Session Check In - 08/02/21 1434       Check-In   Supervising physician immediately available to respond to emergencies See telemetry face sheet for immediately available ER MD    Location [facility]-Cardiac & Pulmonary Rehab    Staff Present Nyoka Cowden, RN, BSN, Tyna Jaksch, MS, ASCM CEP, Exercise Physiologist;Jessica [place], MA, RCEP, CCRP, CCET;Joseph [place], [place]    Virtual Visit No    Medication changes reported     No    Fall or balance concerns reported    No    Tobacco Cessation No Change    Warm-up and Cool-down Performed on first and last piece of equipment    Resistance Training Performed No    VAD Patient? No    PAD/SET Patient? No      Pain Assessment   Currently in Pain? No/denies    Multiple Pain Sites No                Social History   Tobacco Use  Smoking Status Never  Smokeless Tobacco Never    Goals Met:  Independence with exercise equipment Exercise tolerated well No report of concerns or symptoms today  Goals Unmet:  Not Applicable  Comments: Pt able to follow exercise prescription today without complaint.  Will continue to monitor for progression.    Dr. Emily Filbert is Medical Director for [place].  Dr. Ottie Glazier is Medical Director for [facility] Pulmonary Rehabilitation.

## 2021-08-02 NOTE — Progress Notes (Signed)
Pulmonary Individual Treatment Plan  Patient Details  Name: Chantale Leugers MRN: [id number] Date of Birth: [birth year] Referring Provider:   Flowsheet Row Pulmonary Rehab from 05/15/2021 in [facility] Cardiac and Pulmonary Rehab  Referring Provider Orvan Falconer       Initial Encounter Date:  Flowsheet Row Pulmonary Rehab from 05/15/2021 in [facility] Cardiac and Pulmonary Rehab  Date 05/15/21       Visit Diagnosis: Centrilobular emphysema (HCC)  Mycobacterium abscessus infection  Patient's Home Medications on Admission:  Current Outpatient Medications:    albuterol (PROVENTIL) (2.5 MG/3ML) 0.083% nebulizer solution, Take 3 mLs (2.5 mg total) by nebulization every four hours as needed for wheezing or shortness of breath., Disp: 90 mL, Rfl: 12   albuterol (VENTOLIN HFA) 108 (90 Base) MCG/ACT inhaler, INHALE 2 PUFFS INTO THE LUNGS EVERY 6 HOURS AS NEEDED FOR WHEEZING OR SHORTNESS OF BREATH, Disp: 8.5 g, Rfl: 4   amikacin (AMIKIN) 500 MG/2ML SOLN injection, , Disp: , Rfl:    amoxicillin-clavulanate (AUGMENTIN) 875-125 MG tablet, , Disp: , Rfl:    azithromycin (ZITHROMAX) 250 MG tablet, Take 1 tablet (250 mg total) by mouth daily., Disp: 30 tablet, Rfl: 5   azithromycin (ZITHROMAX) 500 MG tablet, Take 500 mg by mouth daily., Disp: , Rfl:    B Complex Vitamins (VITAMIN B COMPLEX) TABS, Take 1 tablet by mouth daily., Disp: , Rfl:    hydrOXYzine (ATARAX/VISTARIL) 25 MG tablet, Take 1 tablet (25 mg total) by mouth as needed (one time dose for increased anxiety)., Disp: 30 tablet, Rfl: 0   megestrol (MEGACE) 400 MG/10ML suspension, Take 10 mLs (400 mg total) by mouth daily., Disp: 300 mL, Rfl: 5   Misc. Devices (PULSE OXIMETER) MISC, 1 Units by Does not apply route as needed., Disp: 1 each, Rfl: 0   Multiple Vitamin (MULTIVITAMIN WITH MINERALS) TABS tablet, Take 1 tablet by mouth daily., Disp: , Rfl:    Omega-3 Fatty Acids (OMEGA 3 PO), Take 4 capsules by mouth daily., Disp: , Rfl:    polyethylene glycol powder  (GLYCOLAX/MIRALAX) 17 GM/SCOOP powder, dissolve  1 capful in water and take by daily as needed for mild constipation., Disp: 510 g, Rfl: 0   polyvinyl alcohol (LIQUIFILM TEARS) 1.4 % ophthalmic solution, Place 1 drop into both eyes daily as needed for dry eyes., Disp: , Rfl:    Pyridoxine HCl (VITAMIN B-6 PO), Take 1 tablet by mouth daily., Disp: , Rfl:    tigecycline (TYGACIL) 50 MG injection, , Disp: , Rfl:    triamcinolone (KENALOG) 0.025 % ointment, Apply 1 application topically 2 (two) times daily., Disp: 30 g, Rfl: 0  Past Medical History: Past Medical History:  Diagnosis Date   Bronchiectasis (HCC)    Hypertension    Pulmonary TB 2018    Tobacco Use: Social History   Tobacco Use  Smoking Status Never  Smokeless Tobacco Never    Labs: Recent Review Flowsheet Data     Labs for ITP Cardiac and Pulmonary Rehab Latest Ref Rng & Units 03/12/2021   Hemoglobin A1c 4.8 - 5.6 % 6.3(H)        Pulmonary Assessment Scores:  Pulmonary Assessment Scores     Row Name 05/15/21 1645         ADL UCSD   SOB Score total 106     Rest 5     Walk 5     Stairs 5     Bath 4     Dress 4     Shop 5  CAT Score   CAT Score 29           mMRC Score   mMRC Score 4              UCSD: Self-administered rating of dyspnea associated with activities of daily living (ADLs) 6-point scale (0 = "not at all" to 5 = "maximal or unable to do because of breathlessness")  Scoring Scores range from 0 to 120.  Minimally important difference is 5 units  CAT: CAT can identify the health impairment of COPD patients and is better correlated with disease progression.  CAT has a scoring range of zero to 40. The CAT score is classified into four groups of low (less than 10), medium (10 - 20), high (21-30) and very high (31-40) based on the impact level of disease on health status. A CAT score over 10 suggests significant symptoms.  A worsening CAT score could be explained by an  exacerbation, poor medication adherence, poor inhaler technique, or progression of COPD or comorbid conditions.  CAT MCID is 2 points  mMRC: mMRC (Modified Medical Research Council) Dyspnea Scale is used to assess the degree of baseline functional disability in patients of respiratory disease due to dyspnea. No minimal important difference is established. A decrease in score of 1 point or greater is considered a positive change.   Pulmonary Function Assessment:   Exercise Target Goals: Exercise Program Goal: Individual exercise prescription set using results from initial 6 min walk test and THRR while considering  patient's activity barriers and safety.   Exercise Prescription Goal: Initial exercise prescription builds to 30-45 minutes a day of aerobic activity, 2-3 days per week.  Home exercise guidelines will be given to patient during program as part of exercise prescription that the participant will acknowledge.  Education: Aerobic Exercise: - Group verbal and visual presentation on the components of exercise prescription. Introduces F.I.T.T principle from ACSM for exercise prescriptions.  Reviews F.I.T.T. principles of aerobic exercise including progression. Written material given at graduation. Flowsheet Row Pulmonary Rehab from 07/19/2021 in [facility] Cardiac and Pulmonary Rehab  Date 07/19/21  Educator AS  Instruction Review Code 1- Verbalizes Understanding       Education: Resistance Exercise: - Group verbal and visual presentation on the components of exercise prescription. Introduces F.I.T.T principle from ACSM for exercise prescriptions  Reviews F.I.T.T. principles of resistance exercise including progression. Written material given at graduation. Flowsheet Row Pulmonary Rehab from 07/19/2021 in [facility] Cardiac and Pulmonary Rehab  Date 05/24/21  Educator KL  Instruction Review Code 1- Verbalizes Understanding        Education: Exercise & Equipment Safety: - Individual verbal  instruction and demonstration of equipment use and safety with use of the equipment. Flowsheet Row Pulmonary Rehab from 07/19/2021 in [facility] Cardiac and Pulmonary Rehab  Date 05/15/21  Educator AS  Instruction Review Code 1- Verbalizes Understanding       Education: Exercise Physiology & General Exercise Guidelines: - Group verbal and written instruction with models to review the exercise physiology of the cardiovascular system and associated critical values. Provides general exercise guidelines with specific guidelines to those with heart or lung disease.    Education: Flexibility, Balance, Mind/Body Relaxation: - Group verbal and visual presentation with interactive activity on the components of exercise prescription. Introduces F.I.T.T principle from ACSM for exercise prescriptions. Reviews F.I.T.T. principles of flexibility and balance exercise training including progression. Also discusses the mind body connection.  Reviews various relaxation techniques to help reduce and manage stress (i.e. Deep  breathing, progressive muscle relaxation, and visualization). Balance handout provided to take home. Written material given at graduation.   Activity Barriers & Risk Stratification:  Activity Barriers & Cardiac Risk Stratification - 05/05/21 1419       Activity Barriers & Cardiac Risk Stratification   Activity Barriers Muscular Weakness;Shortness of Breath   Leg weak            6 Minute Walk:  6 Minute Walk     Row Name 05/15/21 1637         6 Minute Walk   Distance 712 feet     Walk Time 4.5 minutes     # of Rest Breaks 2     MPH 1.8     METS 2.47     RPE 20     Perceived Dyspnea  4     VO2 Peak 8.64     Symptoms Yes (comment)     Comments SOB     Resting HR 96 bpm     Resting BP 106/60     Resting Oxygen Saturation  95 %     Exercise Oxygen Saturation  during 6 min walk 94 %     Max Ex. HR 114 bpm     Max Ex. BP 134/64     2 Minute Post BP 114/64            Interval HR   1 Minute HR 110     2 Minute HR 108     3 Minute HR 111     4 Minute HR 108     5 Minute HR 111     6 Minute HR 114     2 Minute Post HR 99     Interval Heart Rate? Yes           Interval Oxygen   Interval Oxygen? Yes     Baseline Oxygen Saturation % 95 %     1 Minute Oxygen Saturation % 95 %     2 Minute Oxygen Saturation % 93 %     3 Minute Oxygen Saturation % 95 %     4 Minute Oxygen Saturation % 94 %     5 Minute Oxygen Saturation % 96 %     6 Minute Oxygen Saturation % 94 %     2 Minute Post Oxygen Saturation % 96 %             Oxygen Initial Assessment:  Oxygen Initial Assessment - 05/05/21 1420       Home Oxygen   Home Oxygen Device None    Sleep Oxygen Prescription None    Home Exercise Oxygen Prescription None    Home Resting Oxygen Prescription None    Compliance with Home Oxygen Use Yes      Intervention   Short Term Goals To learn and understand importance of maintaining oxygen saturations>88%;To learn and demonstrate proper pursed lip breathing techniques or other breathing techniques. ;To learn and demonstrate proper use of respiratory medications    Long  Term Goals Exhibits compliance with exercise, home  and travel O2 prescription;Exhibits proper breathing techniques, such as pursed lip breathing or other method taught during program session;Compliance with respiratory medication;Demonstrates proper use of MDI's             Oxygen Re-Evaluation:  Oxygen Re-Evaluation     Row Name 05/22/21 1422 06/15/21 1344 07/26/21 1342         Program Oxygen Prescription   Program  Oxygen Prescription -- None None           Home Oxygen   Home Oxygen Device None None None     Sleep Oxygen Prescription None None None     Home Exercise Oxygen Prescription None None None     Home Resting Oxygen Prescription None None None     Compliance with Home Oxygen Use Yes Yes Yes           Goals/Expected Outcomes   Short Term Goals To learn and  understand importance of maintaining oxygen saturations>88%;To learn and demonstrate proper pursed lip breathing techniques or other breathing techniques.  To learn and demonstrate proper use of respiratory medications To learn and understand importance of maintaining oxygen saturations>88%;To learn and understand importance of monitoring SPO2 with pulse oximeter and demonstrate accurate use of the pulse oximeter.     Long  Term Goals Exhibits compliance with exercise, home  and travel O2 prescription;Exhibits proper breathing techniques, such as pursed lip breathing or other method taught during program session;Compliance with respiratory medication Demonstrates proper use of MDI's Maintenance of O2 saturations>88%;Verbalizes importance of monitoring SPO2 with pulse oximeter and return demonstration     Comments Reviewed PLB technique with pt.  Talked about how it works and it's importance in maintaining their exercise saturations. Informed Colandra how to use a spacer and when to take her Albuterol when she is short of breath. She understands how to use it but does not use it much because it makes her feel jittery. Informed her to talk to her doctor about her shortness of breath and wheezing. Her lung sounds often have alot of wheezing when she comes to rehab. She states she has not yet talked to her doctor about it. Danely has a pulse oximeter to check her oxygen saturation at home. Informed her why it is important to have one. Reviewed that oxygen saturations should be 88 percent and above. Patient has a pulse oximeter at home to check his oxygen and will continue to use.     Goals/Expected Outcomes Short: Become more profiecient at using PLB.   Long: Become independent at using PLB. Short: speak with her doctor about her breathing. Long: maintain taking medication as needed independently. Short: monitor oxygen at home with exertion. Long: maintain oxygen saturations above 88 percent independently.               Oxygen Discharge (Final Oxygen Re-Evaluation):  Oxygen Re-Evaluation - 07/26/21 1342       Program Oxygen Prescription   Program Oxygen Prescription None      Home Oxygen   Home Oxygen Device None    Sleep Oxygen Prescription None    Home Exercise Oxygen Prescription None    Home Resting Oxygen Prescription None    Compliance with Home Oxygen Use Yes      Goals/Expected Outcomes   Short Term Goals To learn and understand importance of maintaining oxygen saturations>88%;To learn and understand importance of monitoring SPO2 with pulse oximeter and demonstrate accurate use of the pulse oximeter.    Long  Term Goals Maintenance of O2 saturations>88%;Verbalizes importance of monitoring SPO2 with pulse oximeter and return demonstration    Comments Heela has a pulse oximeter to check her oxygen saturation at home. Informed her why it is important to have one. Reviewed that oxygen saturations should be 88 percent and above. Patient has a pulse oximeter at home to check his oxygen and will continue to use.    Goals/Expected Outcomes Short:  monitor oxygen at home with exertion. Long: maintain oxygen saturations above 88 percent independently.             Initial Exercise Prescription:  Initial Exercise Prescription - 05/15/21 1600       Date of Initial Exercise RX and Referring Provider   Date 05/15/21    Referring Provider Orvan Falconer      Treadmill   MPH 1.5    Grade 0    Minutes 15    METs 2      Recumbant Bike   Level 1    RPM 60    Minutes 15    METs 2      NuStep   Level 1    SPM 80    Minutes 15    METs 2      Biostep-RELP   Level 1    SPM 50    Minutes 15    METs 2      Prescription Details   Frequency (times per week) 3    Duration Progress to 30 minutes of continuous aerobic without signs/symptoms of physical distress      Intensity   THRR 40-80% of Max Heartrate 119-142    Ratings of Perceived Exertion 11-15    Perceived Dyspnea 0-4       Progression   Progression Continue to progress workloads to maintain intensity without signs/symptoms of physical distress.      Resistance Training   Training Prescription Yes    Weight 3 lb    Reps 10-15             Perform Capillary Blood Glucose checks as needed.  Exercise Prescription Changes:   Exercise Prescription Changes     Row Name 05/15/21 1600 05/23/21 0800 06/05/21 1500 06/19/21 1300 06/19/21 1400     Response to Exercise   Blood Pressure (Admit) 106/60 102/58 108/62 102/58 --   Blood Pressure (Exercise) 134/64 128/70 126/62 -- --   Blood Pressure (Exit) 114/64 95/58 100/64 102/64 --   Heart Rate (Admit) 96 bpm 100 bpm 82 bpm 94 bpm --   Heart Rate (Exercise) 114 bpm 124 bpm 121 bpm 118 bpm --   Heart Rate (Exit) 99 bpm 100 bpm 117 bpm 120 bpm --   Oxygen Saturation (Admit) 95 % 96 % 98 % 100 % --   Oxygen Saturation (Exercise) 93 % 93 % 94 % 94 % --   Oxygen Saturation (Exit) 96 % 96 % 96 % 95 % --   Rating of Perceived Exertion (Exercise) 20 15 14 15  --   Perceived Dyspnea (Exercise) 4 2 2 1  --   Symptoms SOB SOB SOB, fatigue SOB --   Comments -- first day -- -- --   Duration -- -- Progress to 30 minutes of  aerobic without signs/symptoms of physical distress Progress to 30 minutes of  aerobic without signs/symptoms of physical distress --   Intensity -- -- THRR unchanged THRR unchanged --     Progression   Progression -- Continue to progress workloads to maintain intensity without signs/symptoms of physical distress. Continue to progress workloads to maintain intensity without signs/symptoms of physical distress. Continue to progress workloads to maintain intensity without signs/symptoms of physical distress. --   Average METs -- 3.4 3.56 3.3 --     Resistance Training   Training Prescription -- Yes Yes Yes --   Weight -- 3 lb 3 lb 3 lb --   Reps -- 10-15 10-15 10-15 --  Interval Training   Interval Training -- No No No --     Treadmill   MPH --  1.5 2 2.1 --   Grade -- 0 0.5 0.5 --   Minutes -- 15 15 15  --   METs -- 2.15 2.67 2.75 --     NuStep   Level -- -- 1 -- --   Minutes -- -- 15 -- --   METs -- -- 4.5 -- --     REL-XR   Level -- 1 1 2  --   Minutes -- 15 15 15  --   METs -- 4.7 3.5 3.8 --     Home Exercise Plan   Plans to continue exercise at -- -- -- -- Home (comment)  Treadmill, weights   Frequency -- -- -- -- Add 2 additional days to program exercise sessions.   Initial Home Exercises Provided -- -- -- -- 06/19/21    Row Name 07/03/21 1200 07/17/21 1000 08/01/21 1600         Response to Exercise   Blood Pressure (Admit) 124/68 110/60 108/62     Blood Pressure (Exit) 106/64 108/64 102/54     Heart Rate (Admit) 105 bpm 109 bpm 113 bpm     Heart Rate (Exercise) 128 bpm 118 bpm 125 bpm     Heart Rate (Exit) 124 bpm 112 bpm 112 bpm     Oxygen Saturation (Admit) 94 % 94 % 95 %     Oxygen Saturation (Exercise) 95 % 94 % 90 %     Oxygen Saturation (Exit) 96 % 95 % 96 %     Rating of Perceived Exertion (Exercise) 15 13 15      Perceived Dyspnea (Exercise) 3 3 3      Symptoms SOB SOB SOB, fatigue     Duration Continue with 30 min of aerobic exercise without signs/symptoms of physical distress. Continue with 30 min of aerobic exercise without signs/symptoms of physical distress. Continue with 30 min of aerobic exercise without signs/symptoms of physical distress.     Intensity THRR unchanged THRR unchanged THRR unchanged           Progression   Progression Continue to progress workloads to maintain intensity without signs/symptoms of physical distress. Continue to progress workloads to maintain intensity without signs/symptoms of physical distress. Continue to progress workloads to maintain intensity without signs/symptoms of physical distress.     Average METs 3.1 3.1 3.05           Resistance Training   Training Prescription Yes Yes Yes     Weight 4 lb 4 lb 4 lb     Reps 10-15 10-15 10-15           Interval  Training   Interval Training No No No           Treadmill   MPH 2.3 1.9 1.9     Grade 3 0 0     Minutes 15 15 15      METs 3.71 2.45 2.45           Recumbant Bike   Level 2 -- --     Minutes 15 -- --           NuStep   Level -- -- 3     Minutes -- -- 15     METs -- -- 2.7           REL-XR   Level 4 -- 4     Minutes 15 -- 15  METs 2.5 -- 4           Home Exercise Plan   Plans to continue exercise at Home (comment)  Treadmill, weights Home (comment)  Treadmill, weights Home (comment)  Treadmill, weights     Frequency Add 2 additional days to program exercise sessions. Add 2 additional days to program exercise sessions. Add 2 additional days to program exercise sessions.     Initial Home Exercises Provided 06/19/21 06/19/21 06/19/21              Exercise Comments:   Exercise Comments     Row Name 05/22/21 1421           Exercise Comments First full day of exercise!  Patient was oriented to gym and equipment including functions, settings, policies, and procedures.  Patient's individual exercise prescription and treatment plan were reviewed.  All starting workloads were established based on the results of the 6 minute walk test done at initial orientation visit.  The plan for exercise progression was also introduced and progression will be customized based on patient's performance and goals.                Exercise Goals and Review:   Exercise Goals     Row Name 05/15/21 1642             Exercise Goals   Increase Physical Activity Yes       Intervention Provide advice, education, support and counseling about physical activity/exercise needs.;Develop an individualized exercise prescription for aerobic and resistive training based on initial evaluation findings, risk stratification, comorbidities and participant's personal goals.       Expected Outcomes Short Term: Attend rehab on a regular basis to increase amount of physical activity.;Long Term: Add  in home exercise to make exercise part of routine and to increase amount of physical activity.;Long Term: Exercising regularly at least 3-5 days a week.       Increase Strength and Stamina Yes       Intervention Provide advice, education, support and counseling about physical activity/exercise needs.;Develop an individualized exercise prescription for aerobic and resistive training based on initial evaluation findings, risk stratification, comorbidities and participant's personal goals.       Expected Outcomes Short Term: Increase workloads from initial exercise prescription for resistance, speed, and METs.;Long Term: Improve cardiorespiratory fitness, muscular endurance and strength as measured by increased METs and functional capacity ( )       Able to understand and use rate of perceived exertion (RPE) scale Yes       Intervention Provide education and explanation on how to use RPE scale       Expected Outcomes Short Term: Able to use RPE daily in rehab to express subjective intensity level;Long Term:  Able to use RPE to guide intensity level when exercising independently       Able to understand and use Dyspnea scale Yes       Intervention Provide education and explanation on how to use Dyspnea scale       Expected Outcomes Short Term: Able to use Dyspnea scale daily in rehab to express subjective sense of shortness of breath during exertion;Long Term: Able to use Dyspnea scale to guide intensity level when exercising independently       Knowledge and understanding of Target Heart Rate Range (THRR) Yes       Intervention Provide education and explanation of THRR including how the numbers were predicted and where they are located for reference  Expected Outcomes Short Term: Able to state/look up THRR;Short Term: Able to use daily as guideline for intensity in rehab;Long Term: Able to use THRR to govern intensity when exercising independently       Able to check pulse independently Yes        Intervention Provide education and demonstration on how to check pulse in carotid and radial arteries.;Review the importance of being able to check your own pulse for safety during independent exercise       Expected Outcomes Short Term: Able to explain why pulse checking is important during independent exercise;Long Term: Able to check pulse independently and accurately       Understanding of Exercise Prescription Yes       Intervention Provide education, explanation, and written materials on patient's individual exercise prescription       Expected Outcomes Short Term: Able to explain program exercise prescription;Long Term: Able to explain home exercise prescription to exercise independently                Exercise Goals Re-Evaluation :  Exercise Goals Re-Evaluation     Row Name 05/22/21 1421 06/05/21 1553 06/15/21 1351 06/19/21 1310 06/19/21 1416     Exercise Goal Re-Evaluation   Exercise Goals Review Increase Physical Activity;Able to understand and use rate of perceived exertion (RPE) scale;Knowledge and understanding of Target Heart Rate Range (THRR);Understanding of Exercise Prescription;Increase Strength and Stamina;Able to understand and use Dyspnea scale;Able to check pulse independently Increase Physical Activity;Increase Strength and Stamina;Understanding of Exercise Prescription Increase Strength and Stamina;Increase Physical Activity Increase Physical Activity;Increase Strength and Stamina Increase Physical Activity;Increase Strength and Stamina   Comments Reviewed RPE and dyspnea scales, THR and program prescription with pt today.  Pt voiced understanding and was given a copy of goals to take home. Rowen has completed 6 full days of exercise.  She continues to have difficulty with leg weakness and is worried that she is over doing it when he legs begin to hurt and feel tired.  We have encouraged rest breaks and tried a variety of equipment options to help her ability to exericse.   We will conitnue to monitor her progress. Jakerria has a bike and a treadmill at home. She stretches at home and does alot of balance exercise. She is still concerned with her leg weakness. 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 Dominica does feel weak a lot with exercise.  Staff will review home exercise and encourage doing some squats at home to help with lower body strength. Reviewed home exercise with pt today.  Pt plans to walk and do weights for exercise. Patient uses handweights at home, went over structure on resistance training, encouraged to complete 2 sets if can. Patient is discouraged and doesn't feel that she is progressing.  Reviewed THR, pulse, RPE, sign and symptoms, pulse oximetery and when to call 911 or MD.  Also discussed weather considerations and indoor options.  Pt voiced understanding.   Expected Outcomes Short: Use RPE daily to regulate intensity. Long: Follow program prescription in [facility]. Short: Continue to attend rehab regularly Long: Continue to build stamina in legs Short: maintain exercise in [facility]. Long: maintain exercise at home independently. Short: review home exercise with EP Long:  build lower body strength Short: Continue to check HR and O2 when exercising Long: Continue to exercise independently at appropriate prescription    Row Name 07/03/21 1156 07/17/21 1010 08/01/21 1624  Exercise Goal Re-Evaluation   Exercise Goals Review Increase Physical Activity;Increase Strength and Stamina;Understanding of Exercise Prescription Increase Physical Activity;Increase Strength and Stamina Increase Physical Activity;Increase Strength and Stamina;Understanding of Exercise Prescription     Comments Dorea is doing well in rehab.  She is on level 4 on the XR. We will continue to monitor her progress. Chanya continues to do well.  Oxygen stays in 90s.  Staff will encourage to try 4 lb for strength work Karmina is  doing well in rehab.  She is already nearing graduation and we expect to see an improvement in her post .  She is up to 1.9 mph on the treadmill.  We will continue to monitor her progress.     Expected Outcomes Short: Increase workload on bike Long: Continue to improve stamina Short: increase to 4 lb Long: build overall stamina short:Improve post Long: Continue to improve stamina.              Discharge Exercise Prescription (Final Exercise Prescription Changes):  Exercise Prescription Changes - 08/01/21 1600       Response to Exercise   Blood Pressure (Admit) 108/62    Blood Pressure (Exit) 102/54    Heart Rate (Admit) 113 bpm    Heart Rate (Exercise) 125 bpm    Heart Rate (Exit) 112 bpm    Oxygen Saturation (Admit) 95 %    Oxygen Saturation (Exercise) 90 %    Oxygen Saturation (Exit) 96 %    Rating of Perceived Exertion (Exercise) 15    Perceived Dyspnea (Exercise) 3    Symptoms SOB, fatigue    Duration Continue with 30 min of aerobic exercise without signs/symptoms of physical distress.    Intensity THRR unchanged      Progression   Progression Continue to progress workloads to maintain intensity without signs/symptoms of physical distress.    Average METs 3.05      Resistance Training   Training Prescription Yes    Weight 4 lb    Reps 10-15      Interval Training   Interval Training No      Treadmill   MPH 1.9    Grade 0    Minutes 15    METs 2.45      NuStep   Level 3    Minutes 15    METs 2.7      REL-XR   Level 4    Minutes 15    METs 4      Home Exercise Plan   Plans to continue exercise at Home (comment)   Treadmill, weights   Frequency Add 2 additional days to program exercise sessions.    Initial Home Exercises Provided 06/19/21             Nutrition:  Target Goals: Understanding of nutrition guidelines, daily intake of sodium 1500mg , cholesterol 200mg , calories 30% from fat and 7% or less from saturated fats, daily to have 5  or more servings of fruits and vegetables.  Education: All About Nutrition: -Group instruction provided by verbal, written material, interactive activities, discussions, models, and posters to present general guidelines for heart healthy nutrition including fat, fiber, MyPlate, the role of sodium in heart healthy nutrition, utilization of the nutrition label, and utilization of this knowledge for meal planning. Follow up email sent as well. Written material given at graduation. Flowsheet Row Pulmonary Rehab from 07/19/2021 in [facility] Cardiac and Pulmonary Rehab  Date 06/07/21  Educator [facility]  Instruction Review Code 1- [employer]  Biometrics:  Pre Biometrics - 05/15/21 1643       Pre Biometrics   Height 5\' 1"  (1.549 m)    Weight 109 lb (49.4 kg)    BMI (Calculated) 20.61    Single Leg Stand 30 seconds              Nutrition Therapy Plan and Nutrition Goals:  Nutrition Therapy & Goals - 06/13/21 1008       Nutrition Therapy   Diet Heart healthy, low Na, Pulmonary MNT    Protein (specify units) 60-65g    Fiber 25 grams    Whole Grain Foods 3 servings    Saturated Fats 12 max. grams    Fruits and Vegetables 8 servings/day    Sodium 1.5 grams      Personal Nutrition Goals   Nutrition Goal ST: continue to add fat to meals when possible, try for more frequent meals, drink in between meals LT: gain weight    Comments Niyati reports having lower appetite - she feels more short of breath after eating. 3 meals per day with fruit in between meals. She has someone who helps her. B: eggs and tortillas and tomatoes and coffee - black. She uses olive oil. L: soup or vegetables and protein and rice, she enjoys salads in addiiton to whatever lunch is S: fruit or mozarella cheese D: this is usually her larger. Protein with vegetables and carboydrates. She reports her weight has been steady, but she would like to gain some weight. She will also make smoothies with berries and  berries and protein powder- suggested adding a fat to this to help increase calories. She rpeorts eating avocado a lot with her meals. Yulianna reports being a 06/15/21 before and having a nutritional background. Reviewed general healthy eating and discussed pulmonary MNT - small fequent meals, eating protein foods first, adding fat to whatever she is eating, drinking in between meals.      Intervention Plan   Intervention Prescribe, educate and counsel regarding individualized specific dietary modifications aiming towards targeted core components such as weight, hypertension, lipid management, diabetes, heart failure and other comorbidities.;Nutrition handout(s) given to patient.    Expected Outcomes Short Term Goal: Understand basic principles of dietary content, such as calories, fat, sodium, cholesterol and nutrients.;Short Term Goal: A plan has been developed with personal nutrition goals set during dietitian appointment.;Long Term Goal: Adherence to prescribed nutrition plan.             Nutrition Assessments:  MEDIFICTS Score Key: ?70 Need to make dietary changes  40-70 Heart Healthy Diet ? 40 Therapeutic Level Cholesterol Diet  Flowsheet Row Pulmonary Rehab from 05/15/2021 in [facility] Cardiac and Pulmonary Rehab  Picture Your Plate Total Score on Admission 62      Picture Your Plate Scores: [facility] Unhealthy dietary pattern with much room for improvement. 41-50 Dietary pattern unlikely to meet recommendations for good health and room for improvement. 51-60 More healthful dietary pattern, with some room for improvement.  >60 Healthy dietary pattern, although there may be some specific behaviors that could be improved.   Nutrition Goals Re-Evaluation:  Nutrition Goals Re-Evaluation     Row Name 07/26/21 1339             Goals   Current Weight 104 lb (47.2 kg)       Nutrition Goal Weight gain.       Comment Kristan is trying to eat more food and get more protien to help her  gain  weight.       Expected Outcome Short: gain more weight. Long: maintain weight gain.                Nutrition Goals Discharge (Final Nutrition Goals Re-Evaluation):  Nutrition Goals Re-Evaluation - 07/26/21 1339       Goals   Current Weight 104 lb (47.2 kg)    Nutrition Goal Weight gain.    Comment Maloni is trying to eat more food and get more protien to help her gain weight.    Expected Outcome Short: gain more weight. Long: maintain weight gain.             Psychosocial: Target Goals: Acknowledge presence or absence of significant depression and/or stress, maximize coping skills, provide positive support system. Participant is able to verbalize types and ability to use techniques and skills needed for reducing stress and depression.   Education: Stress, Anxiety, and Depression - Group verbal and visual presentation to define topics covered.  Reviews how body is impacted by stress, anxiety, and depression.  Also discusses healthy ways to reduce stress and to treat/manage anxiety and depression.  Written material given at graduation.   Education: Sleep Hygiene -Provides group verbal and written instruction about how sleep can affect your health.  Define sleep hygiene, discuss sleep cycles and impact of sleep habits. Review good sleep hygiene tips.    Initial Review & Psychosocial Screening:  Initial Psych Review & Screening - 05/05/21 1422       Initial Review   Current issues with Current Stress Concerns    Source of Stress Concerns Chronic Illness    Comments Sometimes feelings of desperation about my illness.      Family Dynamics   Good Support System? Yes   2 sisters, 40 min and 1 hour away. Both come weekly to help.     Barriers   Psychosocial barriers to participate in program There are no identifiable barriers or psychosocial needs.      Screening Interventions   Interventions Encouraged to exercise    Expected Outcomes Short Term goal: Utilizing  psychosocial counselor, staff and physician to assist with identification of specific Stressors or current issues interfering with healing process. Setting desired goal for each stressor or current issue identified.;Long Term Goal: Stressors or current issues are controlled or eliminated.;Short Term goal: Identification and review with participant of any Quality of Life or Depression concerns found by scoring the questionnaire.;Long Term goal: The participant improves quality of Life and PHQ9 Scores as seen by post scores and/or verbalization of changes             Quality of Life Scores:  Scores of 19 and below usually indicate a poorer quality of life in these areas.  A difference of  2-3 points is a clinically meaningful difference.  A difference of 2-3 points in the total score of the Quality of Life Index has been associated with significant improvement in overall quality of life, self-image, physical symptoms, and general health in studies assessing change in quality of life.  PHQ-9: Recent Review Flowsheet Data     Depression screen [facility] 2/9 07/26/2021 07/13/2021 06/12/2021 05/15/2021 05/03/2021   Decreased Interest 0 3 3  3  0   Down, Depressed, Hopeless 0 0 1 2 0   PHQ - 2 Score 0 3 4 5  0   Altered sleeping - 1 2 0 -   Tired, decreased energy - 3 3 3  -   Change in appetite - 2 1  2 -   Feeling bad or failure about yourself  - 0 0 0 -   Trouble concentrating - 0 0 0 -   Moving slowly or fidgety/restless - 0 0 0 -   Suicidal thoughts - 0 0 0 -   PHQ-9 Score - 9 10 10  -   Difficult doing work/chores - Very difficult Very difficult - -      Interpretation of Total Score  Total Score Depression Severity:  1-4 = Minimal depression, 5-9 = Mild depression, 10-14 = Moderate depression, 15-19 = Moderately severe depression, 20-27 = Severe depression   Psychosocial Evaluation and Intervention:  Psychosocial Evaluation - 05/05/21 1441       Psychosocial Evaluation & Interventions    Interventions Encouraged to exercise with the program and follow exercise prescription    Comments Jasenia has no barriers to attending the program. She lives alone. She has 2 sisters that live an hour or less away that are at her home weekly or more to help her. She wants to attend and see if she can improve her shortness of breath symptoms and improove her activitiy levels.  She states she does not have depression, but does some time feel a desperation over her chronic illness. She is limited in her activities because of her shortness of breath. SHe does listen to music and watch some TV to keep herslf occupied.   She should do well in the program as she is ready to work on doing what she can to improve her activity levels. She is already using a treadmill at home for short sessions.    Expected Outcomes STG: Savanah attends all scheduled sessions. She is able to progress with her exercise levels and gain staminaand decreased shortness of breath. LTG: Skylah continues with her progress to enable her to be more active longer periods of time.    Continue Psychosocial Services  Follow up required by staff             Psychosocial Re-Evaluation:  Psychosocial Re-Evaluation     Row Name 06/12/21 1420 07/13/21 1352           Psychosocial Re-Evaluation   Current issues with -- Current Stress Concerns      Comments Reviewed patient health questionnaire (PHQ-9) with patient for follow up. Previously, patients score indicated signs/symptoms of depression.  Reviewed to see if patient is improving symptom wise while in program.  Score has stayed the same and it is due to her breathing and her physical limitations. Reviewed patient health questionnaire (PHQ-9) with patient for follow up. Previously, patients score indicated signs/symptoms of depression.  Reviewed to see if patient is improving symptom wise while in program.  Score improved and patient states that it is because she has been able to exercise.  She states that due to her shortness of breath and fatigue is why her scores are reflected.      Expected Outcomes Short: Continue to attend [facility] regularly for regular exercise and social engagement. Long: Continue to improve symptoms and manage a positive mental state. Short: Continue to attend [facility] regularly for regular exercise and social engagement. Long: Continue to improve symptoms and manage a positive mental state.      Interventions Encouraged to attend Pulmonary Rehabilitation for the exercise Encouraged to attend Pulmonary Rehabilitation for the exercise      Continue Psychosocial Services  Follow up required by staff Follow up required by staff  Psychosocial Discharge (Final Psychosocial Re-Evaluation):  Psychosocial Re-Evaluation - 07/13/21 1352       Psychosocial Re-Evaluation   Current issues with Current Stress Concerns    Comments Reviewed patient health questionnaire (PHQ-9) with patient for follow up. Previously, patients score indicated signs/symptoms of depression.  Reviewed to see if patient is improving symptom wise while in program.  Score improved and patient states that it is because she has been able to exercise. She states that due to her shortness of breath and fatigue is why her scores are reflected.    Expected Outcomes Short: Continue to attend [facility] regularly for regular exercise and social engagement. Long: Continue to improve symptoms and manage a positive mental state.    Interventions Encouraged to attend Pulmonary Rehabilitation for the exercise    Continue Psychosocial Services  Follow up required by staff             Education: Education Goals: Education classes will be provided on a weekly basis, covering required topics. Participant will state understanding/return demonstration of topics presented.  Learning Barriers/Preferences:  Learning Barriers/Preferences - 05/05/21 1430       Learning  Barriers/Preferences   Learning Barriers None    Learning Preferences None             General Pulmonary Education Topics:  Infection Prevention: - Provides verbal and written material to individual with discussion of infection control including proper hand washing and proper equipment cleaning during exercise session. Flowsheet [facility]  Date 05/15/21  Educator AS  Instruction Review Code 1- Verbalizes Understanding       Falls Prevention: - Provides verbal and written material to individual with discussion of falls prevention and safety. Flowsheet Row [facility]monary [facility]  Date 05/15/21  Educator AS  Instruction Review Code 1- Verbalizes Understanding       Chronic Lung Disease Review: - Group verbal instruction with posters, models, PowerPoint presentations and videos,  to review new updates, new respiratory medications, new advancements in procedures and treatments. Providing information on websites and "800" numbers for continued self-education. Includes information about supplement oxygen, available portable oxygen systems, continuous and intermittent flow rates, oxygen safety, concentrators, and Medicare reimbursement for oxygen. Explanation of Pulmonary Drugs, including class, frequency, complications, importance of spacers, rinsing mouth after steroid MDI's, and proper cleaning methods for nebulizers. Review of basic lung anatomy and physiology related to function, structure, and complications of lung disease. Review of risk factors. Discussion about methods for diagnosing sleep apnea and types of masks and machines for OSA. Includes a review of the use of types of environmental controls: home humidity, furnaces, filters, dust mite/pet prevention, HEPA vacuums. Discussion about weather changes, air quality and the benefits of nasal washing. Instruction on Warning  signs, infection symptoms, calling MD promptly, preventive modes, and value of vaccinations. Review of effective airway clearance, coughing and/or vibration techniques. Emphasizing that all should Create an Action Plan. Written material given at graduation. Flowsheet Row [facility]monary [facility]  Date 06/28/21  [facility]  Instruction Review Code 1- Verbalizes Understanding       AED/CPR: - Group verbal and written instruction with the use of models to demonstrate the basic use of the AED with the basic ABC's of resuscitation.    Anatomy and Cardiac Procedures: - Group verbal and visual presentation and models provide information about basic cardiac anatomy and function.  Reviews the testing methods done to diagnose heart disease and the outcomes of the test results. Describes the treatment choices: Medical Management, Angioplasty, or Coronary Bypass Surgery for treating various heart conditions including Myocardial Infarction, Angina, Valve Disease, and Cardiac Arrhythmias.  Written material given at graduation. Flowsheet Row Pulmonary Rehab from 07/19/2021 in [facility] Cardiac and Pulmonary Rehab  Date 05/24/21  Educator [facility]  Instruction Review Code 1- Verbalizes Understanding       Medication Safety: - Group verbal and visual instruction to review commonly prescribed medications for heart and lung disease. Reviews the medication, class of the drug, and side effects. Includes the steps to properly store meds and maintain the prescription regimen.  Written material given at graduation.   Other: -Provides group and verbal instruction on various topics (see comments)   Knowledge Questionnaire Score:  Knowledge Questionnaire Score - 05/15/21 1647       Knowledge Questionnaire Score   Pre Score 4/18              Core Components/Risk Factors/Patient Goals at Admission:  Personal Goals and Risk Factors at Admission - 05/15/21 1644        Core Components/Risk Factors/Patient Goals on Admission    Weight Management Yes;Weight Gain    Intervention Weight Management: Develop a combined nutrition and exercise program designed to reach desired caloric intake, while maintaining appropriate intake of nutrient and fiber, sodium and fats, and appropriate energy expenditure required for the weight goal.;Weight Management: Provide education and appropriate resources to help participant work on and attain dietary goals.    Admit Weight 108 lb (49 kg)    Goal Weight: Short Term 115 lb (52.2 kg)    Goal Weight: Long Term 115 lb (52.2 kg)    Expected Outcomes Short Term: Continue to assess and modify interventions until short term weight is achieved;Long Term: Adherence to nutrition and physical activity/exercise program aimed toward attainment of established weight goal;Weight Gain: Understanding of general recommendations for a high calorie, high protein meal plan that promotes weight gain by distributing calorie intake throughout the day with the consumption for 4-5 meals, snacks, and/or supplements    Intervention Provide education, individualized exercise plan and daily activity instruction to help decrease symptoms of SOB with activities of daily living.    Expected Outcomes Short Term: Improve cardiorespiratory fitness to achieve a reduction of symptoms when performing ADLs;Long Term: Be able to perform more ADLs without symptoms or delay the onset of symptoms    Increase knowledge of respiratory medications and ability to use respiratory devices properly  Yes    Intervention Provide education and demonstration as needed of appropriate use of medications, inhalers, and oxygen therapy.    Expected Outcomes Short Term: Achieves understanding of medications use. Understands that oxygen is a medication prescribed by physician. Demonstrates appropriate use of inhaler and oxygen therapy.;Long Term: Maintain appropriate use of medications, inhalers, and  oxygen therapy.             Education:Diabetes - Individual verbal and written instruction to review signs/symptoms of diabetes, desired ranges of glucose level fasting, after meals and with exercise. Acknowledge that pre and post exercise glucose checks will be done for 3 sessions at entry of program.   Know Your Numbers and Heart Failure: - Group verbal and visual instruction to discuss disease risk factors for cardiac and pulmonary disease and treatment options.  Reviews associated critical values for Overweight/Obesity, Hypertension, Cholesterol, and Diabetes.  Discusses basics of heart failure: signs/symptoms and treatments.  Introduces Heart Failure Zone chart for action plan for heart failure.  Written material given at graduation. Flowsheet Row Pulmonary Rehab from 07/19/2021 in [facility] Cardiac and Pulmonary Rehab  Date 06/21/21  Educator [facility]  Instruction Review Code 1- Verbalizes Understanding       Core Components/Risk Factors/Patient Goals Review:   Goals and Risk Factor Review     Row Name 06/15/21 1349 07/26/21 1345           Core Components/Risk Factors/Patient Goals Review   Personal Goals Review Improve shortness of breath with ADL's Weight Management/Obesity;Other      Review Spoke to patient about their shortness of breath and what they can do to improve. Patient has been informed of breathing techniques when starting the program. Patient is informed to tell staff if they have had any med changes and that certain meds they are taking or not taking can be causing shortness of breath. Jency is tryinh to gain weight. She is having lung surgery October third. She will finish the program and be as strong as she can for surgery.      Expected Outcomes Short: Attend [facility] regularly to improve shortness of breath with ADL's. Long: maintain independence with ADL's Short: Have Lung Surgery. Long: improve shortness of breath.               Core Components/Risk  Factors/Patient Goals at Discharge (Final Review):   Goals and Risk Factor Review - 07/26/21 1345       Core Components/Risk Factors/Patient Goals Review   Personal Goals Review Weight Management/Obesity;Other    Review Shavonta is tryinh to gain weight. She is having lung surgery October third. She will finish the program and be as strong as she can for surgery.    Expected Outcomes Short: Have Lung Surgery. Long: improve shortness of breath.             ITP Comments:  ITP Comments     Row Name 05/05/21 1447 05/22/21 1421 06/07/21 0551 06/13/21 1027 07/05/21 0635   ITP Comments Virtual orientation call completed today. shehas an appointment on Date: 05/15/2021  for EP eval and gym Orientation.  Documentation of diagnosis [facility] found in [facility]  Date: 04/26/2021 and 03/28/2021 . First full day of exercise!  Patient was oriented to gym and equipment including functions, settings, policies, and procedures.  Patient's individual exercise prescription and treatment plan were reviewed.  All starting workloads were established based on the results of the 6 minute walk test done at initial orientation visit.  The plan for exercise progression was also introduced and progression will be customized based on patient's performance and goals. 30 Day review completed. Medical Director ITP review done, changes made as directed, and signed approval by Medical Director.     New to program Completed initial RD consultation 30 Day review completed. Medical Director ITP review done, changes made as directed, and signed approval by Medical Director.    Row Name 08/02/21 0551           ITP Comments 30 Day review completed. Medical Director ITP review done, changes made as directed, and signed approval by Medical Director.                Comments:

## 2021-08-03 ENCOUNTER — Telehealth: Payer: Self-pay

## 2021-08-03 ENCOUNTER — Encounter: Payer: Medicare HMO | Admitting: *Deleted

## 2021-08-03 ENCOUNTER — Other Ambulatory Visit: Payer: Self-pay | Admitting: Pharmacist

## 2021-08-03 DIAGNOSIS — J432 Centrilobular emphysema: Secondary | ICD-10-CM | POA: Diagnosis not present

## 2021-08-03 DIAGNOSIS — A318 Other mycobacterial infections: Secondary | ICD-10-CM

## 2021-08-03 DIAGNOSIS — A319 Mycobacterial infection, unspecified: Secondary | ICD-10-CM

## 2021-08-03 NOTE — Telephone Encounter (Signed)
Called patient to update on plan to [facility] with [facility] for labs and picc line dressing change. Patient state that she is not able to go to [facility] since it is too far from her. Would prefer to have labs and picc maintenance done by our office.  Will schedule picc dressing change and labs for tomorrow in the morning.  Patient has been updated with plan. Will schedule next dressing change for the following Friday. Pharmacy will enter lab orders.  Lab orders: Weekly CBC/D Bi Weekly Amikacin trough and BMP ESR CRP every other week.  Leatrice Jewels, RMA

## 2021-08-03 NOTE — Telephone Encounter (Addendum)
Per MD called patient's pulmonary Rehab to see if they would be able to manage patient's piccc line and draw labs. Spoke with Shanda Bumps who states they will not able to do this due to risk of exposure to her and other patient's. Called [facility] to see if they are still able to provide antibiotics. Per [facility] they are still able to provide antibiotics without any issues. Patient will need to be homebound in order for [facility] to provide nursing per Medicare policy.  [name], RMA

## 2021-08-03 NOTE — Progress Notes (Signed)
Daily Session Note  Patient Details  Name: Victoria Mendoza MRN: [id number] Date of Birth: 10-Feb-1956 Referring Provider:   Flowsheet Row Pulmonary Rehab from 05/15/2021 in [facility] Cardiac and Pulmonary Rehab  Referring Provider Megan Salon       Encounter Date: 08/03/2021  Check In:  Session Check In - 08/03/21 1335       Check-In   Supervising physician immediately available to respond to emergencies See telemetry face sheet for immediately available ER MD    Location [facility]-Cardiac & Pulmonary Rehab    Staff Present Renita Papa, RN BSN;Joseph [place], RCP,RRT,BSRT;Jessica [place], [place], RCEP, CCRP, CCET    Virtual Visit No    Medication changes reported     No    Fall or balance concerns reported    No    Warm-up and Cool-down Performed on first and last piece of equipment    Resistance Training Performed Yes    VAD Patient? No    PAD/SET Patient? No      Pain Assessment   Currently in Pain? No/denies                Social History   Tobacco Use  Smoking Status Never  Smokeless Tobacco Never    Goals Met:  Independence with exercise equipment Exercise tolerated well No report of concerns or symptoms today Strength training completed today  Goals Unmet:  Not Applicable  Comments: Pt able to follow exercise prescription today without complaint.  Will continue to monitor for progression.    Dr. Emily Filbert is Medical Director for [place].  Dr. Ottie Glazier is Medical Director for [facility] Pulmonary Rehabilitation.

## 2021-08-03 NOTE — Telephone Encounter (Signed)
Received call today from Marchelle Folks, Clinical Manager with [facility] stating patient will be discharged from their services. She states that since patient is setup with pulmonary rehab insurance will not cover Home Health services.  Will forward message to MD so he is aware. Patient insurance is requiring she have picc dressing, labs, and antibiotics managed by pulmonary rehab. [name], RMA

## 2021-08-03 NOTE — Telephone Encounter (Signed)
Called [facility] to see if patient would be able to get set up for picc line dressing change and Bi weekly labs. Spoke with [facility] who said they would be able to do this. Will need to fax order. Will forward message to MD for order.  P: [phone] [name], RMA

## 2021-08-04 ENCOUNTER — Other Ambulatory Visit: Payer: Self-pay

## 2021-08-04 ENCOUNTER — Ambulatory Visit: Payer: Medicare HMO | Admitting: *Deleted

## 2021-08-04 ENCOUNTER — Other Ambulatory Visit: Payer: Self-pay | Admitting: Pharmacist

## 2021-08-04 DIAGNOSIS — A318 Other mycobacterial infections: Secondary | ICD-10-CM

## 2021-08-04 DIAGNOSIS — A310 Pulmonary mycobacterial infection: Secondary | ICD-10-CM

## 2021-08-04 DIAGNOSIS — A319 Mycobacterial infection, unspecified: Secondary | ICD-10-CM

## 2021-08-04 NOTE — Progress Notes (Signed)
PICC cap, biopatch, and dressing changed. PICC flushed, drew back blood without issue. Labs drawn as ordered. Patient will have 2 more weeks of pulmonary rehab, then her home health agency will resume PICC care. Patient advised to go to the ER if she has any line issues over the weekend. She is scheduled for PICC dressing change and labs per pharmacy 08/11/21 at 10:30. Andree Coss, RN

## 2021-08-04 NOTE — Addendum Note (Signed)
Addended byJimmy Picket F on: 08/04/2021 01:51 PM   Modules accepted: Orders

## 2021-08-06 LAB — CBC WITH DIFFERENTIAL/PLATELET
Absolute Monocytes: 1487 cells/uL — ABNORMAL HIGH (ref 200–950)
Basophils Absolute: 100 cells/uL (ref 0–200)
Basophils Relative: 0.7 %
Eosinophils Absolute: 400 cells/uL (ref 15–500)
Eosinophils Relative: 2.8 %
HCT: 33.4 % — ABNORMAL LOW (ref 35.0–45.0)
Hemoglobin: 10.2 g/dL — ABNORMAL LOW (ref 11.7–15.5)
Lymphs Abs: 1830 cells/uL (ref 850–3900)
MCH: 26.1 pg — ABNORMAL LOW (ref 27.0–33.0)
MCHC: 30.5 g/dL — ABNORMAL LOW (ref 32.0–36.0)
MCV: 85.4 fL (ref 80.0–100.0)
MPV: 8.6 fL (ref 7.5–12.5)
Monocytes Relative: 10.4 %
Neutro Abs: 10439 cells/uL — ABNORMAL HIGH (ref 1500–7800)
Neutrophils Relative %: 73 %
Platelets: 277 10*3/uL (ref 140–400)
RBC: 3.91 10*6/uL (ref 3.80–5.10)
RDW: 13.1 % (ref 11.0–15.0)
Total Lymphocyte: 12.8 %
WBC: 14.3 10*3/uL — ABNORMAL HIGH (ref 3.8–10.8)

## 2021-08-06 LAB — BASIC METABOLIC PANEL
BUN: 12 mg/dL (ref 7–25)
CO2: 25 mmol/L (ref 20–32)
Calcium: 8.5 mg/dL — ABNORMAL LOW (ref 8.6–10.4)
Chloride: 99 mmol/L (ref 98–110)
Creat: 0.58 mg/dL (ref 0.50–1.05)
Glucose, Bld: 159 mg/dL — ABNORMAL HIGH (ref 65–99)
Potassium: 4 mmol/L (ref 3.5–5.3)
Sodium: 134 mmol/L — ABNORMAL LOW (ref 135–146)

## 2021-08-06 LAB — C-REACTIVE PROTEIN: CRP: 62.5 mg/L — ABNORMAL HIGH (ref <8.0)

## 2021-08-06 LAB — SEDIMENTATION RATE: Sed Rate: >130 mm/h — ABNORMAL HIGH (ref 0–30)

## 2021-08-07 ENCOUNTER — Other Ambulatory Visit: Payer: Self-pay

## 2021-08-07 ENCOUNTER — Telehealth: Payer: Self-pay

## 2021-08-07 DIAGNOSIS — A319 Mycobacterial infection, unspecified: Secondary | ICD-10-CM

## 2021-08-07 DIAGNOSIS — J432 Centrilobular emphysema: Secondary | ICD-10-CM | POA: Diagnosis not present

## 2021-08-07 DIAGNOSIS — A318 Other mycobacterial infections: Secondary | ICD-10-CM

## 2021-08-07 NOTE — Progress Notes (Signed)
Daily Session Note  Patient Details  Name: Victoria Mendoza MRN: [id number] Date of Birth: 1956-05-18 Referring Provider:   Flowsheet Row Pulmonary Rehab from 05/15/2021 in [facility] Cardiac and Pulmonary Rehab  Referring Provider Megan Salon       Encounter Date: 08/07/2021  Check In:  Session Check In - 08/07/21 1405       Check-In   Supervising physician immediately available to respond to emergencies See telemetry face sheet for immediately available ER MD    Location [facility]-Cardiac & Pulmonary Rehab    Staff Present Birdie Sons, MPA, Nino Glow, MS, ASCM CEP, Exercise Physiologist;Joseph Tessie Fass, [place]    Virtual Visit No    Medication changes reported     No    Fall or balance concerns reported    No    Tobacco Cessation No Change    Warm-up and Cool-down Performed on first and last piece of equipment    Resistance Training Performed Yes    VAD Patient? No    PAD/SET Patient? No      Pain Assessment   Currently in Pain? No/denies                Social History   Tobacco Use  Smoking Status Never  Smokeless Tobacco Never    Goals Met:  Independence with exercise equipment Exercise tolerated well No report of concerns or symptoms today Strength training completed today  Goals Unmet:  Not Applicable  Comments: Pt able to follow exercise prescription today without complaint.  Will continue to monitor for progression.    Dr. Emily Filbert is Medical Director for [place].  Dr. Ottie Glazier is Medical Director for [facility] Pulmonary Rehabilitation.

## 2021-08-07 NOTE — Telephone Encounter (Signed)
error 

## 2021-08-09 ENCOUNTER — Other Ambulatory Visit: Payer: Self-pay

## 2021-08-09 VITALS — Ht 61.0 in | Wt 105.9 lb

## 2021-08-09 DIAGNOSIS — A318 Other mycobacterial infections: Secondary | ICD-10-CM

## 2021-08-09 DIAGNOSIS — J432 Centrilobular emphysema: Secondary | ICD-10-CM | POA: Diagnosis not present

## 2021-08-09 DIAGNOSIS — A319 Mycobacterial infection, unspecified: Secondary | ICD-10-CM

## 2021-08-09 NOTE — Progress Notes (Signed)
Daily Session Note  Patient Details  Name: Victoria Mendoza MRN: [id number] Date of Birth: [birth year] Referring Provider:   Flowsheet Row Pulmonary Rehab from 05/15/2021 in [facility] Cardiac and Pulmonary Rehab  Referring Provider Megan Salon       Encounter Date: 08/09/2021  Check In:  Session Check In - 08/09/21 1340       Check-In   Supervising physician immediately available to respond to emergencies See telemetry face sheet for immediately available ER MD    Location [facility]-Cardiac & Pulmonary Rehab    Staff Present Birdie Sons, MPA, Nino Glow, MS, ASCM CEP, Exercise Physiologist;Joseph Tessie Fass, [place]    Virtual Visit No    Medication changes reported     No    Fall or balance concerns reported    No    Tobacco Cessation No Change    Warm-up and Cool-down Performed on first and last piece of equipment    Resistance Training Performed Yes    VAD Patient? No    PAD/SET Patient? No      Pain Assessment   Currently in Pain? No/denies                6 Minute Walk     Row Name 05/15/21 1637 08/09/21 1516       6 Minute Walk   Phase -- Discharge    Distance 712 feet 655 feet    Distance % Change -- -8 %    Distance Feet Change -- -57 ft    Walk Time 4.5 minutes 4.5 minutes    # of Rest Breaks 2 2    MPH 1.8 1.65    METS 2.47 2.36    RPE 20 15    Perceived Dyspnea  4 3    VO2 Peak 8.64 8.27    Symptoms Yes (comment) Yes (comment)    Comments SOB SOB    Resting HR 96 bpm 113 bpm    Resting BP 106/60 106/62    Resting Oxygen Saturation  95 % 96 %    Exercise Oxygen Saturation  during 6 min walk 94 % 93 %    Max Ex. HR 114 bpm 126 bpm    Max Ex. BP 134/64 118/64    2 Minute Post BP 114/64 --         Interval HR   1 Minute HR 110 122    2 Minute HR 108 126    3 Minute HR 111 122    4 Minute HR 108 121    5 Minute HR 111 118    6 Minute HR 114 120    2 Minute Post HR 99 112    Interval Heart Rate? Yes --         Interval Oxygen   Interval Oxygen?  Yes Yes    Baseline Oxygen Saturation % 95 % 96 %    1 Minute Oxygen Saturation % 95 % 95 %    1 Minute Liters of Oxygen -- 0 L  RA    2 Minute Oxygen Saturation % 93 % 94 %    2 Minute Liters of Oxygen -- 0 L    3 Minute Oxygen Saturation % 95 % 93 %    3 Minute Liters of Oxygen -- 0 L    4 Minute Oxygen Saturation % 94 % 96 %    4 Minute Liters of Oxygen -- 0 L    5 Minute Oxygen Saturation % 96 % 95 %  5 Minute Liters of Oxygen -- 0 L    6 Minute Oxygen Saturation % 94 % 95 %    6 Minute Liters of Oxygen -- 0 L    2 Minute Post Oxygen Saturation % 96 % 94 %    2 Minute Post Liters of Oxygen -- 0 L              Social History   Tobacco Use  Smoking Status Never  Smokeless Tobacco Never    Goals Met:  Independence with exercise equipment Exercise tolerated well No report of concerns or symptoms today Strength training completed today  Goals Unmet:  Not Applicable  Comments: Pt able to follow exercise prescription today without complaint.  Will continue to monitor for progression.    Dr. Emily Filbert is Medical Director for [place].  Dr. Ottie Glazier is Medical Director for [facility] Pulmonary Rehabilitation.

## 2021-08-10 ENCOUNTER — Encounter: Payer: Medicare HMO | Admitting: *Deleted

## 2021-08-10 DIAGNOSIS — J432 Centrilobular emphysema: Secondary | ICD-10-CM | POA: Diagnosis not present

## 2021-08-10 NOTE — Progress Notes (Signed)
Daily Session Note  Patient Details  Name: Victoria Mendoza MRN: [id number] Date of Birth: December 03, 1955 Referring Provider:   Flowsheet Row Pulmonary Rehab from 05/15/2021 in [facility] Cardiac and Pulmonary Rehab  Referring Provider Megan Salon       Encounter Date: 08/10/2021  Check In:  Session Check In - 08/10/21 1344       Check-In   Supervising physician immediately available to respond to emergencies See telemetry face sheet for immediately available ER MD    Location [facility]-Cardiac & Pulmonary Rehab    Staff Present Renita Papa, RN BSN;Joseph [place], RCP,RRT,BSRT;Jessica [place], [place], RCEP, CCRP, CCET    Virtual Visit No    Medication changes reported     No    Fall or balance concerns reported    No    Warm-up and Cool-down Performed on first and last piece of equipment    Resistance Training Performed Yes    VAD Patient? No    PAD/SET Patient? No      Pain Assessment   Currently in Pain? No/denies                Social History   Tobacco Use  Smoking Status Never  Smokeless Tobacco Never    Goals Met:  Independence with exercise equipment Exercise tolerated well No report of concerns or symptoms today Strength training completed today  Goals Unmet:  Not Applicable  Comments: Pt able to follow exercise prescription today without complaint.  Will continue to monitor for progression.    Dr. Emily Filbert is Medical Director for [place].  Dr. Ottie Glazier is Medical Director for [facility] Pulmonary Rehabilitation.

## 2021-08-10 NOTE — Patient Instructions (Signed)
Discharge Patient Instructions  Patient Details  Name: Victoria Mendoza MRN: [id number] Date of Birth: 02/08/1956 Referring Provider:  Cliffton Asters, MD   Number of Visits: 36    Reason for Discharge:  Patient reached a stable level of exercise. Patient independent in their exercise.  Smoking History:  Social History   Tobacco Use  Smoking Status Never  Smokeless Tobacco Never    Diagnosis:  Centrilobular emphysema (HCC)  Mycobacterium abscessus infection  Initial Exercise Prescription:  Initial Exercise Prescription - 05/15/21 1600       Date of Initial Exercise RX and Referring Provider   Date 05/15/21    Referring Provider Orvan Falconer      Treadmill   MPH 1.5    Grade 0    Minutes 15    METs 2      Recumbant Bike   Level 1    RPM 60    Minutes 15    METs 2      NuStep   Level 1    SPM 80    Minutes 15    METs 2      Biostep-RELP   Level 1    SPM 50    Minutes 15    METs 2      Prescription Details   Frequency (times per week) 3    Duration Progress to 30 minutes of continuous aerobic without signs/symptoms of physical distress      Intensity   THRR 40-80% of Max Heartrate 119-142    Ratings of Perceived Exertion 11-15    Perceived Dyspnea 0-4      Progression   Progression Continue to progress workloads to maintain intensity without signs/symptoms of physical distress.      Resistance Training   Training Prescription Yes    Weight 3 lb    Reps 10-15             Discharge Exercise Prescription (Final Exercise Prescription Changes):  Exercise Prescription Changes - 08/01/21 1600       Response to Exercise   Blood Pressure (Admit) 108/62    Blood Pressure (Exit) 102/54    Heart Rate (Admit) 113 bpm    Heart Rate (Exercise) 125 bpm    Heart Rate (Exit) 112 bpm    Oxygen Saturation (Admit) 95 %    Oxygen Saturation (Exercise) 90 %    Oxygen Saturation (Exit) 96 %    Rating of Perceived Exertion (Exercise) 15    Perceived Dyspnea  (Exercise) 3    Symptoms SOB, fatigue    Duration Continue with 30 min of aerobic exercise without signs/symptoms of physical distress.    Intensity THRR unchanged      Progression   Progression Continue to progress workloads to maintain intensity without signs/symptoms of physical distress.    Average METs 3.05      Resistance Training   Training Prescription Yes    Weight 4 lb    Reps 10-15      Interval Training   Interval Training No      Treadmill   MPH 1.9    Grade 0    Minutes 15    METs 2.45      NuStep   Level 3    Minutes 15    METs 2.7      REL-XR   Level 4    Minutes 15    METs 4      Home Exercise Plan   Plans to continue exercise at Home (comment)  Treadmill, weights   Frequency Add 2 additional days to program exercise sessions.    Initial Home Exercises Provided 06/19/21             Functional Capacity:  6 Minute Walk     Row Name 05/15/21 1637 08/09/21 1516       6 Minute Walk   Phase -- Discharge    Distance 712 feet 655 feet    Distance % Change -- -8 %    Distance Feet Change -- -57 ft    Walk Time 4.5 minutes 4.5 minutes    # of Rest Breaks 2 2    MPH 1.8 1.65    METS 2.47 2.36    RPE 20 15    Perceived Dyspnea  4 3    VO2 Peak 8.64 8.27    Symptoms Yes (comment) Yes (comment)    Comments SOB SOB    Resting HR 96 bpm 113 bpm    Resting BP 106/60 106/62    Resting Oxygen Saturation  95 % 96 %    Exercise Oxygen Saturation  during 6 min walk 94 % 93 %    Max Ex. HR 114 bpm 126 bpm    Max Ex. BP 134/64 118/64    2 Minute Post BP 114/64 --         Interval HR   1 Minute HR 110 122    2 Minute HR 108 126    3 Minute HR 111 122    4 Minute HR 108 121    5 Minute HR 111 118    6 Minute HR 114 120    2 Minute Post HR 99 112    Interval Heart Rate? Yes --         Interval Oxygen   Interval Oxygen? Yes Yes    Baseline Oxygen Saturation % 95 % 96 %    1 Minute Oxygen Saturation % 95 % 95 %    1 Minute Liters of Oxygen  -- 0 L  RA    2 Minute Oxygen Saturation % 93 % 94 %    2 Minute Liters of Oxygen -- 0 L    3 Minute Oxygen Saturation % 95 % 93 %    3 Minute Liters of Oxygen -- 0 L    4 Minute Oxygen Saturation % 94 % 96 %    4 Minute Liters of Oxygen -- 0 L    5 Minute Oxygen Saturation % 96 % 95 %    5 Minute Liters of Oxygen -- 0 L    6 Minute Oxygen Saturation % 94 % 95 %    6 Minute Liters of Oxygen -- 0 L    2 Minute Post Oxygen Saturation % 96 % 94 %    2 Minute Post Liters of Oxygen -- 0 L             Nutrition & Weight - Outcomes:  Pre Biometrics - 05/15/21 1643       Pre Biometrics   Height 5\' 1"  (1.549 m)    Weight 109 lb (49.4 kg)    BMI (Calculated) 20.61    Single Leg Stand 30 seconds             Post Biometrics - 08/09/21 1519        Post  Biometrics   Height 5\' 1"  (1.549 m)    Weight 105 lb 14.4 oz (48 kg)    BMI (Calculated) 20.02  Single Leg Stand 30 seconds             Nutrition:  Nutrition Therapy & Goals - 06/13/21 1008       Nutrition Therapy   Diet Heart healthy, low Na, Pulmonary MNT    Protein (specify units) 60-65g    Fiber 25 grams    Whole Grain Foods 3 servings    Saturated Fats 12 max. grams    Fruits and Vegetables 8 servings/day    Sodium 1.5 grams      Personal Nutrition Goals   Nutrition Goal ST: continue to add fat to meals when possible, try for more frequent meals, drink in between meals LT: gain weight    Comments Amyjo reports having lower appetite - she feels more short of breath after eating. 3 meals per day with fruit in between meals. She has someone who helps her. B: eggs and tortillas and tomatoes and coffee - black. She uses olive oil. L: soup or vegetables and protein and rice, she enjoys salads in addiiton to whatever lunch is S: fruit or mozarella cheese D: this is usually her larger. Protein with vegetables and carboydrates. She reports her weight has been steady, but she would like to gain some weight. She will  also make smoothies with berries and berries and protein powder- suggested adding a fat to this to help increase calories. She rpeorts eating avocado a lot with her meals. 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 Reviewed general healthy eating and discussed pulmonary MNT - small fequent meals, eating protein foods first, adding fat to whatever she is eating, drinking in between meals.      Intervention Plan   Intervention Prescribe, educate and counsel regarding individualized specific dietary modifications aiming towards targeted core components such as weight, hypertension, lipid management, diabetes, heart failure and other comorbidities.;Nutrition handout(s) given to patient.    Expected Outcomes Short Term Goal: Understand basic principles of dietary content, such as calories, fat, sodium, cholesterol and nutrients.;Short Term Goal: A plan has been developed with personal nutrition goals set during dietitian appointment.;Long Term Goal: Adherence to prescribed nutrition plan.            Goals reviewed with patient; copy given to patient.

## 2021-08-11 ENCOUNTER — Other Ambulatory Visit: Payer: Self-pay

## 2021-08-11 ENCOUNTER — Other Ambulatory Visit: Payer: Self-pay | Admitting: Pharmacist

## 2021-08-11 ENCOUNTER — Telehealth: Payer: Self-pay

## 2021-08-11 ENCOUNTER — Ambulatory Visit: Payer: Medicare HMO

## 2021-08-11 ENCOUNTER — Encounter (HOSPITAL_COMMUNITY)
Admission: RE | Admit: 2021-08-11 | Discharge: 2021-08-11 | Disposition: A | Discharge status: Home / Self Care | Payer: Medicare HMO | Source: Ambulatory Visit | Attending: Family | Admitting: Family

## 2021-08-11 DIAGNOSIS — A319 Mycobacterial infection, unspecified: Secondary | ICD-10-CM | POA: Insufficient documentation

## 2021-08-11 DIAGNOSIS — A318 Other mycobacterial infections: Secondary | ICD-10-CM

## 2021-08-11 DIAGNOSIS — Z452 Encounter for adjustment and management of vascular access device: Secondary | ICD-10-CM | POA: Diagnosis not present

## 2021-08-11 LAB — CBC WITH DIFFERENTIAL/PLATELET
Abs Immature Granulocytes: 0.09 10*3/uL — ABNORMAL HIGH (ref 0.00–0.07)
Basophils Absolute: 0.1 10*3/uL (ref 0.0–0.1)
Basophils Relative: 1 %
Eosinophils Absolute: 0.3 10*3/uL (ref 0.0–0.5)
Eosinophils Relative: 2 %
HCT: 32.8 % — ABNORMAL LOW (ref 36.0–46.0)
Hemoglobin: 10.8 g/dL — ABNORMAL LOW (ref 12.0–15.0)
Immature Granulocytes: 1 %
Lymphocytes Relative: 16 %
Lymphs Abs: 2.4 10*3/uL (ref 0.7–4.0)
MCH: 26.5 pg (ref 26.0–34.0)
MCHC: 32.9 g/dL (ref 30.0–36.0)
MCV: 80.4 fL (ref 80.0–100.0)
Monocytes Absolute: 1.4 10*3/uL — ABNORMAL HIGH (ref 0.1–1.0)
Monocytes Relative: 9 %
Neutro Abs: 10.5 10*3/uL — ABNORMAL HIGH (ref 1.7–7.7)
Neutrophils Relative %: 71 %
Platelets: 470 10*3/uL — ABNORMAL HIGH (ref 150–400)
RBC: 4.08 MIL/uL (ref 3.87–5.11)
RDW: 14.6 % (ref 11.5–15.5)
WBC: 14.8 10*3/uL — ABNORMAL HIGH (ref 4.0–10.5)
nRBC: 0 % (ref 0.0–0.2)

## 2021-08-11 LAB — BASIC METABOLIC PANEL
Anion gap: 8 (ref 5–15)
BUN: 8 mg/dL (ref 8–23)
CO2: 26 mmol/L (ref 22–32)
Calcium: 8.6 mg/dL — ABNORMAL LOW (ref 8.9–10.3)
Chloride: 97 mmol/L — ABNORMAL LOW (ref 98–111)
Creatinine, Ser: 0.66 mg/dL (ref 0.44–1.00)
GFR, Estimated: >60 mL/min (ref >60)
Glucose, Bld: 90 mg/dL (ref 70–99)
Potassium: 3.9 mmol/L (ref 3.5–5.1)
Sodium: 131 mmol/L — ABNORMAL LOW (ref 135–145)

## 2021-08-11 LAB — C-REACTIVE PROTEIN: CRP: 6 mg/dL — ABNORMAL HIGH (ref <1.0)

## 2021-08-11 LAB — SEDIMENTATION RATE: Sed Rate: 100 mm/hr — ABNORMAL HIGH (ref 0–22)

## 2021-08-11 MED ORDER — ALTEPLASE 2 MG IJ SOLR: 2.0000 mg | Freq: Once | INTRAMUSCULAR | Status: AC

## 2021-08-11 MED ORDER — ALTEPLASE 2 MG IJ SOLR
INTRAMUSCULAR | Status: AC
  Administered 2021-08-11: 2 mg
  Filled 2021-08-11: qty 2

## 2021-08-11 NOTE — Telephone Encounter (Signed)
Thank you :)

## 2021-08-11 NOTE — Telephone Encounter (Signed)
Patient arrived for PICC dressing change and lab work due to loss of home health. RN was able to obtain a CBC however when attempting to draw other labs her PICC line completely occluded. Multiple attempts were made at unclotting the line with Heparin and saline flushes. RN changed the caps 4 times, as well as the PICC dressing without success. RN contacted [place] with Short Stay and was able to get an appointment this afternoon for Cathflo; Marcos Eke, NP signed orders which were faxed. Requested labs be drawn by their team as well as RN was unable to obtain them during visit.   RN provided patient with map to Admitting and instructions on how to get to Short Stay.   Patient has 1 week left without home health; moving forward peripheral sticks may be best to avoid line occluding while in office if patient allows.   Marvina Danner Loyola Mast, RN

## 2021-08-11 NOTE — Progress Notes (Signed)
PICC dressing changed; biopatch applied. Attempted to draw labs off line however line occluded mid-lab draw. Numerous attempts were made to unclot line in office with heparin flush and saline. Dressing changed successfully and cap was changed. RN scheduled appointment with Short Stay for cathflo infusion and labs, if possible. Provider notified.    Glenis Musolf Loyola Mast, RN

## 2021-08-14 ENCOUNTER — Telehealth: Payer: Self-pay

## 2021-08-14 ENCOUNTER — Encounter: Payer: Medicare HMO | Admitting: *Deleted

## 2021-08-14 ENCOUNTER — Other Ambulatory Visit: Payer: Self-pay

## 2021-08-14 DIAGNOSIS — J432 Centrilobular emphysema: Secondary | ICD-10-CM | POA: Diagnosis not present

## 2021-08-14 NOTE — Telephone Encounter (Signed)
Patient called requesting Home Health Orders for lab draws. I explained that while she is in Pulm.Rehab she can not have HH orders as she is not homebound. Patient states she will not be coming in for labs as it is way to hard and painful and she will call when rehab is complete Wed to get orders for [facility] lab draws and RN to change bandages.

## 2021-08-14 NOTE — Progress Notes (Signed)
Daily Session Note  Patient Details  Name: Victoria Mendoza MRN: [id number] Date of Birth: 1956-02-28 Referring Provider:   Flowsheet Row Pulmonary Rehab from 05/15/2021 in [facility] Cardiac and Pulmonary Rehab  Referring Provider Megan Salon       Encounter Date: 08/14/2021  Check In:  Session Check In - 08/14/21 1337       Check-In   Supervising physician immediately available to respond to emergencies See telemetry face sheet for immediately available ER MD    Location [facility]-Cardiac & Pulmonary Rehab    Staff Present Renita Papa, RN BSN;Joseph [place], RCP,RRT,BSRT;Kara [place], [place], ASCM CEP, Exercise Physiologist    Virtual Visit No    Medication changes reported     No    Fall or balance concerns reported    No    Warm-up and Cool-down Performed on first and last piece of equipment    Resistance Training Performed Yes    VAD Patient? No    PAD/SET Patient? No      Pain Assessment   Currently in Pain? No/denies                Social History   Tobacco Use  Smoking Status Never  Smokeless Tobacco Never    Goals Met:  Independence with exercise equipment Exercise tolerated well No report of concerns or symptoms today Strength training completed today  Goals Unmet:  Not Applicable  Comments: Pt able to follow exercise prescription today without complaint.  Will continue to monitor for progression.    Dr. Emily Filbert is Medical Director for [place].  Dr. Ottie Glazier is Medical Director for [facility] Pulmonary Rehabilitation.

## 2021-08-16 ENCOUNTER — Encounter: Payer: Self-pay | Admitting: Internal Medicine

## 2021-08-16 ENCOUNTER — Other Ambulatory Visit: Payer: Self-pay

## 2021-08-16 DIAGNOSIS — J432 Centrilobular emphysema: Secondary | ICD-10-CM

## 2021-08-16 DIAGNOSIS — A318 Other mycobacterial infections: Secondary | ICD-10-CM

## 2021-08-16 DIAGNOSIS — A319 Mycobacterial infection, unspecified: Secondary | ICD-10-CM

## 2021-08-16 NOTE — Telephone Encounter (Signed)
Letter faxed to [facility]. Awaiting next steps.   Shamarie Call Loyola Mast, RN

## 2021-08-16 NOTE — Progress Notes (Signed)
Pulmonary Individual Treatment Plan  Patient Details  Name: Zanae Kuehnle MRN: [id number] Date of Birth: 11/27/1955 Referring Provider:   Flowsheet Row Pulmonary Rehab from 05/15/2021 in [facility] Cardiac and Pulmonary Rehab  Referring Provider Orvan Falconer       Initial Encounter Date:  Flowsheet Row Pulmonary Rehab from 05/15/2021 in [facility] Cardiac and Pulmonary Rehab  Date 05/15/21       Visit Diagnosis: Centrilobular emphysema (HCC)  Mycobacterium abscessus infection  Patient's Home Medications on Admission:  Current Outpatient Medications:    albuterol (PROVENTIL) (2.5 MG/3ML) 0.083% nebulizer solution, Take 3 mLs (2.5 mg total) by nebulization every four hours as needed for wheezing or shortness of breath., Disp: 90 mL, Rfl: 12   albuterol (VENTOLIN HFA) 108 (90 Base) MCG/ACT inhaler, INHALE 2 PUFFS INTO THE LUNGS EVERY 6 HOURS AS NEEDED FOR WHEEZING OR SHORTNESS OF BREATH, Disp: 8.5 g, Rfl: 4   amikacin (AMIKIN) 500 MG/2ML SOLN injection, , Disp: , Rfl:    amoxicillin-clavulanate (AUGMENTIN) 875-125 MG tablet, , Disp: , Rfl:    azithromycin (ZITHROMAX) 250 MG tablet, Take 1 tablet (250 mg total) by mouth daily., Disp: 30 tablet, Rfl: 5   azithromycin (ZITHROMAX) 500 MG tablet, Take 500 mg by mouth daily., Disp: , Rfl:    B Complex Vitamins (VITAMIN B COMPLEX) TABS, Take 1 tablet by mouth daily., Disp: , Rfl:    hydrOXYzine (ATARAX/VISTARIL) 25 MG tablet, Take 1 tablet (25 mg total) by mouth as needed (one time dose for increased anxiety)., Disp: 30 tablet, Rfl: 0   megestrol (MEGACE) 400 MG/10ML suspension, Take 10 mLs (400 mg total) by mouth daily., Disp: 300 mL, Rfl: 5   Misc. Devices (PULSE OXIMETER) MISC, 1 Units by Does not apply route as needed., Disp: 1 each, Rfl: 0   Multiple Vitamin (MULTIVITAMIN WITH MINERALS) TABS tablet, Take 1 tablet by mouth daily., Disp: , Rfl:    Omega-3 Fatty Acids (OMEGA 3 PO), Take 4 capsules by mouth daily., Disp: , Rfl:    polyethylene glycol powder  (GLYCOLAX/MIRALAX) 17 GM/SCOOP powder, dissolve  1 capful in water and take by daily as needed for mild constipation., Disp: 510 g, Rfl: 0   polyvinyl alcohol (LIQUIFILM TEARS) 1.4 % ophthalmic solution, Place 1 drop into both eyes daily as needed for dry eyes., Disp: , Rfl:    Pyridoxine HCl (VITAMIN B-6 PO), Take 1 tablet by mouth daily., Disp: , Rfl:    tigecycline (TYGACIL) 50 MG injection, , Disp: , Rfl:    triamcinolone (KENALOG) 0.025 % ointment, Apply 1 application topically 2 (two) times daily., Disp: 30 g, Rfl: 0  Past Medical History: Past Medical History:  Diagnosis Date   Bronchiectasis (HCC)    Hypertension    Pulmonary TB 2018    Tobacco Use: Social History   Tobacco Use  Smoking Status Never  Smokeless Tobacco Never    Labs: Recent Review Flowsheet Data     Labs for ITP Cardiac and Pulmonary Rehab Latest Ref Rng & Units 03/12/2021   Hemoglobin A1c 4.8 - 5.6 % 6.3(H)        Pulmonary Assessment Scores:  Pulmonary Assessment Scores     Row Name 05/15/21 1645 08/09/21 1346       ADL UCSD   ADL Phase -- Exit    SOB Score total 106 84    Rest 5 2    Walk 5 4    Stairs 5 5    Bath 4 3    Dress 4 2  Shop 5 5         CAT Score   CAT Score 29 31         mMRC Score   mMRC Score 4 --             UCSD: Self-administered rating of dyspnea associated with activities of daily living (ADLs) 6-point scale (0 = "not at all" to 5 = "maximal or unable to do because of breathlessness")  Scoring Scores range from 0 to 120.  Minimally important difference is 5 units  CAT: CAT can identify the health impairment of COPD patients and is better correlated with disease progression.  CAT has a scoring range of zero to 40. The CAT score is classified into four groups of low (less than 10), medium (10 - 20), high (21-30) and very high (31-40) based on the impact level of disease on health status. A CAT score over 10 suggests significant symptoms.  A worsening CAT  score could be explained by an exacerbation, poor medication adherence, poor inhaler technique, or progression of COPD or comorbid conditions.  CAT MCID is 2 points  mMRC: mMRC (Modified Medical Research Council) Dyspnea Scale is used to assess the degree of baseline functional disability in patients of respiratory disease due to dyspnea. No minimal important difference is established. A decrease in score of 1 point or greater is considered a positive change.   Pulmonary Function Assessment:   Exercise Target Goals: Exercise Program Goal: Individual exercise prescription set using results from initial 6 min walk test and THRR while considering  patient's activity barriers and safety.   Exercise Prescription Goal: Initial exercise prescription builds to 30-45 minutes a day of aerobic activity, 2-3 days per week.  Home exercise guidelines will be given to patient during program as part of exercise prescription that the participant will acknowledge.  Education: Aerobic Exercise: - Group verbal and visual presentation on the components of exercise prescription. Introduces F.I.T.T principle from ACSM for exercise prescriptions.  Reviews F.I.T.T. principles of aerobic exercise including progression. Written material given at graduation. Flowsheet Row Pulmonary Rehab from 07/19/2021 in [facility] Cardiac and Pulmonary Rehab  Date 07/19/21  Educator AS  Instruction Review Code 1- Verbalizes Understanding       Education: Resistance Exercise: - Group verbal and visual presentation on the components of exercise prescription. Introduces F.I.T.T principle from ACSM for exercise prescriptions  Reviews F.I.T.T. principles of resistance exercise including progression. Written material given at graduation. Flowsheet Row Pulmonary Rehab from 07/19/2021 in [facility] Cardiac and Pulmonary Rehab  Date 05/24/21  Educator KL  Instruction Review Code 1- Verbalizes Understanding        Education: Exercise &  Equipment Safety: - Individual verbal instruction and demonstration of equipment use and safety with use of the equipment. Flowsheet Row Pulmonary Rehab from 07/19/2021 in [facility] Cardiac and Pulmonary Rehab  Date 05/15/21  Educator AS  Instruction Review Code 1- Verbalizes Understanding       Education: Exercise Physiology & General Exercise Guidelines: - Group verbal and written instruction with models to review the exercise physiology of the cardiovascular system and associated critical values. Provides general exercise guidelines with specific guidelines to those with heart or lung disease.    Education: Flexibility, Balance, Mind/Body Relaxation: - Group verbal and visual presentation with interactive activity on the components of exercise prescription. Introduces F.I.T.T principle from ACSM for exercise prescriptions. Reviews F.I.T.T. principles of flexibility and balance exercise training including progression. Also discusses the mind body connection.  Reviews various  relaxation techniques to help reduce and manage stress (i.e. Deep breathing, progressive muscle relaxation, and visualization). Balance handout provided to take home. Written material given at graduation.   Activity Barriers & Risk Stratification:  Activity Barriers & Cardiac Risk Stratification - 05/05/21 1419       Activity Barriers & Cardiac Risk Stratification   Activity Barriers Muscular Weakness;Shortness of Breath   Leg weak            6 Minute Walk:  6 Minute Walk     Row Name 05/15/21 1637 08/09/21 1516       6 Minute Walk   Phase -- Discharge    Distance 712 feet 655 feet    Distance % Change -- -8 %    Distance Feet Change -- -57 ft    Walk Time 4.5 minutes 4.5 minutes    # of Rest Breaks 2 2    MPH 1.8 1.65    METS 2.47 2.36    RPE 20 15    Perceived Dyspnea  4 3    VO2 Peak 8.64 8.27    Symptoms Yes (comment) Yes (comment)    Comments SOB SOB    Resting HR 96 bpm 113 bpm    Resting BP  106/60 106/62    Resting Oxygen Saturation  95 % 96 %    Exercise Oxygen Saturation  during 6 min walk 94 % 93 %    Max Ex. HR 114 bpm 126 bpm    Max Ex. BP 134/64 118/64    2 Minute Post BP 114/64 --         Interval HR   1 Minute HR 110 122    2 Minute HR 108 126    3 Minute HR 111 122    4 Minute HR 108 121    5 Minute HR 111 118    6 Minute HR 114 120    2 Minute Post HR 99 112    Interval Heart Rate? Yes --         Interval Oxygen   Interval Oxygen? Yes Yes    Baseline Oxygen Saturation % 95 % 96 %    1 Minute Oxygen Saturation % 95 % 95 %    1 Minute Liters of Oxygen -- 0 L  RA    2 Minute Oxygen Saturation % 93 % 94 %    2 Minute Liters of Oxygen -- 0 L    3 Minute Oxygen Saturation % 95 % 93 %    3 Minute Liters of Oxygen -- 0 L    4 Minute Oxygen Saturation % 94 % 96 %    4 Minute Liters of Oxygen -- 0 L    5 Minute Oxygen Saturation % 96 % 95 %    5 Minute Liters of Oxygen -- 0 L    6 Minute Oxygen Saturation % 94 % 95 %    6 Minute Liters of Oxygen -- 0 L    2 Minute Post Oxygen Saturation % 96 % 94 %    2 Minute Post Liters of Oxygen -- 0 L            Oxygen Initial Assessment:  Oxygen Initial Assessment - 05/05/21 1420       Home Oxygen   Home Oxygen Device None    Sleep Oxygen Prescription None    Home Exercise Oxygen Prescription None    Home Resting Oxygen Prescription None    Compliance with  Home Oxygen Use Yes      Intervention   Short Term Goals To learn and understand importance of maintaining oxygen saturations>88%;To learn and demonstrate proper pursed lip breathing techniques or other breathing techniques. ;To learn and demonstrate proper use of respiratory medications    Long  Term Goals Exhibits compliance with exercise, home  and travel O2 prescription;Exhibits proper breathing techniques, such as pursed lip breathing or other method taught during program session;Compliance with respiratory medication;Demonstrates proper use of MDI's              Oxygen Re-Evaluation:  Oxygen Re-Evaluation     Row Name 05/22/21 1422 06/15/21 1344 07/26/21 1342         Program Oxygen Prescription   Program Oxygen Prescription -- None None           Home Oxygen   Home Oxygen Device None None None     Sleep Oxygen Prescription None None None     Home Exercise Oxygen Prescription None None None     Home Resting Oxygen Prescription None None None     Compliance with Home Oxygen Use Yes Yes Yes           Goals/Expected Outcomes   Short Term Goals To learn and understand importance of maintaining oxygen saturations>88%;To learn and demonstrate proper pursed lip breathing techniques or other breathing techniques.  To learn and demonstrate proper use of respiratory medications To learn and understand importance of maintaining oxygen saturations>88%;To learn and understand importance of monitoring SPO2 with pulse oximeter and demonstrate accurate use of the pulse oximeter.     Long  Term Goals Exhibits compliance with exercise, home  and travel O2 prescription;Exhibits proper breathing techniques, such as pursed lip breathing or other method taught during program session;Compliance with respiratory medication Demonstrates proper use of MDI's Maintenance of O2 saturations>88%;Verbalizes importance of monitoring SPO2 with pulse oximeter and return demonstration     Comments Reviewed PLB technique with pt.  Talked about how it works and it's importance in maintaining their exercise saturations. Informed Raelea how to use a spacer and when to take her Albuterol when she is short of breath. She understands how to use it but does not use it much because it makes her feel jittery. Informed her to talk to her doctor about her shortness of breath and wheezing. Her lung sounds often have alot of wheezing when she comes to rehab. She states she has not yet talked to her doctor about it. Kyanna has a pulse oximeter to check her oxygen saturation at home.  Informed her why it is important to have one. Reviewed that oxygen saturations should be 88 percent and above. Patient has a pulse oximeter at home to check his oxygen and will continue to use.     Goals/Expected Outcomes Short: Become more profiecient at using PLB.   Long: Become independent at using PLB. Short: speak with her doctor about her breathing. Long: maintain taking medication as needed independently. Short: monitor oxygen at home with exertion. Long: maintain oxygen saturations above 88 percent independently.              Oxygen Discharge (Final Oxygen Re-Evaluation):  Oxygen Re-Evaluation - 07/26/21 1342       Program Oxygen Prescription   Program Oxygen Prescription None      Home Oxygen   Home Oxygen Device None    Sleep Oxygen Prescription None    Home Exercise Oxygen Prescription None    Home Resting Oxygen Prescription  None    Compliance with Home Oxygen Use Yes      Goals/Expected Outcomes   Short Term Goals To learn and understand importance of maintaining oxygen saturations>88%;To learn and understand importance of monitoring SPO2 with pulse oximeter and demonstrate accurate use of the pulse oximeter.    Long  Term Goals Maintenance of O2 saturations>88%;Verbalizes importance of monitoring SPO2 with pulse oximeter and return demonstration    Comments Letta has a pulse oximeter to check her oxygen saturation at home. Informed her why it is important to have one. Reviewed that oxygen saturations should be 88 percent and above. Patient has a pulse oximeter at home to check his oxygen and will continue to use.    Goals/Expected Outcomes Short: monitor oxygen at home with exertion. Long: maintain oxygen saturations above 88 percent independently.             Initial Exercise Prescription:  Initial Exercise Prescription - 05/15/21 1600       Date of Initial Exercise RX and Referring Provider   Date 05/15/21    Referring Provider Orvan Falconer      Treadmill    MPH 1.5    Grade 0    Minutes 15    METs 2      Recumbant Bike   Level 1    RPM 60    Minutes 15    METs 2      NuStep   Level 1    SPM 80    Minutes 15    METs 2      Biostep-RELP   Level 1    SPM 50    Minutes 15    METs 2      Prescription Details   Frequency (times per week) 3    Duration Progress to 30 minutes of continuous aerobic without signs/symptoms of physical distress      Intensity   THRR 40-80% of Max Heartrate 119-142    Ratings of Perceived Exertion 11-15    Perceived Dyspnea 0-4      Progression   Progression Continue to progress workloads to maintain intensity without signs/symptoms of physical distress.      Resistance Training   Training Prescription Yes    Weight 3 lb    Reps 10-15             Perform Capillary Blood Glucose checks as needed.  Exercise Prescription Changes:   Exercise Prescription Changes     Row Name 05/15/21 1600 05/23/21 0800 06/05/21 1500 06/19/21 1300 06/19/21 1400     Response to Exercise   Blood Pressure (Admit) 106/60 102/58 108/62 102/58 --   Blood Pressure (Exercise) 134/64 128/70 126/62 -- --   Blood Pressure (Exit) 114/64 95/58 100/64 102/64 --   Heart Rate (Admit) 96 bpm 100 bpm 82 bpm 94 bpm --   Heart Rate (Exercise) 114 bpm 124 bpm 121 bpm 118 bpm --   Heart Rate (Exit) 99 bpm 100 bpm 117 bpm 120 bpm --   Oxygen Saturation (Admit) 95 % 96 % 98 % 100 % --   Oxygen Saturation (Exercise) 93 % 93 % 94 % 94 % --   Oxygen Saturation (Exit) 96 % 96 % 96 % 95 % --   Rating of Perceived Exertion (Exercise) 20 15 14 15  --   Perceived Dyspnea (Exercise) 4 2 2 1  --   Symptoms SOB SOB SOB, fatigue SOB --   Comments -- first day -- -- --   Duration -- --  Progress to 30 minutes of  aerobic without signs/symptoms of physical distress Progress to 30 minutes of  aerobic without signs/symptoms of physical distress --   Intensity -- -- THRR unchanged THRR unchanged --     Progression   Progression -- Continue to  progress workloads to maintain intensity without signs/symptoms of physical distress. Continue to progress workloads to maintain intensity without signs/symptoms of physical distress. Continue to progress workloads to maintain intensity without signs/symptoms of physical distress. --   Average METs -- 3.4 3.56 3.3 --     Resistance Training   Training Prescription -- Yes Yes Yes --   Weight -- 3 lb 3 lb 3 lb --   Reps -- 10-15 10-15 10-15 --     Interval Training   Interval Training -- No No No --     Treadmill   MPH -- 1.5 2 2.1 --   Grade -- 0 0.5 0.5 --   Minutes -- --   METs -- 2.15 2.67 2.75 --     NuStep   Level -- -- 1 -- --   Minutes -- -- 15 -- --   METs -- -- 4.5 -- --     REL-XR   Level -- --   Minutes -- --   METs -- 4.7 3.5 3.8 --     Home Exercise Plan   Plans to continue exercise at -- -- -- -- Home (comment)  Treadmill, weights   Frequency -- -- -- -- Add 2 additional days to program exercise sessions.   Initial Home Exercises Provided -- -- -- -- 06/19/21    Row Name 07/03/21 1200 07/17/21 1000 08/01/21 1600 08/14/21 0900       Response to Exercise   Blood Pressure (Admit) 124/68 110/60 108/62 110/62    Blood Pressure (Exit) 106/64 108/64 102/54 114/60    Heart Rate (Admit) 105 bpm 109 bpm 113 bpm 112 bpm    Heart Rate (Exercise) 128 bpm 118 bpm 125 bpm 123 bpm    Heart Rate (Exit) 124 bpm 112 bpm 112 bpm 110 bpm    Oxygen Saturation (Admit) 94 % 94 % 95 % 94 %    Oxygen Saturation (Exercise) 95 % 94 % 90 % 95 %    Oxygen Saturation (Exit) 96 % 95 % 96 % 96 %    Rating of Perceived Exertion (Exercise) Perceived Dyspnea (Exercise) Symptoms SOB SOB SOB, fatigue SOB    Duration Continue with 30 min of aerobic exercise without signs/symptoms of physical distress. Continue with 30 min of aerobic exercise without signs/symptoms of physical distress. Continue with 30 min of aerobic exercise without  signs/symptoms of physical distress. Continue with 30 min of aerobic exercise without signs/symptoms of physical distress.    Intensity THRR unchanged THRR unchanged THRR unchanged THRR unchanged         Progression   Progression Continue to progress workloads to maintain intensity without signs/symptoms of physical distress. Continue to progress workloads to maintain intensity without signs/symptoms of physical distress. Continue to progress workloads to maintain intensity without signs/symptoms of physical distress. Continue to progress workloads to maintain intensity without signs/symptoms of physical distress.    Average METs 3.1 3.1 3.05 3         Resistance Training   Training Prescription Yes Yes Yes Yes    Weight 4 lb 4 lb  4 lb 4 lb    Reps 10-15 10-15 10-15 10-15         Interval Training   Interval Training No No No No         Treadmill   MPH 2.3 1.9 1.9 1.8    Grade 3 0 0 0    Minutes 15 15 15 15     METs 3.71 2.45 2.45 2.38         Recumbant Bike   Level 2 -- -- 1    Minutes 15 -- -- 15    METs -- -- -- 3.63         NuStep   Level -- -- 3 --    Minutes -- -- 15 --    METs -- -- 2.7 --         REL-XR   Level 4 -- 4 --    Minutes 15 -- 15 --    METs 2.5 -- 4 --         Home Exercise Plan   Plans to continue exercise at Home (comment)  Treadmill, weights Home (comment)  Treadmill, weights Home (comment)  Treadmill, weights Home (comment)  Treadmill, weights    Frequency Add 2 additional days to program exercise sessions. Add 2 additional days to program exercise sessions. Add 2 additional days to program exercise sessions. Add 2 additional days to program exercise sessions.    Initial Home Exercises Provided 06/19/21 06/19/21 06/19/21 06/19/21             Exercise Comments:   Exercise Comments     Row Name 05/22/21 1421 08/16/21 1353         Exercise Comments First full day of exercise!  Patient was oriented to gym and equipment including  functions, settings, policies, and procedures.  Patient's individual exercise prescription and treatment plan were reviewed.  All starting workloads were established based on the results of the 6 minute walk test done at initial orientation visit.  The plan for exercise progression was also introduced and progression will be customized based on patient's performance and goals. Kamiya graduated today from  rehab with 36 sessions completed.  Details of the patient's exercise prescription and what She needs to do in order to continue the prescription and progress were discussed with patient.  Patient was given a copy of prescription and goals.  Patient verbalized understanding.  Amaurie plans to continue to exercise by using weights and the treadmill at home.               Exercise Goals and Review:   Exercise Goals     Row Name 05/15/21 1642             Exercise Goals   Increase Physical Activity Yes       Intervention Provide advice, education, support and counseling about physical activity/exercise needs.;Develop an individualized exercise prescription for aerobic and resistive training based on initial evaluation findings, risk stratification, comorbidities and participant's personal goals.       Expected Outcomes Short Term: Attend rehab on a regular basis to increase amount of physical activity.;Long Term: Add in home exercise to make exercise part of routine and to increase amount of physical activity.;Long Term: Exercising regularly at least 3-5 days a week.       Increase Strength and Stamina Yes       Intervention Provide advice, education, support and counseling about physical activity/exercise needs.;Develop an individualized exercise prescription for aerobic and resistive training based  on initial evaluation findings, risk stratification, comorbidities and participant's personal goals.       Expected Outcomes Short Term: Increase workloads from initial exercise prescription for  resistance, speed, and METs.;Long Term: Improve cardiorespiratory fitness, muscular endurance and strength as measured by increased METs and functional capacity ( )       Able to understand and use rate of perceived exertion (RPE) scale Yes       Intervention Provide education and explanation on how to use RPE scale       Expected Outcomes Short Term: Able to use RPE daily in rehab to express subjective intensity level;Long Term:  Able to use RPE to guide intensity level when exercising independently       Able to understand and use Dyspnea scale Yes       Intervention Provide education and explanation on how to use Dyspnea scale       Expected Outcomes Short Term: Able to use Dyspnea scale daily in rehab to express subjective sense of shortness of breath during exertion;Long Term: Able to use Dyspnea scale to guide intensity level when exercising independently       Knowledge and understanding of Target Heart Rate Range (THRR) Yes       Intervention Provide education and explanation of THRR including how the numbers were predicted and where they are located for reference       Expected Outcomes Short Term: Able to state/look up THRR;Short Term: Able to use daily as guideline for intensity in rehab;Long Term: Able to use THRR to govern intensity when exercising independently       Able to check pulse independently Yes       Intervention Provide education and demonstration on how to check pulse in carotid and radial arteries.;Review the importance of being able to check your own pulse for safety during independent exercise       Expected Outcomes Short Term: Able to explain why pulse checking is important during independent exercise;Long Term: Able to check pulse independently and accurately       Understanding of Exercise Prescription Yes       Intervention Provide education, explanation, and written materials on patient's individual exercise prescription       Expected Outcomes Short Term: Able to  explain program exercise prescription;Long Term: Able to explain home exercise prescription to exercise independently                Exercise Goals Re-Evaluation :  Exercise Goals Re-Evaluation     Row Name 05/22/21 1421 06/05/21 1553 06/15/21 1351 06/19/21 1310 06/19/21 1416     Exercise Goal Re-Evaluation   Exercise Goals Review Increase Physical Activity;Able to understand and use rate of perceived exertion (RPE) scale;Knowledge and understanding of Target Heart Rate Range (THRR);Understanding of Exercise Prescription;Increase Strength and Stamina;Able to understand and use Dyspnea scale;Able to check pulse independently Increase Physical Activity;Increase Strength and Stamina;Understanding of Exercise Prescription Increase Strength and Stamina;Increase Physical Activity Increase Physical Activity;Increase Strength and Stamina Increase Physical Activity;Increase Strength and Stamina   Comments Reviewed RPE and dyspnea scales, THR and program prescription with pt today.  Pt voiced understanding and was given a copy of goals to take home. Levada has completed 6 full days of exercise.  She continues to have difficulty with leg weakness and is worried that she is over doing it when he legs begin to hurt and feel tired.  We have encouraged rest breaks and tried a variety of equipment options to help her ability  to exericse.  We will conitnue to monitor her progress. Lawsyn has a bike and a treadmill at home. She stretches at home and does alot of balance exercise. She is still concerned with her leg weakness. 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 Jarrett does feel weak a lot with exercise.  Staff will review home exercise and encourage doing some squats at home to help with lower body strength. Reviewed home exercise with pt today.  Pt plans to walk and do weights for exercise. Patient uses handweights at home, went over structure on  resistance training, encouraged to complete 2 sets if can. Patient is discouraged and doesn't feel that she is progressing.  Reviewed THR, pulse, RPE, sign and symptoms, pulse oximetery and when to call 911 or MD.  Also discussed weather considerations and indoor options.  Pt voiced understanding.   Expected Outcomes Short: Use RPE daily to regulate intensity. Long: Follow program prescription in THR. Short: Continue to attend rehab regularly Long: Continue to build stamina in legs Short: maintain exercise in [facility]. Long: maintain exercise at home independently. Short: review home exercise with EP Long:  build lower body strength Short: Continue to check HR and O2 when exercising Long: Continue to exercise independently at appropriate prescription    Row Name 07/03/21 1156 07/17/21 1010 08/01/21 1624 08/14/21 0935 08/14/21 0936     Exercise Goal Re-Evaluation   Exercise Goals Review Increase Physical Activity;Increase Strength and Stamina;Understanding of Exercise Prescription Increase Physical Activity;Increase Strength and Stamina Increase Physical Activity;Increase Strength and Stamina;Understanding of Exercise Prescription Increase Physical Activity;Increase Strength and Stamina Increase Physical Activity;Increase Strength and Stamina   Comments Pamlea is doing well in rehab.  She is on level 4 on the XR. We will continue to monitor her progress. Jessice continues to do well.  Oxygen stays in 90s.  Staff will encourage to try 4 lb for strength work Curtina is doing well in rehab.  She is already nearing graduation and we expect to see an improvement in her post .  She is up to 1.9 mph on the treadmill.  We will continue to monitor her progress. Marlette has improved Her TM speed and has increased to 4 lb for strength work.   Expected Outcomes Short: Increase workload on bike Long: Continue to improve stamina Short: increase to 4 lb Long: build overall stamina short:Improve post Long: Continue to  improve stamina. -- --    Row Name 08/14/21 0940             Exercise Goal Re-Evaluation   Comments Sh eplasn to use her TM and weights at home after graduation.       Expected Outcomes Short:  complete LW Long:maintain exercise on her own                Discharge Exercise Prescription (Final Exercise Prescription Changes):  Exercise Prescription Changes - 08/14/21 0900       Response to Exercise   Blood Pressure (Admit) 110/62    Blood Pressure (Exit) 114/60    Heart Rate (Admit) 112 bpm    Heart Rate (Exercise) 123 bpm    Heart Rate (Exit) 110 bpm    Oxygen Saturation (Admit) 94 %    Oxygen Saturation (Exercise) 95 %    Oxygen Saturation (Exit) 96 %    Rating of Perceived Exertion (Exercise) 15    Perceived Dyspnea (Exercise) 3  Symptoms SOB    Duration Continue with 30 min of aerobic exercise without signs/symptoms of physical distress.    Intensity THRR unchanged      Progression   Progression Continue to progress workloads to maintain intensity without signs/symptoms of physical distress.    Average METs 3      Resistance Training   Training Prescription Yes    Weight 4 lb    Reps 10-15      Interval Training   Interval Training No      Treadmill   MPH 1.8    Grade 0    Minutes 15    METs 2.38      Recumbant Bike   Level 1    Minutes 15    METs 3.63      Home Exercise Plan   Plans to continue exercise at Home (comment)   Treadmill, weights   Frequency Add 2 additional days to program exercise sessions.    Initial Home Exercises Provided 06/19/21             Nutrition:  Target Goals: Understanding of nutrition guidelines, daily intake of sodium 1500mg , cholesterol 200mg , calories 30% from fat and 7% or less from saturated fats, daily to have 5 or more servings of fruits and vegetables.  Education: All About Nutrition: -Group instruction provided by verbal, written material, interactive activities, discussions, models, and posters to  present general guidelines for heart healthy nutrition including fat, fiber, MyPlate, the role of sodium in heart healthy nutrition, utilization of the nutrition label, and utilization of this knowledge for meal planning. Follow up email sent as well. Written material given at graduation. Flowsheet Row Pulmonary Rehab from 07/19/2021 in [facility] Cardiac and Pulmonary Rehab  Date 06/07/21  Educator [facility]  Instruction Review Code 1- Verbalizes Understanding       Biometrics:  Pre Biometrics - 05/15/21 1643       Pre Biometrics   Height 5\' 1"  (1.549 m)    Weight 109 lb (49.4 kg)    BMI (Calculated) 20.61    Single Leg Stand 30 seconds             Post Biometrics - 08/09/21 1519        Post  Biometrics   Height 5\' 1"  (1.549 m)    Weight 105 lb 14.4 oz (48 kg)    BMI (Calculated) 20.02    Single Leg Stand 30 seconds             Nutrition Therapy Plan and Nutrition Goals:  Nutrition Therapy & Goals - 06/13/21 1008       Nutrition Therapy   Diet Heart healthy, low Na, Pulmonary MNT    Protein (specify units) 60-65g    Fiber 25 grams    Whole Grain Foods 3 servings    Saturated Fats 12 max. grams    Fruits and Vegetables 8 servings/day    Sodium 1.5 grams      Personal Nutrition Goals   Nutrition Goal ST: continue to add fat to meals when possible, try for more frequent meals, drink in between meals LT: gain weight    Comments Bobie reports having lower appetite - she feels more short of breath after eating. 3 meals per day with fruit in between meals. She has someone who helps her. B: eggs and tortillas and tomatoes and coffee - black. She uses olive oil. L: soup or vegetables and protein and rice, she enjoys salads in addiiton to whatever lunch is  S: fruit or mozarella cheese D: this is usually her larger. Protein with vegetables and carboydrates. She reports her weight has been steady, but she would like to gain some weight. She will also make smoothies with berries and  berries and protein powder- suggested adding a fat to this to help increase calories. She rpeorts eating avocado a lot with her meals. 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 Reviewed general healthy eating and discussed pulmonary MNT - small fequent meals, eating protein foods first, adding fat to whatever she is eating, drinking in between meals.      Intervention Plan   Intervention Prescribe, educate and counsel regarding individualized specific dietary modifications aiming towards targeted core components such as weight, hypertension, lipid management, diabetes, heart failure and other comorbidities.;Nutrition handout(s) given to patient.    Expected Outcomes Short Term Goal: Understand basic principles of dietary content, such as calories, fat, sodium, cholesterol and nutrients.;Short Term Goal: A plan has been developed with personal nutrition goals set during dietitian appointment.;Long Term Goal: Adherence to prescribed nutrition plan.             Nutrition Assessments:  MEDIFICTS Score Key: ?70 Need to make dietary changes  40-70 Heart Healthy Diet ? 40 Therapeutic Level Cholesterol Diet  Flowsheet Row Pulmonary Rehab from 08/09/2021 in [facility] Cardiac and Pulmonary Rehab  Picture Your Plate Total Score on Admission 62  Picture Your Plate Total Score on Discharge 56      Picture Your Plate Scores: <33 Unhealthy dietary pattern with much room for improvement. 41-50 Dietary pattern unlikely to meet recommendations for good health and room for improvement. 51-60 More healthful dietary pattern, with some room for improvement.  >60 Healthy dietary pattern, although there may be some specific behaviors that could be improved.   Nutrition Goals Re-Evaluation:  Nutrition Goals Re-Evaluation     Row Name 07/26/21 1339             Goals   Current Weight 104 lb (47.2 kg)       Nutrition Goal Weight gain.       Comment Grettell is trying  to eat more food and get more protien to help her gain weight.       Expected Outcome Short: gain more weight. Long: maintain weight gain.                Nutrition Goals Discharge (Final Nutrition Goals Re-Evaluation):  Nutrition Goals Re-Evaluation - 07/26/21 1339       Goals   Current Weight 104 lb (47.2 kg)    Nutrition Goal Weight gain.    Comment Nazia is trying to eat more food and get more protien to help her gain weight.    Expected Outcome Short: gain more weight. Long: maintain weight gain.             Psychosocial: Target Goals: Acknowledge presence or absence of significant depression and/or stress, maximize coping skills, provide positive support system. Participant is able to verbalize types and ability to use techniques and skills needed for reducing stress and depression.   Education: Stress, Anxiety, and Depression - Group verbal and visual presentation to define topics covered.  Reviews how body is impacted by stress, anxiety, and depression.  Also discusses healthy ways to reduce stress and to treat/manage anxiety and depression.  Written material given at graduation.   Education: Sleep Hygiene -Provides group verbal and written instruction about how sleep can affect your health.  Define  sleep hygiene, discuss sleep cycles and impact of sleep habits. Review good sleep hygiene tips.    Initial Review & Psychosocial Screening:  Initial Psych Review & Screening - 05/05/21 1422       Initial Review   Current issues with Current Stress Concerns    Source of Stress Concerns Chronic Illness    Comments Sometimes feelings of desperation about my illness.      Family Dynamics   Good Support System? Yes   2 sisters, 40 min and 1 hour away. Both come weekly to help.     Barriers   Psychosocial barriers to participate in program There are no identifiable barriers or psychosocial needs.      Screening Interventions   Interventions Encouraged to exercise     Expected Outcomes Short Term goal: Utilizing psychosocial counselor, staff and physician to assist with identification of specific Stressors or current issues interfering with healing process. Setting desired goal for each stressor or current issue identified.;Long Term Goal: Stressors or current issues are controlled or eliminated.;Short Term goal: Identification and review with participant of any Quality of Life or Depression concerns found by scoring the questionnaire.;Long Term goal: The participant improves quality of Life and PHQ9 Scores as seen by post scores and/or verbalization of changes             Quality of Life Scores:  Scores of 19 and below usually indicate a poorer quality of life in these areas.  A difference of  2-3 points is a clinically meaningful difference.  A difference of 2-3 points in the total score of the Quality of Life Index has been associated with significant improvement in overall quality of life, self-image, physical symptoms, and general health in studies assessing change in quality of life.  PHQ-9: Recent Review Flowsheet Data     Depression screen [facility] 2/9 08/09/2021 07/26/2021 07/13/2021 06/12/2021 05/15/2021   Decreased Interest 3 0 Down, Depressed, Hopeless 1 0 0 1 2   PHQ - 2 Score 4 0 Altered sleeping 1 - 1 2 0   Tired, decreased energy 2 - Change in appetite 2 - Feeling bad or failure about yourself  0 - 0 0 0   Trouble concentrating 0 - 0 0 0   Moving slowly or fidgety/restless 0 - 0 0 0   Suicidal thoughts 0 - 0 0 0   PHQ-9 Score 9 - Difficult doing work/chores Somewhat difficult - Very difficult Very difficult -      Interpretation of Total Score  Total Score Depression Severity:  1-4 = Minimal depression, 5-9 = Mild depression, 10-14 = Moderate depression, 15-19 = Moderately severe depression, 20-27 = Severe depression   Psychosocial Evaluation and Intervention:  Psychosocial Evaluation - 05/05/21  1441       Psychosocial Evaluation & Interventions   Interventions Encouraged to exercise with the program and follow exercise prescription    Comments Onna has no barriers to attending the program. She lives alone. She has 2 sisters that live an hour or less away that are at her home weekly or more to help her. She wants to attend and see if she can improve her shortness of breath symptoms and improove her activitiy levels.  She states she does not have depression, but does some time feel a desperation over her chronic illness. She is limited in  her activities because of her shortness of breath. SHe does listen to music and watch some TV to keep herslf occupied.   She should do well in the program as she is ready to work on doing what she can to improve her activity levels. She is already using a treadmill at home for short sessions.    Expected Outcomes STG: Ayjah attends all scheduled sessions. She is able to progress with her exercise levels and gain staminaand decreased shortness of breath. LTG: Geraldina continues with her progress to enable her to be more active longer periods of time.    Continue Psychosocial Services  Follow up required by staff             Psychosocial Re-Evaluation:  Psychosocial Re-Evaluation     Row Name 06/12/21 1420 07/13/21 1352           Psychosocial Re-Evaluation   Current issues with -- Current Stress Concerns      Comments Reviewed patient health questionnaire (PHQ-9) with patient for follow up. Previously, patients score indicated signs/symptoms of depression.  Reviewed to see if patient is improving symptom wise while in program.  Score has stayed the same and it is due to her breathing and her physical limitations. Reviewed patient health questionnaire (PHQ-9) with patient for follow up. Previously, patients score indicated signs/symptoms of depression.  Reviewed to see if patient is improving symptom wise while in program.  Score improved and patient  states that it is because she has been able to exercise. She states that due to her shortness of breath and fatigue is why her scores are reflected.      Expected Outcomes Short: Continue to attend [facility] regularly for regular exercise and social engagement. Long: Continue to improve symptoms and manage a positive mental state. Short: Continue to attend [facility] regularly for regular exercise and social engagement. Long: Continue to improve symptoms and manage a positive mental state.      Interventions Encouraged to attend Pulmonary Rehabilitation for the exercise Encouraged to attend Pulmonary Rehabilitation for the exercise      Continue Psychosocial Services  Follow up required by staff Follow up required by staff               Psychosocial Discharge (Final Psychosocial Re-Evaluation):  Psychosocial Re-Evaluation - 07/13/21 1352       Psychosocial Re-Evaluation   Current issues with Current Stress Concerns    Comments Reviewed patient health questionnaire (PHQ-9) with patient for follow up. Previously, patients score indicated signs/symptoms of depression.  Reviewed to see if patient is improving symptom wise while in program.  Score improved and patient states that it is because she has been able to exercise. She states that due to her shortness of breath and fatigue is why her scores are reflected.    Expected Outcomes Short: Continue to attend [facility] regularly for regular exercise and social engagement. Long: Continue to improve symptoms and manage a positive mental state.    Interventions Encouraged to attend Pulmonary Rehabilitation for the exercise    Continue Psychosocial Services  Follow up required by staff             Education: Education Goals: Education classes will be provided on a weekly basis, covering required topics. Participant will state understanding/return demonstration of topics presented.  Learning Barriers/Preferences:  Learning  Barriers/Preferences - 05/05/21 1430       Learning Barriers/Preferences   Learning Barriers None    Learning Preferences None  General Pulmonary Education Topics:  Infection Prevention: - Provides verbal and written material to individual with discussion of infection control including proper hand washing and proper equipment cleaning during exercise session. Flowsheet Row Pulmonary Rehab from 07/19/2021 in [facility] Cardiac and Pulmonary Rehab  Date 05/15/21  Educator AS  Instruction Review Code 1- Verbalizes Understanding       Falls Prevention: - Provides verbal and written material to individual with discussion of falls prevention and safety. Flowsheet Row Pulmonary Rehab from 07/19/2021 in [facility] Cardiac and Pulmonary Rehab  Date 05/15/21  Educator AS  Instruction Review Code 1- Verbalizes Understanding       Chronic Lung Disease Review: - Group verbal instruction with posters, models, PowerPoint presentations and videos,  to review new updates, new respiratory medications, new advancements in procedures and treatments. Providing information on websites and "800" numbers for continued self-education. Includes information about supplement oxygen, available portable oxygen systems, continuous and intermittent flow rates, oxygen safety, concentrators, and Medicare reimbursement for oxygen. Explanation of Pulmonary Drugs, including class, frequency, complications, importance of spacers, rinsing mouth after steroid MDI's, and proper cleaning methods for nebulizers. Review of basic lung anatomy and physiology related to function, structure, and complications of lung disease. Review of risk factors. Discussion about methods for diagnosing sleep apnea and types of masks and machines for OSA. Includes a review of the use of types of environmental controls: home humidity, furnaces, filters, dust mite/pet prevention, HEPA vacuums. Discussion about weather changes, air quality and the  benefits of nasal washing. Instruction on Warning signs, infection symptoms, calling MD promptly, preventive modes, and value of vaccinations. Review of effective airway clearance, coughing and/or vibration techniques. Emphasizing that all should Create an Action Plan. Written material given at graduation. Flowsheet Row Pulmonary Rehab from 07/19/2021 in [facility] Cardiac and Pulmonary Rehab  Date 06/28/21  Educator [facility]  Instruction Review Code 1- Verbalizes Understanding       AED/CPR: - Group verbal and written instruction with the use of models to demonstrate the basic use of the AED with the basic ABC's of resuscitation.    Anatomy and Cardiac Procedures: - Group verbal and visual presentation and models provide information about basic cardiac anatomy and function. Reviews the testing methods done to diagnose heart disease and the outcomes of the test results. Describes the treatment choices: Medical Management, Angioplasty, or Coronary Bypass Surgery for treating various heart conditions including Myocardial Infarction, Angina, Valve Disease, and Cardiac Arrhythmias.  Written material given at graduation. Flowsheet Row Pulmonary Rehab from 07/19/2021 in [facility] Cardiac and Pulmonary Rehab  Date 05/24/21  Educator [facility]  Instruction Review Code 1- Verbalizes Understanding       Medication Safety: - Group verbal and visual instruction to review commonly prescribed medications for heart and lung disease. Reviews the medication, class of the drug, and side effects. Includes the steps to properly store meds and maintain the prescription regimen.  Written material given at graduation.   Other: -Provides group and verbal instruction on various topics (see comments)   Knowledge Questionnaire Score:  Knowledge Questionnaire Score - 08/09/21 1348       Knowledge Questionnaire Score   Pre Score 4/18    Post Score 15/18              Core Components/Risk Factors/Patient Goals at Admission:   Personal Goals and Risk Factors at Admission - 05/15/21 1644       Core Components/Risk Factors/Patient Goals on Admission    Weight Management Yes;Weight Gain  Intervention Weight Management: Develop a combined nutrition and exercise program designed to reach desired caloric intake, while maintaining appropriate intake of nutrient and fiber, sodium and fats, and appropriate energy expenditure required for the weight goal.;Weight Management: Provide education and appropriate resources to help participant work on and attain dietary goals.    Admit Weight 108 lb (49 kg)    Goal Weight: Short Term 115 lb (52.2 kg)    Goal Weight: Long Term 115 lb (52.2 kg)    Expected Outcomes Short Term: Continue to assess and modify interventions until short term weight is achieved;Long Term: Adherence to nutrition and physical activity/exercise program aimed toward attainment of established weight goal;Weight Gain: Understanding of general recommendations for a high calorie, high protein meal plan that promotes weight gain by distributing calorie intake throughout the day with the consumption for 4-5 meals, snacks, and/or supplements    Intervention Provide education, individualized exercise plan and daily activity instruction to help decrease symptoms of SOB with activities of daily living.    Expected Outcomes Short Term: Improve cardiorespiratory fitness to achieve a reduction of symptoms when performing ADLs;Long Term: Be able to perform more ADLs without symptoms or delay the onset of symptoms    Increase knowledge of respiratory medications and ability to use respiratory devices properly  Yes    Intervention Provide education and demonstration as needed of appropriate use of medications, inhalers, and oxygen therapy.    Expected Outcomes Short Term: Achieves understanding of medications use. Understands that oxygen is a medication prescribed by physician. Demonstrates appropriate use of inhaler and oxygen  therapy.;Long Term: Maintain appropriate use of medications, inhalers, and oxygen therapy.             Education:Diabetes - Individual verbal and written instruction to review signs/symptoms of diabetes, desired ranges of glucose level fasting, after meals and with exercise. Acknowledge that pre and post exercise glucose checks will be done for 3 sessions at entry of program.   Know Your Numbers and Heart Failure: - Group verbal and visual instruction to discuss disease risk factors for cardiac and pulmonary disease and treatment options.  Reviews associated critical values for Overweight/Obesity, Hypertension, Cholesterol, and Diabetes.  Discusses basics of heart failure: signs/symptoms and treatments.  Introduces Heart Failure Zone chart for action plan for heart failure.  Written material given at graduation. Flowsheet Row Pulmonary Rehab from 07/19/2021 in [facility] Cardiac and Pulmonary Rehab  Date 06/21/21  Educator [facility]  Instruction Review Code 1- Verbalizes Understanding       Core Components/Risk Factors/Patient Goals Review:   Goals and Risk Factor Review     Row Name 06/15/21 1349 07/26/21 1345           Core Components/Risk Factors/Patient Goals Review   Personal Goals Review Improve shortness of breath with ADL's Weight Management/Obesity;Other      Review Spoke to patient about their shortness of breath and what they can do to improve. Patient has been informed of breathing techniques when starting the program. Patient is informed to tell staff if they have had any med changes and that certain meds they are taking or not taking can be causing shortness of breath. Thao is tryinh to gain weight. She is having lung surgery October third. She will finish the program and be as strong as she can for surgery.      Expected Outcomes Short: Attend [facility] regularly to improve shortness of breath with ADL's. Long: maintain independence with ADL's Short: Have Lung Surgery. Long:  improve  shortness of breath.               Core Components/Risk Factors/Patient Goals at Discharge (Final Review):   Goals and Risk Factor Review - 07/26/21 1345       Core Components/Risk Factors/Patient Goals Review   Personal Goals Review Weight Management/Obesity;Other    Review [facility] is tryinh to gain weight. She is having lung surgery October third. She will finish the program and be as strong as she can for surgery.    Expected Outcomes Short: Have Lung Surgery. Long: improve shortness of breath.             ITP Comments:  ITP Comments     Row Name 05/05/21 1447 05/22/21 1421 06/07/21 0551 06/13/21 1027 07/05/21 0635   ITP Comments Virtual orientation call completed today. shehas an appointment on Date: 05/15/2021  for EP eval and gym Orientation.  Document[facility] of diagnosis [facility]  Date: 04/26/2021 and 03/28/2021 . First full day of exercise!  Patient was oriented to gym and equipment including functions, settings, policies, and procedures.  Patient's individual exercise prescription and treatment plan were reviewed.  All starting workloads were established based on the results of the 6 minute walk test done at initial orientation visit.  The plan for exercise progression was also introduced and progression will be customized based on patient's performance and goals. 30 Day review completed. Medical Director ITP review done, changes made as directed, and signed approval by Medical Director.     New to program Completed initial RD consultation 30 Day review completed. Medical Director ITP review done, changes made as directed, and signed approval by Medical Director.    Row Name 08/02/21 0551 08/16/21 1353         ITP Comments 30 Day review completed. Medical Director ITP review done, changes made as directed, and signed approval by Medical Director. Liesel graduated today from  rehab with 36 sessions completed.  Details of the patient's exercise prescription and  what She needs to do in order to continue the prescription and progress were discussed with patient.  Patient was given a copy of prescription and goals.  Patient verbalized understanding.  Michell plans to continue to exercise by using weights and the treadmill at home.               Comments: discharge ITP

## 2021-08-16 NOTE — Telephone Encounter (Signed)
Patient called to report that she's been discharged from pulmonary rehab and would like to re-establish home health. RN spoke with Amy, a pharmacist with [facility] who states that they will need documentation stating the patient is homebound for insurance purposes before a home health agency will work with her. Note needs to be from provider and faxed to their office.   [facility] - (fax) [phone]  Rosanna Randy, RN

## 2021-08-16 NOTE — Progress Notes (Signed)
Discharge Progress Report  Patient Details  Name: Victoria Mendoza MRN: [id number] Date of Birth: 05-04-1956 Referring Provider:   Flowsheet Row Pulmonary Rehab from 05/15/2021 in [facility] Cardiac and Pulmonary Rehab  Referring Provider Megan Salon        Number of Visits: 7  Reason for Discharge:  Patient reached a stable level of exercise. Patient independent in their exercise. Patient has met program and personal goals.  Smoking History:  Social History   Tobacco Use  Smoking Status Never  Smokeless Tobacco Never    Diagnosis:  Centrilobular emphysema (HCC)  Mycobacterium abscessus infection  ADL UCSD:  Pulmonary Assessment Scores     Row Name 05/15/21 1645 08/09/21 1346       ADL UCSD   ADL Phase -- Exit    SOB Score total 106 84    Rest 5 2    Walk 5 4    Stairs 5 5    Bath 4 3    Dress 4 2    Shop 5 5         CAT Score   CAT Score 29 31         mMRC Score   mMRC Score 4 --             Initial Exercise Prescription:  Initial Exercise Prescription - 05/15/21 1600       Date of Initial Exercise RX and Referring Provider   Date 05/15/21    Referring Provider Megan Salon      Treadmill   MPH 1.5    Grade 0    Minutes 15    METs 2      Recumbant Bike   Level 1    RPM 60    Minutes 15    METs 2      NuStep   Level 1    SPM 80    Minutes 15    METs 2      Biostep-RELP   Level 1    SPM 50    Minutes 15    METs 2      Prescription Details   Frequency (times per week) 3    Duration Progress to 30 minutes of continuous aerobic without signs/symptoms of physical distress      Intensity   THRR 40-80% of Max Heartrate 119-142    Ratings of Perceived Exertion 11-15    Perceived Dyspnea 0-4      Progression   Progression Continue to progress workloads to maintain intensity without signs/symptoms of physical distress.      Resistance Training   Training Prescription Yes    Weight 3 lb    Reps 10-15             Discharge  Exercise Prescription (Final Exercise Prescription Changes):  Exercise Prescription Changes - 08/14/21 0900       Response to Exercise   Blood Pressure (Admit) 110/62    Blood Pressure (Exit) 114/60    Heart Rate (Admit) 112 bpm    Heart Rate (Exercise) 123 bpm    Heart Rate (Exit) 110 bpm    Oxygen Saturation (Admit) 94 %    Oxygen Saturation (Exercise) 95 %    Oxygen Saturation (Exit) 96 %    Rating of Perceived Exertion (Exercise) 15    Perceived Dyspnea (Exercise) 3    Symptoms SOB    Duration Continue with 30 min of aerobic exercise without signs/symptoms of physical distress.    Intensity THRR unchanged  Progression   Progression Continue to progress workloads to maintain intensity without signs/symptoms of physical distress.    Average METs 3      Resistance Training   Training Prescription Yes    Weight 4 lb    Reps 10-15      Interval Training   Interval Training No      Treadmill   MPH 1.8    Grade 0    Minutes 15    METs 2.38      Recumbant Bike   Level 1    Minutes 15    METs 3.63      Home Exercise Plan   Plans to continue exercise at Home (comment)   Treadmill, weights   Frequency Add 2 additional days to program exercise sessions.    Initial Home Exercises Provided 06/19/21             Functional Capacity:  6 Minute Walk     Row Name 05/15/21 1637 08/09/21 1516       6 Minute Walk   Phase -- Discharge    Distance 712 feet 655 feet    Distance % Change -- -8 %    Distance Feet Change -- -57 ft    Walk Time 4.5 minutes 4.5 minutes    # of Rest Breaks 2 2    MPH 1.8 1.65    METS 2.47 2.36    RPE 20 15    Perceived Dyspnea  4 3    VO2 Peak 8.64 8.27    Symptoms Yes (comment) Yes (comment)    Comments SOB SOB    Resting HR 96 bpm 113 bpm    Resting BP 106/60 106/62    Resting Oxygen Saturation  95 % 96 %    Exercise Oxygen Saturation  during 6 min walk 94 % 93 %    Max Ex. HR 114 bpm 126 bpm    Max Ex. BP 134/64 118/64    2  Minute Post BP 114/64 --         Interval HR   1 Minute HR 110 122    2 Minute HR 108 126    3 Minute HR 111 122    4 Minute HR 108 121    5 Minute HR 111 118    6 Minute HR 114 120    2 Minute Post HR 99 112    Interval Heart Rate? Yes --         Interval Oxygen   Interval Oxygen? Yes Yes    Baseline Oxygen Saturation % 95 % 96 %    1 Minute Oxygen Saturation % 95 % 95 %    1 Minute Liters of Oxygen -- 0 L  RA    2 Minute Oxygen Saturation % 93 % 94 %    2 Minute Liters of Oxygen -- 0 L    3 Minute Oxygen Saturation % 95 % 93 %    3 Minute Liters of Oxygen -- 0 L    4 Minute Oxygen Saturation % 94 % 96 %    4 Minute Liters of Oxygen -- 0 L    5 Minute Oxygen Saturation % 96 % 95 %    5 Minute Liters of Oxygen -- 0 L    6 Minute Oxygen Saturation % 94 % 95 %    6 Minute Liters of Oxygen -- 0 L    2 Minute Post Oxygen Saturation % 96 % 94 %  2 Minute Post Liters of Oxygen -- 0 L             Psychological, QOL, Others - Outcomes: PHQ 2/9: Depression screen [facility] 2/9 08/09/2021 07/26/2021 07/13/2021 06/12/2021 05/15/2021  Decreased Interest 3 0 3 3 3   Down, Depressed, Hopeless 1 0 0 1 2  PHQ - 2 Score 4 0 3 4 5   Altered sleeping 1 - 1 2 0  Tired, decreased energy 2 - 3 3 3   Change in appetite 2 - 2 1 2   Feeling bad or failure about yourself  0 - 0 0 0  Trouble concentrating 0 - 0 0 0  Moving slowly or fidgety/restless 0 - 0 0 0  Suicidal thoughts 0 - 0 0 0  PHQ-9 Score 9 - 9 10 10   Difficult doing work/chores Somewhat difficult - Very difficult Very difficult -    Quality of Life:    Nutrition & Weight - Outcomes:  Pre Biometrics - 05/15/21 1643       Pre Biometrics   Height 5' 1"  (1.549 m)    Weight 109 lb (49.4 kg)    BMI (Calculated) 20.61    Single Leg Stand 30 seconds             Post Biometrics - 08/09/21 1519        Post  Biometrics   Height 5' 1"  (1.549 m)    Weight 105 lb 14.4 oz (48 kg)    BMI (Calculated) 20.02    Single Leg Stand 30  seconds             Nutrition:  Nutrition Therapy & Goals - 06/13/21 1008       Nutrition Therapy   Diet Heart healthy, low Na, Pulmonary MNT    Protein (specify units) 60-65g    Fiber 25 grams    Whole Grain Foods 3 servings    Saturated Fats 12 max. grams    Fruits and Vegetables 8 servings/day    Sodium 1.5 grams      Personal Nutrition Goals   Nutrition Goal ST: continue to add fat to meals when possible, try for more frequent meals, drink in between meals LT: gain weight    Comments Perris reports having lower appetite - she feels more short of breath after eating. 3 meals per day with fruit in between meals. She has someone who helps her. B: eggs and tortillas and tomatoes and coffee - black. She uses olive oil. L: soup or vegetables and protein and rice, she enjoys salads in addiiton to whatever lunch is S: fruit or mozarella cheese D: this is usually her larger. Protein with vegetables and carboydrates. She reports her weight has been steady, but she would like to gain some weight. She will also make smoothies with berries and berries and protein powder- suggested adding a fat to this to help increase calories. She rpeorts eating avocado a lot with her meals. 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 Reviewed general healthy eating and discussed pulmonary MNT - small fequent meals, eating protein foods first, adding fat to whatever she is eating, drinking in between meals.      Intervention Plan   Intervention Prescribe, educate and counsel regarding individualized specific dietary modifications aiming towards targeted core components such as weight, hypertension, lipid management, diabetes, heart failure and other comorbidities.;Nutrition handout(s) given to patient.    Expected Outcomes Short Term Goal: Understand basic principles of dietary content, such as  calories, fat, sodium, cholesterol and nutrients.;Short Term Goal: A plan has  been developed with personal nutrition goals set during dietitian appointment.;Long Term Goal: Adherence to prescribed nutrition plan.             Nutrition Discharge:   Education Questionnaire Score:  Knowledge Questionnaire Score - 08/09/21 1348       Knowledge Questionnaire Score   Pre Score 4/18    Post Score 15/18             Goals reviewed with patient; copy given to patient.

## 2021-08-16 NOTE — Progress Notes (Signed)
Daily Session Note  Patient Details  Name: Victoria Mendoza MRN: [id number] Date of Birth: 06/26/1956 Referring Provider:   Flowsheet Row Pulmonary Rehab from 05/15/2021 in [facility] Cardiac and Pulmonary Rehab  Referring Provider Megan Salon       Encounter Date: 08/16/2021  Check In:  Session Check In - 08/16/21 1351       Check-In   Supervising physician immediately available to respond to emergencies See telemetry face sheet for immediately available ER MD    Location [facility]-Cardiac & Pulmonary Rehab    Staff Present Birdie Sons, MPA, Nino Glow, MS, ASCM CEP, Exercise Physiologist;Joseph Tessie Fass, [place]    Virtual Visit No    Medication changes reported     No    Fall or balance concerns reported    No    Tobacco Cessation No Change    Warm-up and Cool-down Performed on first and last piece of equipment    Resistance Training Performed Yes    VAD Patient? No    PAD/SET Patient? No      Pain Assessment   Currently in Pain? No/denies                Social History   Tobacco Use  Smoking Status Never  Smokeless Tobacco Never    Goals Met:  Independence with exercise equipment Exercise tolerated well No report of concerns or symptoms today Strength training completed today  Goals Unmet:  Not Applicable  Comments:  Jessicamarie graduated today from  rehab with 36 sessions completed.  Details of the patient's exercise prescription and what She needs to do in order to continue the prescription and progress were discussed with patient.  Patient was given a copy of prescription and goals.  Patient verbalized understanding.  Alka plans to continue to exercise by using weights and the treadmill at home.    Dr. Emily Filbert is Medical Director for [facility].  Dr. Ottie Glazier is Medical Director for [facility] Pulmonary Rehabilitation.

## 2021-08-17 LAB — MYCOBACTERIA,CULT W/FLUOROCHROME SMEAR
MICRO NUMBER:: 12137442
SMEAR:: NONE SEEN
SPECIMEN QUALITY:: ADEQUATE

## 2021-08-17 NOTE — Telephone Encounter (Signed)
RN contacted [facility] to confirm no need for any additional information from office; confirmed that home health has been restarted and patient's first visit will be tomorrow 9/23 by RN.  Alynna Hargrove Loyola Mast, RN

## 2021-08-21 ENCOUNTER — Encounter: Payer: Self-pay | Admitting: Internal Medicine

## 2021-08-23 LAB — CBC WITH DIFF/PLATELET
HCT: 33 (ref 29–41)
Hemoglobin: 10.4
Lymphs Abs: 1.7
Neutrophils absolute (GR#): 11.9 10*3/uL — AB (ref 1.7–7.8)
Platelet: 456
RBC: 4.05 (ref 3.87–5.11)
WBC: 16.1

## 2021-08-29 ENCOUNTER — Other Ambulatory Visit: Payer: Self-pay | Admitting: Pharmacist

## 2021-09-05 ENCOUNTER — Telehealth: Payer: Self-pay

## 2021-09-05 NOTE — Telephone Encounter (Signed)
Received call from Dr. Mayford Knife with [facility] with intra-op results. Cultures from surgery showed aspergillus as did intraop pathology of tissues. Dr. Mayford Knife wanted to discuss treatment options for patient and see what modifications could be made to treat. Requesting Dr. Orvan Falconer call back to discuss options for patient. Forwarding to Dr. Orvan Falconer and pharmacy team to keep in loop.   Rosanna Randy, RN   Dr. Mayford Knife cell: [phone]

## 2021-09-07 ENCOUNTER — Ambulatory Visit: Payer: Medicare HMO | Admitting: Internal Medicine

## 2021-09-13 ENCOUNTER — Telehealth: Payer: Self-pay

## 2021-09-13 ENCOUNTER — Other Ambulatory Visit (HOSPITAL_COMMUNITY): Payer: Self-pay

## 2021-09-13 NOTE — Telephone Encounter (Signed)
[facility] Patient Advocate Encounter   Received notification from [facility]  that prior authorization for [name] is required.   PA submitted on 09/13/21 Key [id number] Status is pending    [facility] Clinic will continue to follow.   Clearance Coots, CPhT Specialty Pharmacy Patient [facility] for Infectious Disease Phone: [phone] Fax:  [phone]

## 2021-09-13 NOTE — Telephone Encounter (Signed)
[facility] provider called to discuss patient with Dr. Orvan Falconer; requested a call back to discuss next steps after discharge and management of outpatient antibiotics + labs.   He can be reached at [phone]; Dr. Hewitt Blade.  Roslyn Else Loyola Mast, RN

## 2021-09-14 ENCOUNTER — Other Ambulatory Visit (HOSPITAL_COMMUNITY): Payer: Self-pay

## 2021-09-14 NOTE — Telephone Encounter (Signed)
Prior authorization was approved and is good until 11/25/2022. Placed on Donna's desk. Thank you!

## 2021-09-19 ENCOUNTER — Telehealth: Payer: Self-pay

## 2021-09-19 ENCOUNTER — Other Ambulatory Visit (HOSPITAL_COMMUNITY): Payer: Self-pay

## 2021-09-19 NOTE — Telephone Encounter (Signed)
[facility] Patient Advocate Encounter  Prior Authorization for Victoria Mendoza has been approved.    PA# [id number] Effective dates: 09/14/21 through 11/25/22  Patients co-pay is $0.00.   [facility] will continue to follow.  Clearance Coots, CPhT Specialty Pharmacy Patient [facility] for Infectious Disease Phone: [phone] Fax:  [phone]

## 2021-09-20 NOTE — Telephone Encounter (Signed)
Called patient to schedule appointment, no answer. Left HIPAA compliant voicemail requesting callback.   Hasset Chaviano D Huriel Matt, RN  

## 2021-09-21 NOTE — Telephone Encounter (Signed)
Called patient again to schedule, no answer. Voicemail left with previous attempt.   Sandie Ano, RN

## 2021-09-22 NOTE — Telephone Encounter (Signed)
Patient returned called and accepts video visit next week with Dr. Orvan Falconer.

## 2021-09-22 NOTE — Telephone Encounter (Signed)
Attempted to contact patient again. Left HIPPA  compliant voicemail to contact our office.  Valarie Cones

## 2021-09-27 ENCOUNTER — Telehealth (INDEPENDENT_AMBULATORY_CARE_PROVIDER_SITE_OTHER): Payer: Medicare HMO | Admitting: Internal Medicine

## 2021-09-27 ENCOUNTER — Other Ambulatory Visit: Payer: Self-pay

## 2021-09-27 ENCOUNTER — Telehealth: Payer: Self-pay | Admitting: Pharmacist

## 2021-09-27 DIAGNOSIS — A318 Other mycobacterial infections: Secondary | ICD-10-CM | POA: Diagnosis not present

## 2021-09-27 DIAGNOSIS — B449 Aspergillosis, unspecified: Secondary | ICD-10-CM | POA: Diagnosis not present

## 2021-09-27 NOTE — Progress Notes (Signed)
Virtual Visit via Telephone Note  I connected with Darletta Moll on 09/27/21 at 10:30 AM EDT by telephone and verified that I am speaking with the correct person using two identifiers.  Location: Patient: [facility] ED Provider: [facility]   I discussed the limitations, risks, security and privacy concerns of performing an evaluation and management service by telephone and the availability of in person appointments. I also discussed with the patient that there may be a patient responsible charge related to this service. The patient expressed understanding and agreed to proceed.   History of Present Illness: I called and spoke with Sanja by phone today.  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  Operative pathology showed necrotizing granulomatous inflammation with abscess formation and intraparenchymal septate hyphae compatible with Aspergillus.  Special stains for AFB were negative.  Operative cultures grew Aspergillus fumigatus.  It does not appear that AFB cultures were obtained.  I had several discussions with her ID providers while she was hospitalized.  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  Because of potential adverse drug drug interactions her rifampin was stopped.  She was continued on azithromycin, ethambutol, IV amikacin, IV tigecycline and IV cefoxitin.  She says that she has had a very difficult time since her surgery.  She told me that she was not given an oxygen tank after discharge.  She has been receiving continuous nasal prong oxygen at 2.5 L/min.  Yesterday she experienced worsening dyspnea and return to the emergency department at [facility].  She told me on the phone today that she has been discharged from the emergency department as we spoke.  ED notes indicate that her oxygen saturation was 96% on 4 L of  supplemental oxygen.  There was also concern that her PICC line may have been partially clotted.  She received alteplase through the PICC.   Observations/Objective:   Assessment and Plan: Her clinical situation has gotten even more complicated with the findings of co-infection with Aspergillus.  Follow Up Instructions: Arrange a phone follow-up visit tomorrow when she is back home I have asked my pharmacist to review all of her IV antibiotic orders with [facility]   I discussed the assessment and treatment plan with the patient. The patient was provided an opportunity to ask questions and all were answered. The patient agreed with the plan and demonstrated an understanding of the instructions.   The patient was advised to call back or seek an in-person evaluation if the symptoms worsen or if the condition fails to improve as anticipated.  I provided 15 minutes of non-face-to-face time during this encounter.   Cliffton Asters, MD

## 2021-09-27 NOTE — Telephone Encounter (Signed)
Spoke with Hospital doctor at [facility] to confirm that OPAT orders have remained the same since patient was recently discharged from [facility]. Patient continues on IV amikacin, tigecycline, and cefoxitin (along with oral azithromycin, ethambutol, and Cresemba). Stop dates dependent on Dr. Blair Dolphin orders.  Margarite Gouge, PharmD, CPP Clinical Pharmacist Practitioner Infectious Diseases Clinical Pharmacist [facility] for Infectious Disease

## 2021-09-28 ENCOUNTER — Other Ambulatory Visit: Payer: Self-pay

## 2021-09-28 ENCOUNTER — Ambulatory Visit (INDEPENDENT_AMBULATORY_CARE_PROVIDER_SITE_OTHER): Payer: Medicare HMO | Admitting: Internal Medicine

## 2021-09-28 DIAGNOSIS — B449 Aspergillosis, unspecified: Secondary | ICD-10-CM

## 2021-09-28 NOTE — Progress Notes (Signed)
Virtual Visit via Telephone Note  I connected with Victoria Mendoza on 09/28/21 at 11:00 AM EDT by telephone and verified that I am speaking with the correct person using two identifiers.  Location: Patient: [facility] ED Provider: [facility]   I discussed the limitations, risks, security and privacy concerns of performing an evaluation and management service by telephone and the availability of in person appointments. I also discussed with the patient that there may be a patient responsible charge related to this service. The patient expressed understanding and agreed to proceed.   History of Present Illness: I called and spoke with Victoria Mendoza again today.  She remains in the emergency department at [facility] where she returned 2 days ago following her recent left upper lobectomy.  Notes indicate that she has had some recent purulent drainage and air coming from her chest wound.  She says that the drainage has improved recently.  She has not had any fever recently.  She feels like she has been tolerating her very complicated antibiotic regimen.  She says that they are working on the skilled nursing facility placement for her.   Observations/Objective: Left upper lobe pathology showed necrotizing granulomatous inflammation with abscess formation and intraparenchymal septate hyphae compatible with Aspergillus.  AFB stain was negative. Operative cultures grew Aspergillus fumigatus I do not find evidence that any AFB cultures were obtained  Assessment and Plan: She will continue on her current regimen of oral azithromycin, oral ethambutol, oral isuvaconazole and IV amikacin, tigecycline and cefoxitin.  She will probably need at least 6 months of isavuconazole for invasive aspergillus pneumonia.  I will need to talk to Dr. Valentina Lucks at [facility] to help determine optimal duration of her mycobacterial therapy.  Follow Up Instructions: We will follow-up with her in 1 month I will ask our pharmacist to  verify antibiotic orders at her skilled nursing facility when she has placed   I discussed the assessment and treatment plan with the patient. The patient was provided an opportunity to ask questions and all were answered. The patient agreed with the plan and demonstrated an understanding of the instructions.   The patient was advised to call back or seek an in-person evaluation if the symptoms worsen or if the condition fails to improve as anticipated.  I provided 16 minutes of non-face-to-face time during this encounter.   Cliffton Asters, MD

## 2021-10-02 ENCOUNTER — Telehealth: Payer: Self-pay | Admitting: Pharmacist

## 2021-10-02 NOTE — Telephone Encounter (Signed)
Per Care Everywhere chart records from [facility], patient passed away early morning on 22-Oct-2021 due to transitioning to comfort measures. Patient's condition rapidly declined late last week while at [facility] where she was transferred to the SICU. Due to declining conditions, family agreed to her DNR/DNI wishes, and she was transitioning to comfort measures. Cancelled future appointment with Dr. Orvan Falconer on 11/01/21.   Margarite Gouge, PharmD, CPP Clinical Pharmacist Practitioner Infectious Diseases Clinical Pharmacist [facility] for Infectious Disease

## 2021-10-03 IMAGING — CT CT ANGIO CHEST
2 of 6 series · 19 of 36 positions shown · IV contrast (omnipaque)
Comparison: 04/15/2020

CLINICAL DATA: Shortness of breath

EXAM:
CT ANGIOGRAPHY CHEST WITH CONTRAST
TECHNIQUE: Multidetector CT imaging of the chest was performed using the
standard protocol during bolus administration of intravenous
contrast. Multiplanar CT image reconstructions and MIPs were
obtained to evaluate the vascular anatomy.
CONTRAST:  75mL OMNIPAQUE IOHEXOL 350 MG/ML SOLN

[Series 7: pe thins · axial · 0.63mm/px · z∈[+1088,+1363]mm · 18 of 438 slices shown]
[im 22/438  lung]
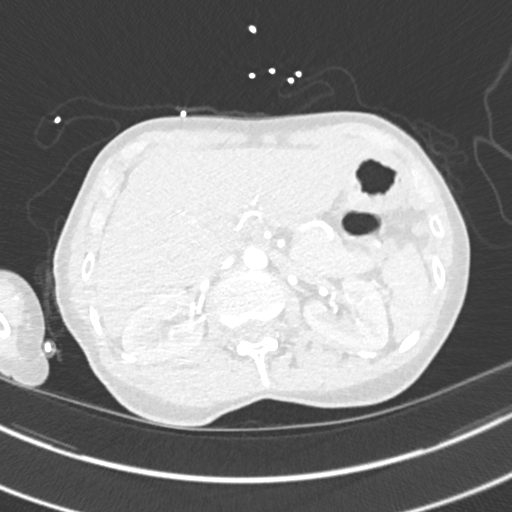
[im 44/438  mediastinal]
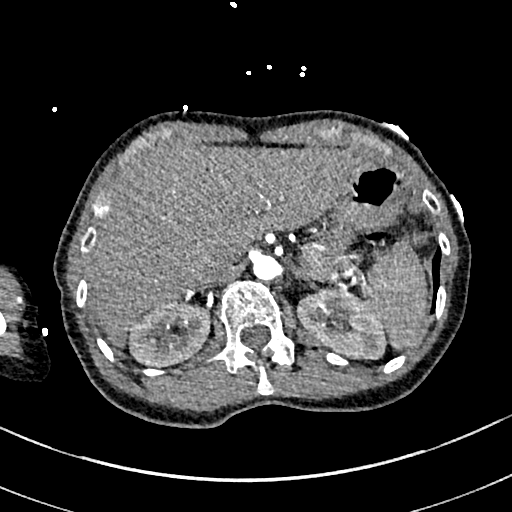
[im 66/438  lung]
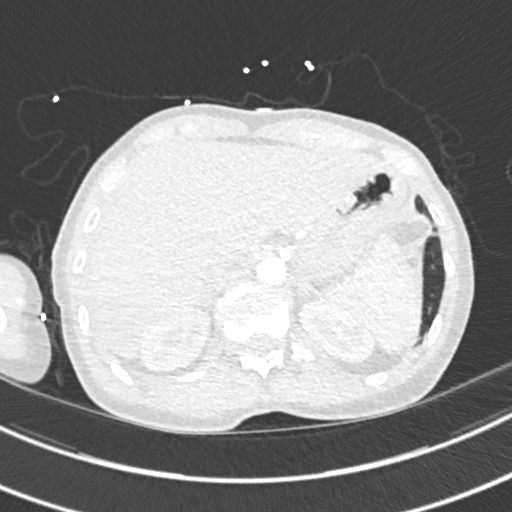
[im 88/438  mediastinal]
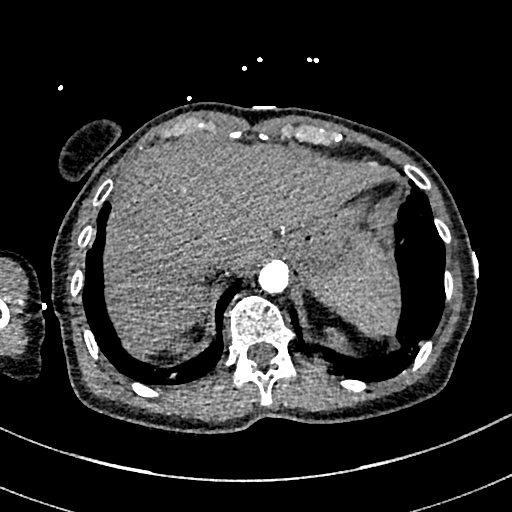
[im 110/438  lung]
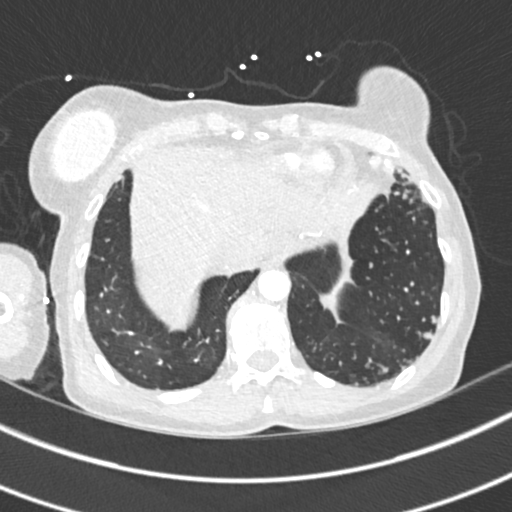
[im 132/438  mediastinal]
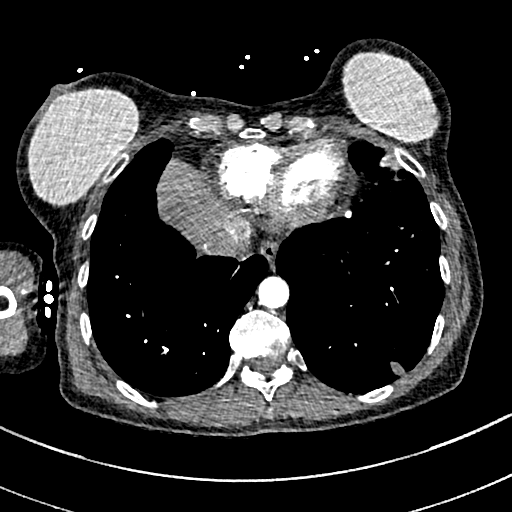
[im 153/438  lung]
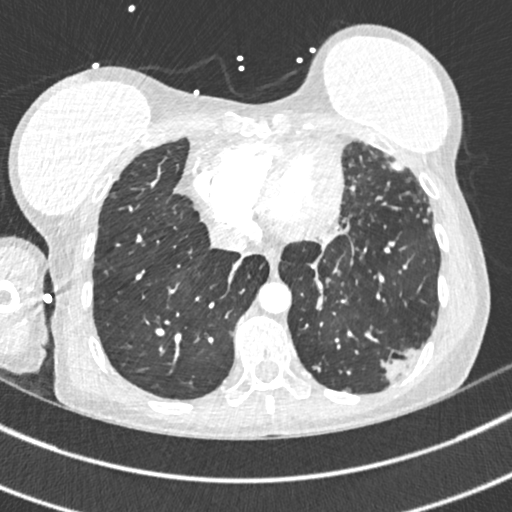
[im 175/438  mediastinal]
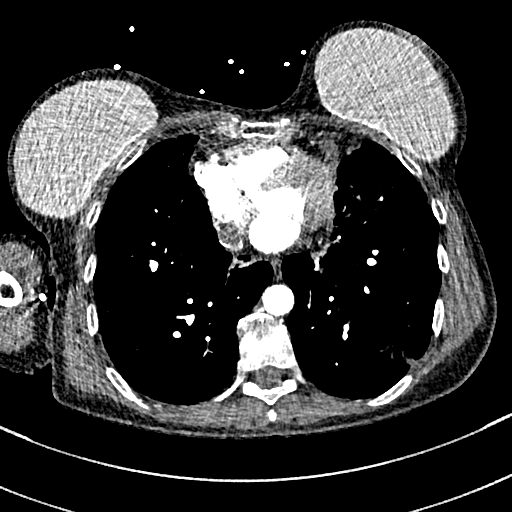
[im 197/438  lung]
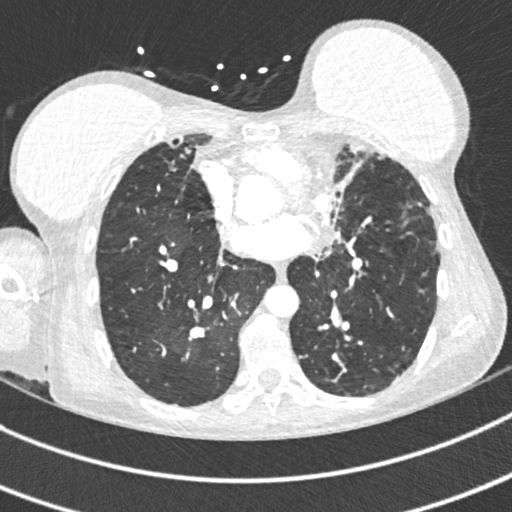
[im 241/438  mediastinal]
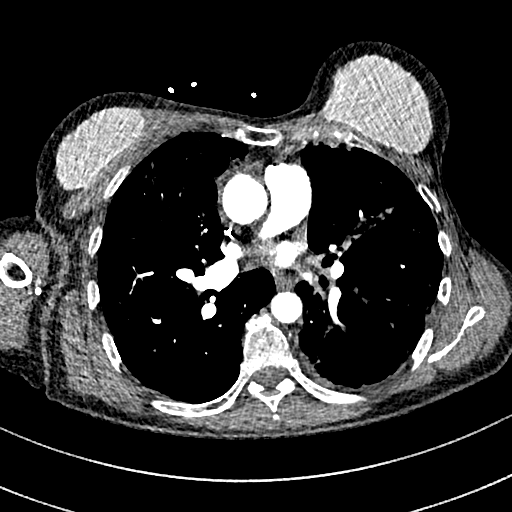
[im 263/438  lung]
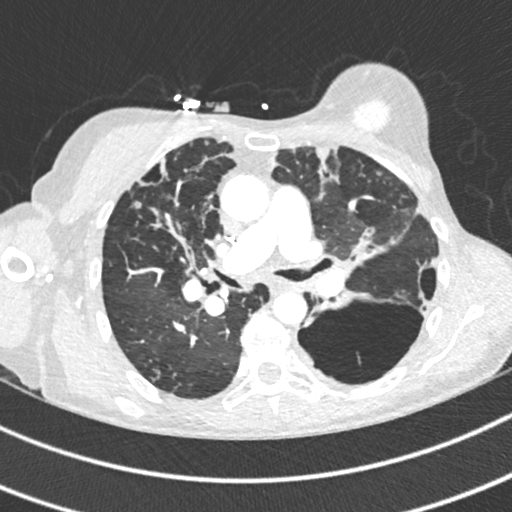
[im 285/438  mediastinal]
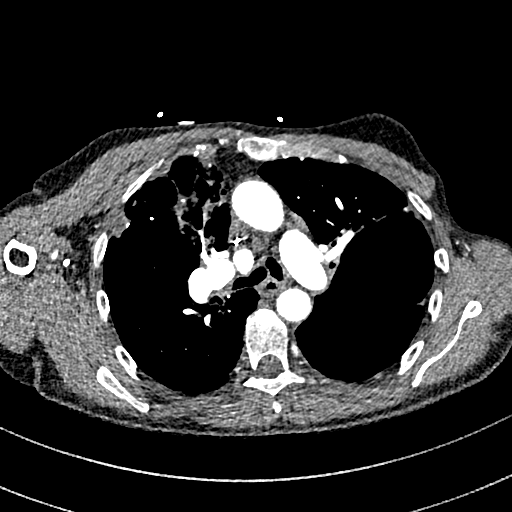
[im 306/438  lung]
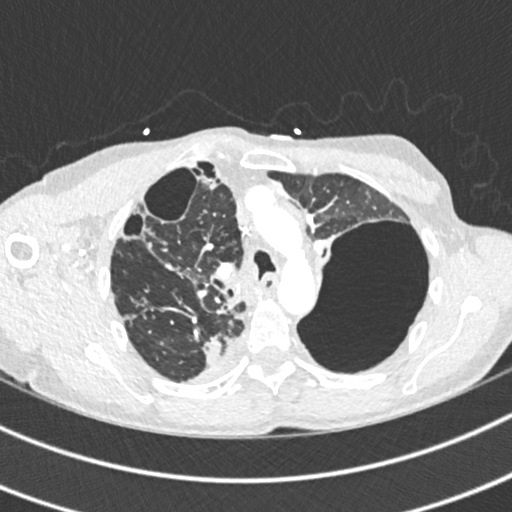
[im 328/438  mediastinal]
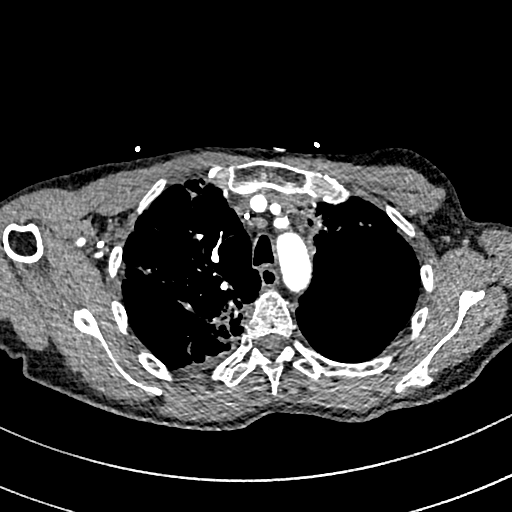
[im 350/438  lung]
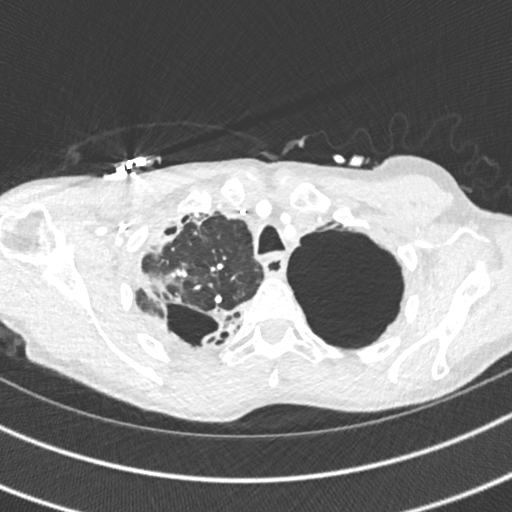
[im 372/438  mediastinal]
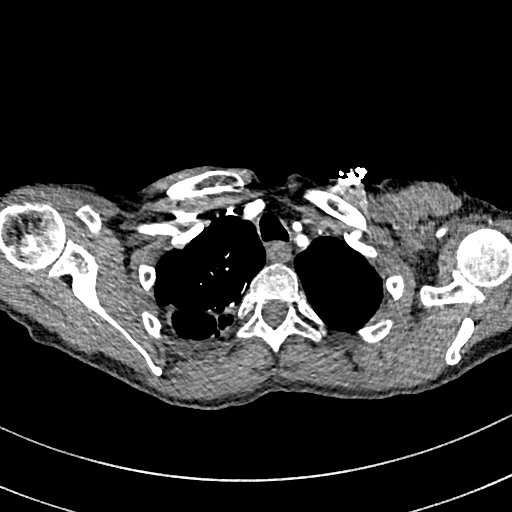
[im 394/438  lung]
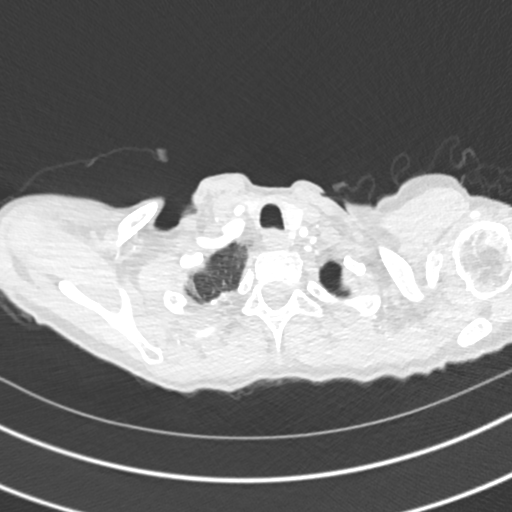
[im 416/438  mediastinal]
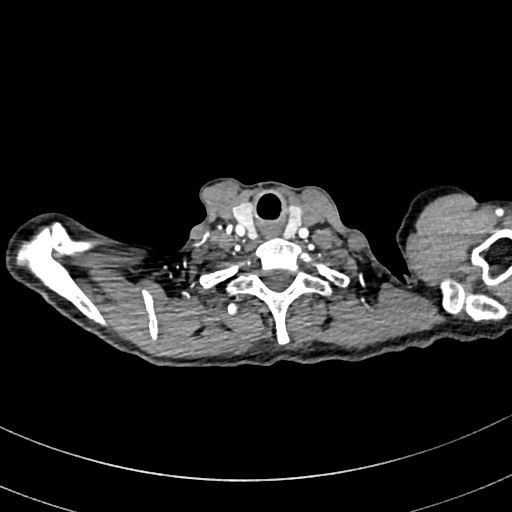

[Series 8: pe 2mm cor · coronal · 0.52mm/px · 1 of 151 slices shown]
[im 76/151  mediastinal]
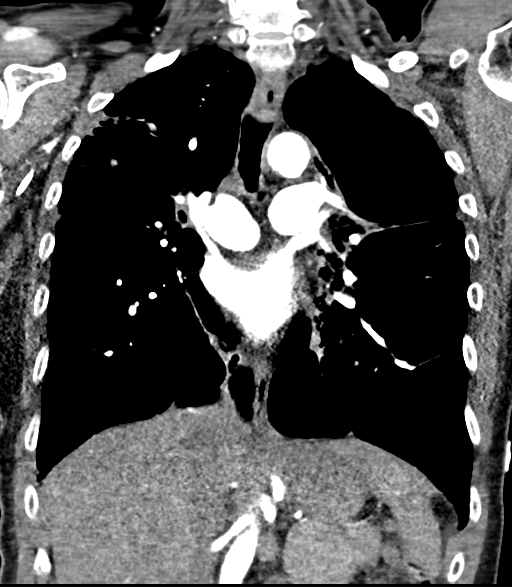

[19 of 36 positions shown; findings below may reference images not displayed]

FINDINGS: Cardiovascular: No filling defects in the pulmonary arteries to
suggest pulmonary emboli. Heart is normal size. Aorta is normal
caliber. Coronary artery and aortic calcifications.

Mediastinum/Nodes: No mediastinal, hilar, or axillary adenopathy.
Trachea and esophagus are unremarkable. Thyroid unremarkable.

Lungs/Pleura: Severe bullous emphysema, bronchiectasis and scarring
throughout the lungs, most pronounced in the upper lung zones.
Extensive nodular densities in the left lower lobe. Findings are
stable since prior study. No effusions or acute infiltrates.

Upper Abdomen: Imaging into the upper abdomen demonstrates no acute
findings.

Musculoskeletal: Bilateral breast implants noted. Otherwise chest
wall soft tissues are unremarkable. No acute bony abnormality.

Review of the MIP images confirms the above findings.
IMPRESSION: Bullous disease or cavitary lesions in the upper lobes bilaterally,
stable since prior study. Extensive bronchiectasis, scarring and
nodular disease throughout both lungs. Findings most compatible with
chronic atypical infection such as mycobacterium avium.

No pulmonary embolus.

Coronary artery disease.

No acute cardiopulmonary disease.

Aortic Atherosclerosis ([id number]).

## 2021-10-26 ENCOUNTER — Ambulatory Visit: Payer: Medicare HMO | Admitting: Internal Medicine

## 2021-10-26 DEATH — deceased

## 2022-01-28 IMAGING — XA IR REPLACE PICC
1 series · 2 of 2 positions shown · non-contrast
Comparison: none

INDICATION: History with mycobacterium infection in need of long-term IV
antibiotics. Previously placed PICC line no longer functioning.
Request for PICC line exchange.

EXAM:
FLUOROSCOPIC GUIDED PICC LINE EXCHANGE
TECHNIQUE: The procedure, risks, benefits, and alternatives were explained to
the patient and informed written consent was obtained. The right
upper extremity and external portion of the existing PICC line was
prepped with chlorhexidine in a sterile fashion, and a sterile drape
was applied covering the operative field. Maximum barrier sterile
technique with sterile gowns and gloves were used for the procedure.
A timeout was performed prior to the initiation of the procedure.
Local anesthesia was provided with 1% lidocaine.

[Series 1: fl (-) angio · 2 of 2 slices shown]
[im 1/2]
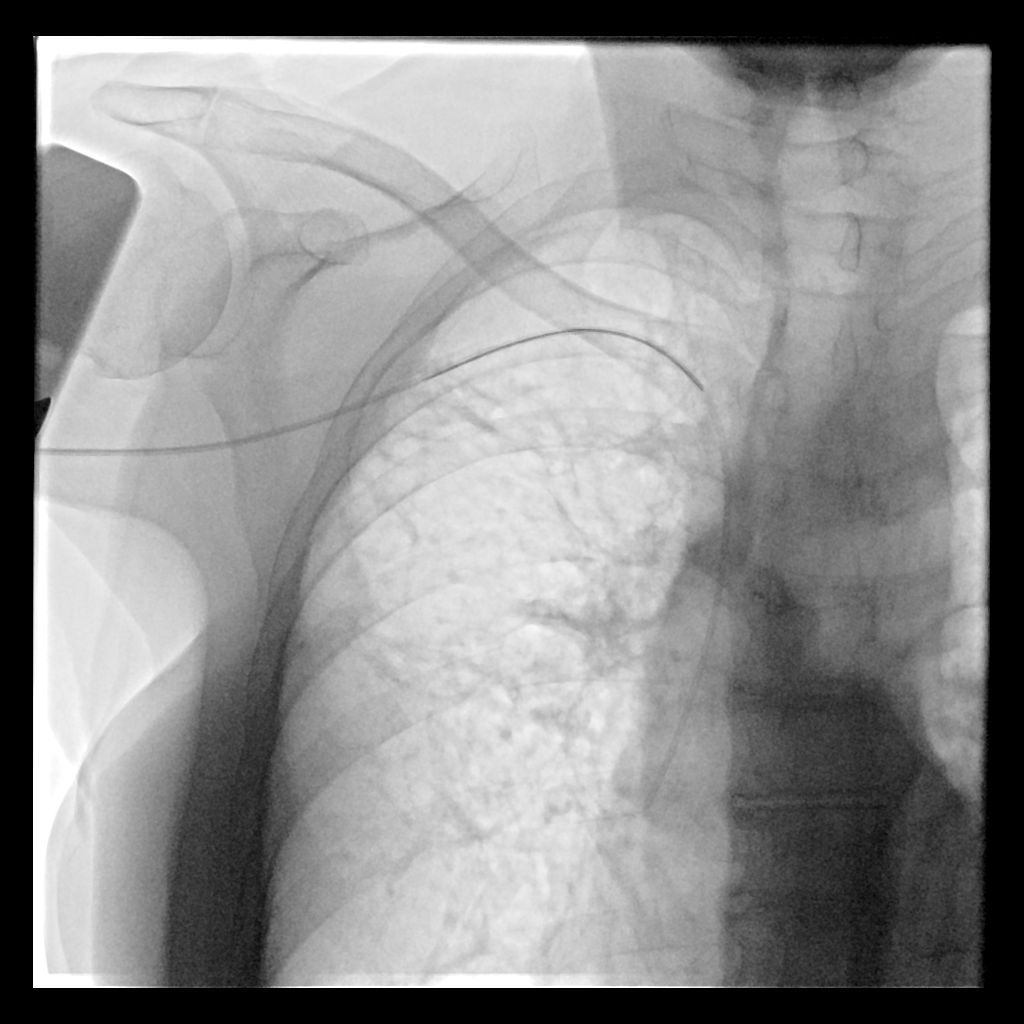
[im 2/2]
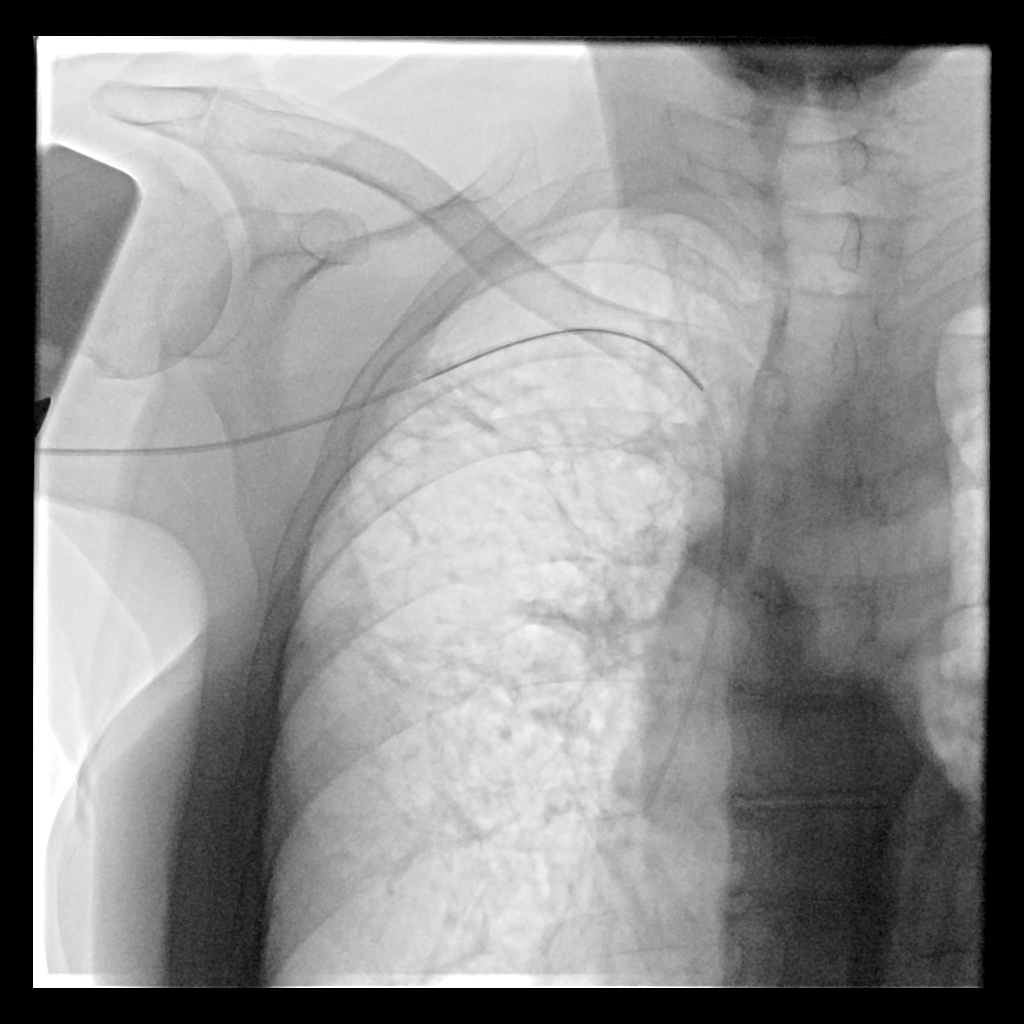

[2 of 2 positions shown; findings below may reference images not displayed]

The existing PICC line was cannulated with an 0.0018 wire which was
advanced through the catheter. The catheter was exchanged for a
peel-away sheath, ultimately allowing advancement of a 34-cm, 5-
French, single lumen PICC line to the level of the superior caval
atrial junction. A post procedure spot fluoroscopic image was
obtained. The catheter easily aspirated and flushed and was sutured
in place. A dressing was placed. The patient tolerated the procedure
well without immediate post procedural complication.
FINDINGS: After catheter exchange, the tip lies within the superior cavoatrial
junction. The catheter aspirates and flushes normally and is ready
for immediate use.
IMPRESSION: Successful fluoroscopic guided exchange of right upper extremity
approach 34 cm, 5 - French, single lumen PICC with tip overlying the
superior caval atrial junction. The PICC line is ready for immediate
use.
